# Patient Record
Sex: Female | Born: 1960 | Race: White | Hispanic: No | Smoking: Current every day smoker
Health system: Southern US, Community
[De-identification: ages and names within clinical notes are randomized; demographics above are authoritative.]

## PROBLEM LIST (undated history)

## (undated) DIAGNOSIS — Z720 Tobacco use: Secondary | ICD-10-CM

## (undated) DIAGNOSIS — M1990 Unspecified osteoarthritis, unspecified site: Secondary | ICD-10-CM

## (undated) DIAGNOSIS — Z5189 Encounter for other specified aftercare: Secondary | ICD-10-CM

## (undated) DIAGNOSIS — E785 Hyperlipidemia, unspecified: Secondary | ICD-10-CM

## (undated) DIAGNOSIS — I739 Peripheral vascular disease, unspecified: Secondary | ICD-10-CM

## (undated) DIAGNOSIS — G4730 Sleep apnea, unspecified: Secondary | ICD-10-CM

## (undated) DIAGNOSIS — E039 Hypothyroidism, unspecified: Secondary | ICD-10-CM

## (undated) DIAGNOSIS — K219 Gastro-esophageal reflux disease without esophagitis: Secondary | ICD-10-CM

## (undated) DIAGNOSIS — I10 Essential (primary) hypertension: Secondary | ICD-10-CM

## (undated) DIAGNOSIS — I2510 Atherosclerotic heart disease of native coronary artery without angina pectoris: Secondary | ICD-10-CM

## (undated) DIAGNOSIS — I509 Heart failure, unspecified: Secondary | ICD-10-CM

## (undated) DIAGNOSIS — D649 Anemia, unspecified: Secondary | ICD-10-CM

## (undated) HISTORY — DX: Gastro-esophageal reflux disease without esophagitis: K21.9

## (undated) HISTORY — DX: Sleep apnea, unspecified: G47.30

## (undated) HISTORY — DX: Heart failure, unspecified: I50.9

## (undated) HISTORY — DX: Atherosclerotic heart disease of native coronary artery without angina pectoris: I25.10

## (undated) HISTORY — DX: Tobacco use: Z72.0

## (undated) HISTORY — PX: COLONOSCOPY: SHX174

## (undated) HISTORY — DX: Encounter for other specified aftercare: Z51.89

## (undated) HISTORY — DX: Peripheral vascular disease, unspecified: I73.9

## (undated) HISTORY — DX: Essential (primary) hypertension: I10

## (undated) HISTORY — DX: Hyperlipidemia, unspecified: E78.5

## (undated) HISTORY — DX: Anemia, unspecified: D64.9

## (undated) HISTORY — DX: Hypothyroidism, unspecified: E03.9

## (undated) HISTORY — DX: Unspecified osteoarthritis, unspecified site: M19.90

---

## 2001-08-27 ENCOUNTER — Emergency Department (HOSPITAL_COMMUNITY): Admission: EM | Admit: 2001-08-27 | Discharge: 2001-08-27 | Payer: Self-pay | Admitting: Emergency Medicine

## 2006-08-23 ENCOUNTER — Emergency Department (HOSPITAL_COMMUNITY): Admission: EM | Admit: 2006-08-23 | Discharge: 2006-08-23 | Payer: Self-pay | Admitting: Emergency Medicine

## 2006-08-24 ENCOUNTER — Inpatient Hospital Stay (HOSPITAL_COMMUNITY): Admission: AD | Admit: 2006-08-24 | Discharge: 2006-08-29 | Payer: Self-pay | Admitting: *Deleted

## 2006-08-24 ENCOUNTER — Emergency Department (HOSPITAL_COMMUNITY): Admission: EM | Admit: 2006-08-24 | Discharge: 2006-08-25 | Payer: Self-pay | Admitting: Emergency Medicine

## 2006-08-24 ENCOUNTER — Ambulatory Visit: Payer: Self-pay | Admitting: *Deleted

## 2006-09-05 ENCOUNTER — Ambulatory Visit (HOSPITAL_COMMUNITY): Admission: RE | Admit: 2006-09-05 | Discharge: 2006-09-05 | Payer: Self-pay | Admitting: Family Medicine

## 2006-09-09 ENCOUNTER — Emergency Department (HOSPITAL_COMMUNITY): Admission: EM | Admit: 2006-09-09 | Discharge: 2006-09-09 | Payer: Self-pay | Admitting: Emergency Medicine

## 2006-09-13 ENCOUNTER — Ambulatory Visit (HOSPITAL_COMMUNITY): Payer: Self-pay | Admitting: Psychology

## 2006-09-26 ENCOUNTER — Ambulatory Visit (HOSPITAL_COMMUNITY): Payer: Self-pay | Admitting: Psychology

## 2006-10-07 ENCOUNTER — Ambulatory Visit (HOSPITAL_COMMUNITY): Admission: RE | Admit: 2006-10-07 | Discharge: 2006-10-07 | Payer: Self-pay | Admitting: Orthopaedic Surgery

## 2007-08-30 DIAGNOSIS — Z5189 Encounter for other specified aftercare: Secondary | ICD-10-CM

## 2007-08-30 DIAGNOSIS — IMO0001 Reserved for inherently not codable concepts without codable children: Secondary | ICD-10-CM

## 2007-08-30 HISTORY — DX: Encounter for other specified aftercare: Z51.89

## 2007-08-30 HISTORY — DX: Reserved for inherently not codable concepts without codable children: IMO0001

## 2007-09-11 ENCOUNTER — Ambulatory Visit: Payer: Self-pay | Admitting: *Deleted

## 2007-09-12 ENCOUNTER — Encounter: Payer: Self-pay | Admitting: Cardiology

## 2007-09-12 ENCOUNTER — Ambulatory Visit: Payer: Self-pay | Admitting: Pulmonary Disease

## 2007-09-12 ENCOUNTER — Inpatient Hospital Stay (HOSPITAL_COMMUNITY): Admission: EM | Admit: 2007-09-12 | Discharge: 2007-09-25 | Payer: Self-pay | Admitting: Cardiovascular Disease

## 2007-09-12 ENCOUNTER — Ambulatory Visit: Payer: Self-pay | Admitting: Cardiology

## 2007-09-19 ENCOUNTER — Encounter: Payer: Self-pay | Admitting: Cardiology

## 2007-09-21 ENCOUNTER — Encounter: Payer: Self-pay | Admitting: Cardiology

## 2007-09-22 ENCOUNTER — Encounter: Payer: Self-pay | Admitting: Cardiology

## 2007-09-22 ENCOUNTER — Ambulatory Visit: Payer: Self-pay | Admitting: Vascular Surgery

## 2007-09-25 ENCOUNTER — Encounter: Payer: Self-pay | Admitting: Cardiovascular Disease

## 2007-10-04 ENCOUNTER — Ambulatory Visit: Payer: Self-pay | Admitting: Cardiovascular Disease

## 2008-06-26 ENCOUNTER — Ambulatory Visit: Payer: Self-pay | Admitting: Cardiology

## 2008-06-27 ENCOUNTER — Encounter: Payer: Self-pay | Admitting: Cardiology

## 2008-06-27 ENCOUNTER — Ambulatory Visit: Payer: Self-pay | Admitting: Cardiology

## 2008-06-27 ENCOUNTER — Ambulatory Visit (HOSPITAL_COMMUNITY): Admission: RE | Admit: 2008-06-27 | Discharge: 2008-06-27 | Payer: Self-pay | Admitting: Cardiology

## 2008-09-19 ENCOUNTER — Ambulatory Visit: Payer: Self-pay | Admitting: Cardiology

## 2008-10-04 ENCOUNTER — Encounter (INDEPENDENT_AMBULATORY_CARE_PROVIDER_SITE_OTHER): Payer: Self-pay | Admitting: *Deleted

## 2008-10-04 LAB — CONVERTED CEMR LAB
BUN: 20 mg/dL
CO2: 24 meq/L
Creatinine, Ser: 0.86 mg/dL
Glucose, Bld: 79 mg/dL
Potassium: 3.7 meq/L

## 2008-10-08 ENCOUNTER — Encounter: Payer: Self-pay | Admitting: Cardiology

## 2008-10-11 ENCOUNTER — Encounter (INDEPENDENT_AMBULATORY_CARE_PROVIDER_SITE_OTHER): Payer: Self-pay | Admitting: *Deleted

## 2008-10-18 ENCOUNTER — Encounter (INDEPENDENT_AMBULATORY_CARE_PROVIDER_SITE_OTHER): Payer: Self-pay | Admitting: *Deleted

## 2009-01-09 ENCOUNTER — Encounter (INDEPENDENT_AMBULATORY_CARE_PROVIDER_SITE_OTHER): Payer: Self-pay | Admitting: *Deleted

## 2009-01-09 LAB — CONVERTED CEMR LAB
ALT: 73 units/L
AST: 45 units/L
Albumin: 4.3 g/dL
Alkaline Phosphatase: 59 units/L
CO2: 21 meq/L
Cholesterol: 192 mg/dL
Glucose, Bld: 106 mg/dL
HDL: 65 mg/dL
Sodium: 137 meq/L
Total Protein: 7.7 g/dL

## 2009-04-10 IMAGING — CR DG CHEST 1V PORT
1 series · 1 of 1 positions shown · non-contrast
Comparison: Chest radiograph 09/13/2007 at [DATE]

CLINICAL DATA: PICC line placement, pulmonary edema.

PORTABLE CHEST - 1 VIEW

[view not recorded]
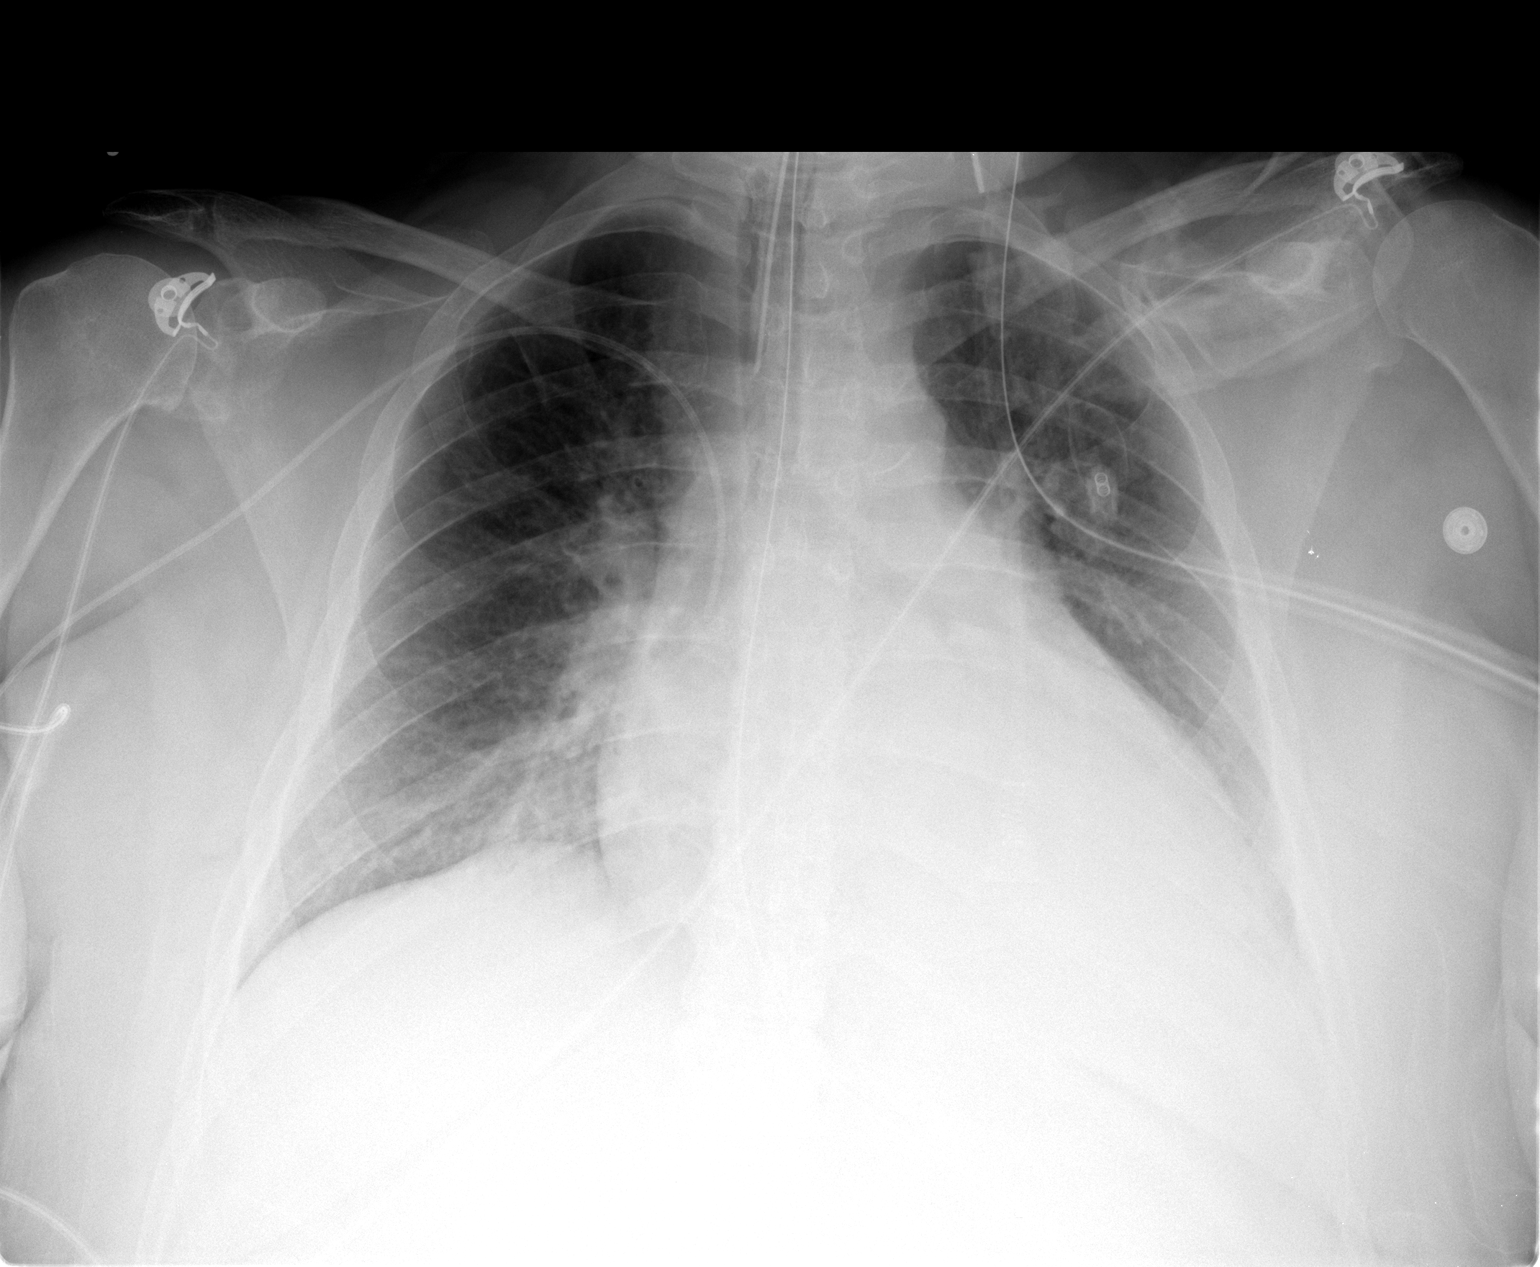

[1 of 1 positions shown; findings below may reference images not displayed]

FINDINGS: Interval placement of right PICC line with tip in the
distal SVC.  Endotracheal tube and NG tube are unchanged.  Stable
enlarged cardiac silhouette.  There is left retrocardiac opacity
representing atelectasis and/or effusion.  Improved central
pulmonary edema.
IMPRESSION: 1..  Interval placement of right PICC line with tip in the distal
SVC.
2..  Improved pulmonary edema.

## 2009-04-10 IMAGING — CR DG CHEST 1V PORT
1 series · 1 of 1 positions shown · non-contrast
Comparison: Portable chest 09/12/2007.

CLINICAL DATA: Acute pulmonary edema.

PORTABLE CHEST - 1 VIEW

[AP]
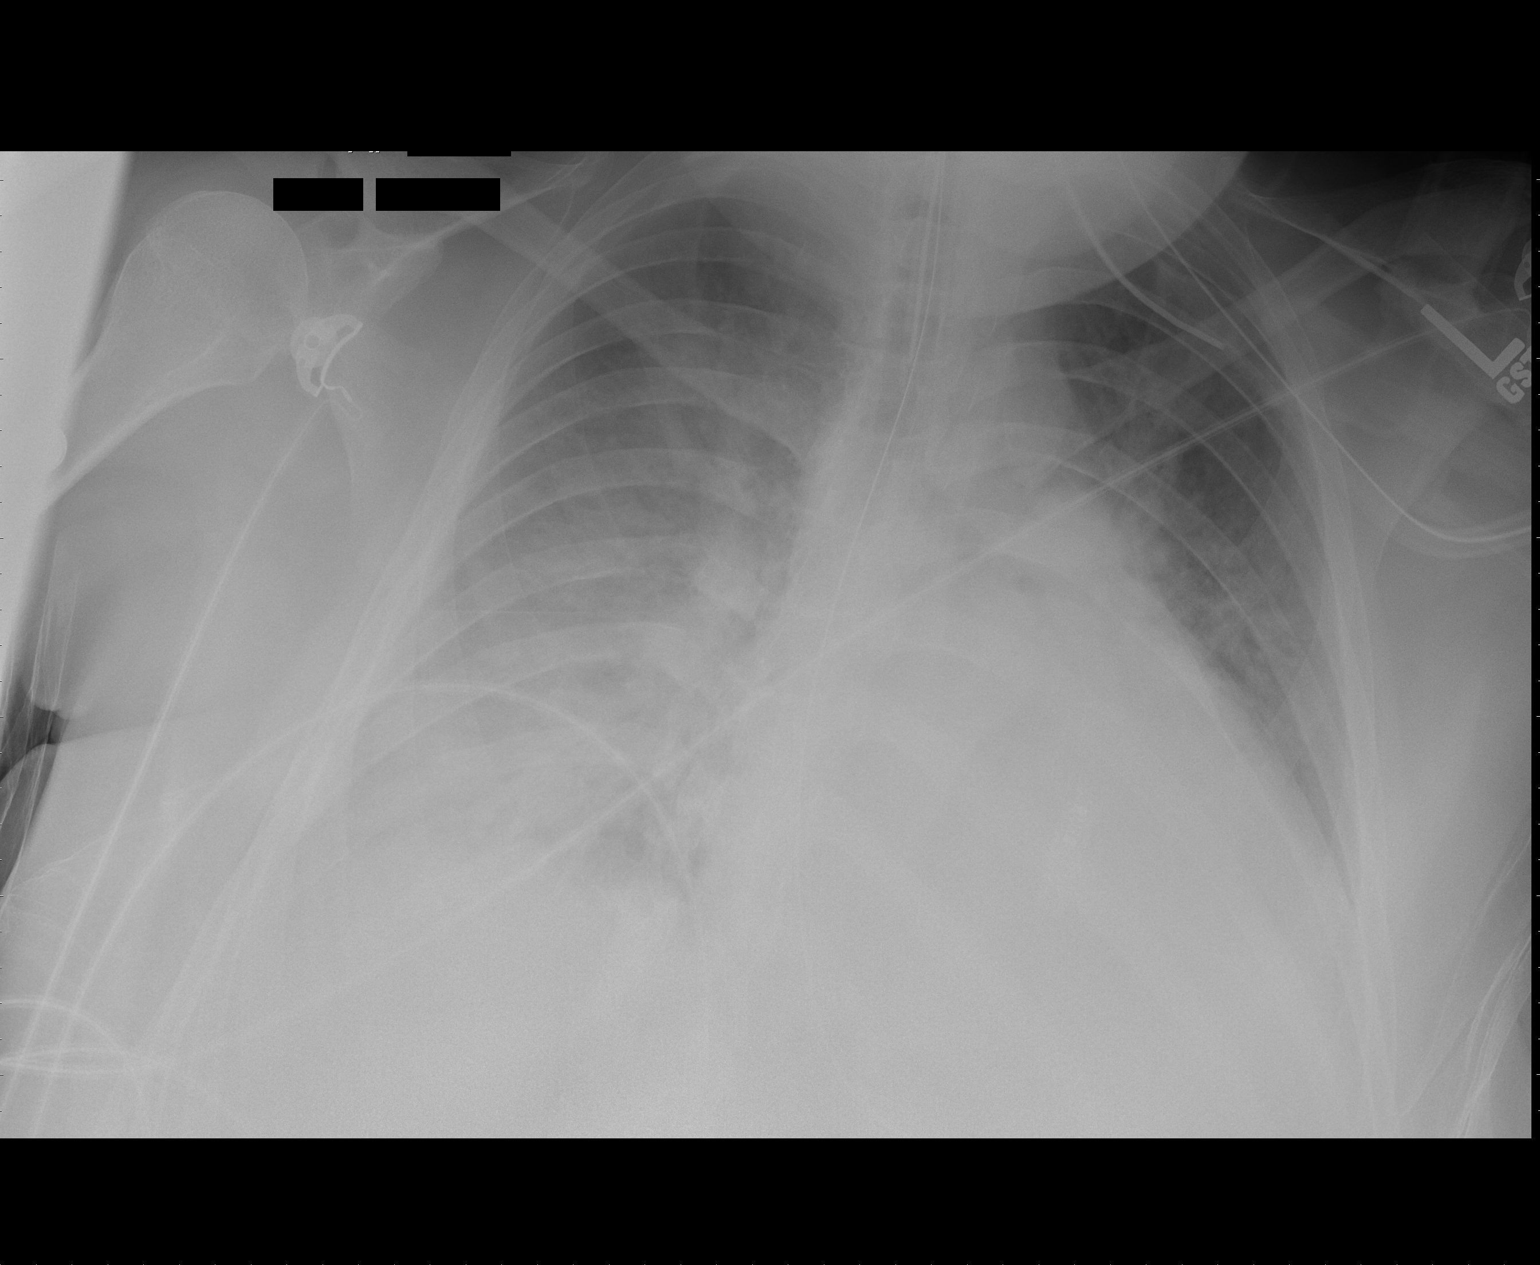

[1 of 1 positions shown; findings below may reference images not displayed]

FINDINGS: Support apparatus is unchanged.  There is been interval
increase in bilateral pleural effusions and airspace disease.  Left
basilar atelectasis again noted.
IMPRESSION: Increasing edema, effusions and atelectasis.

## 2009-04-13 IMAGING — CR DG CHEST 1V PORT
1 series · 1 of 1 positions shown · non-contrast
Comparison: 09/14/2007

CLINICAL DATA: Pulmonary edema

PORTABLE CHEST - 1 VIEW

[AP]
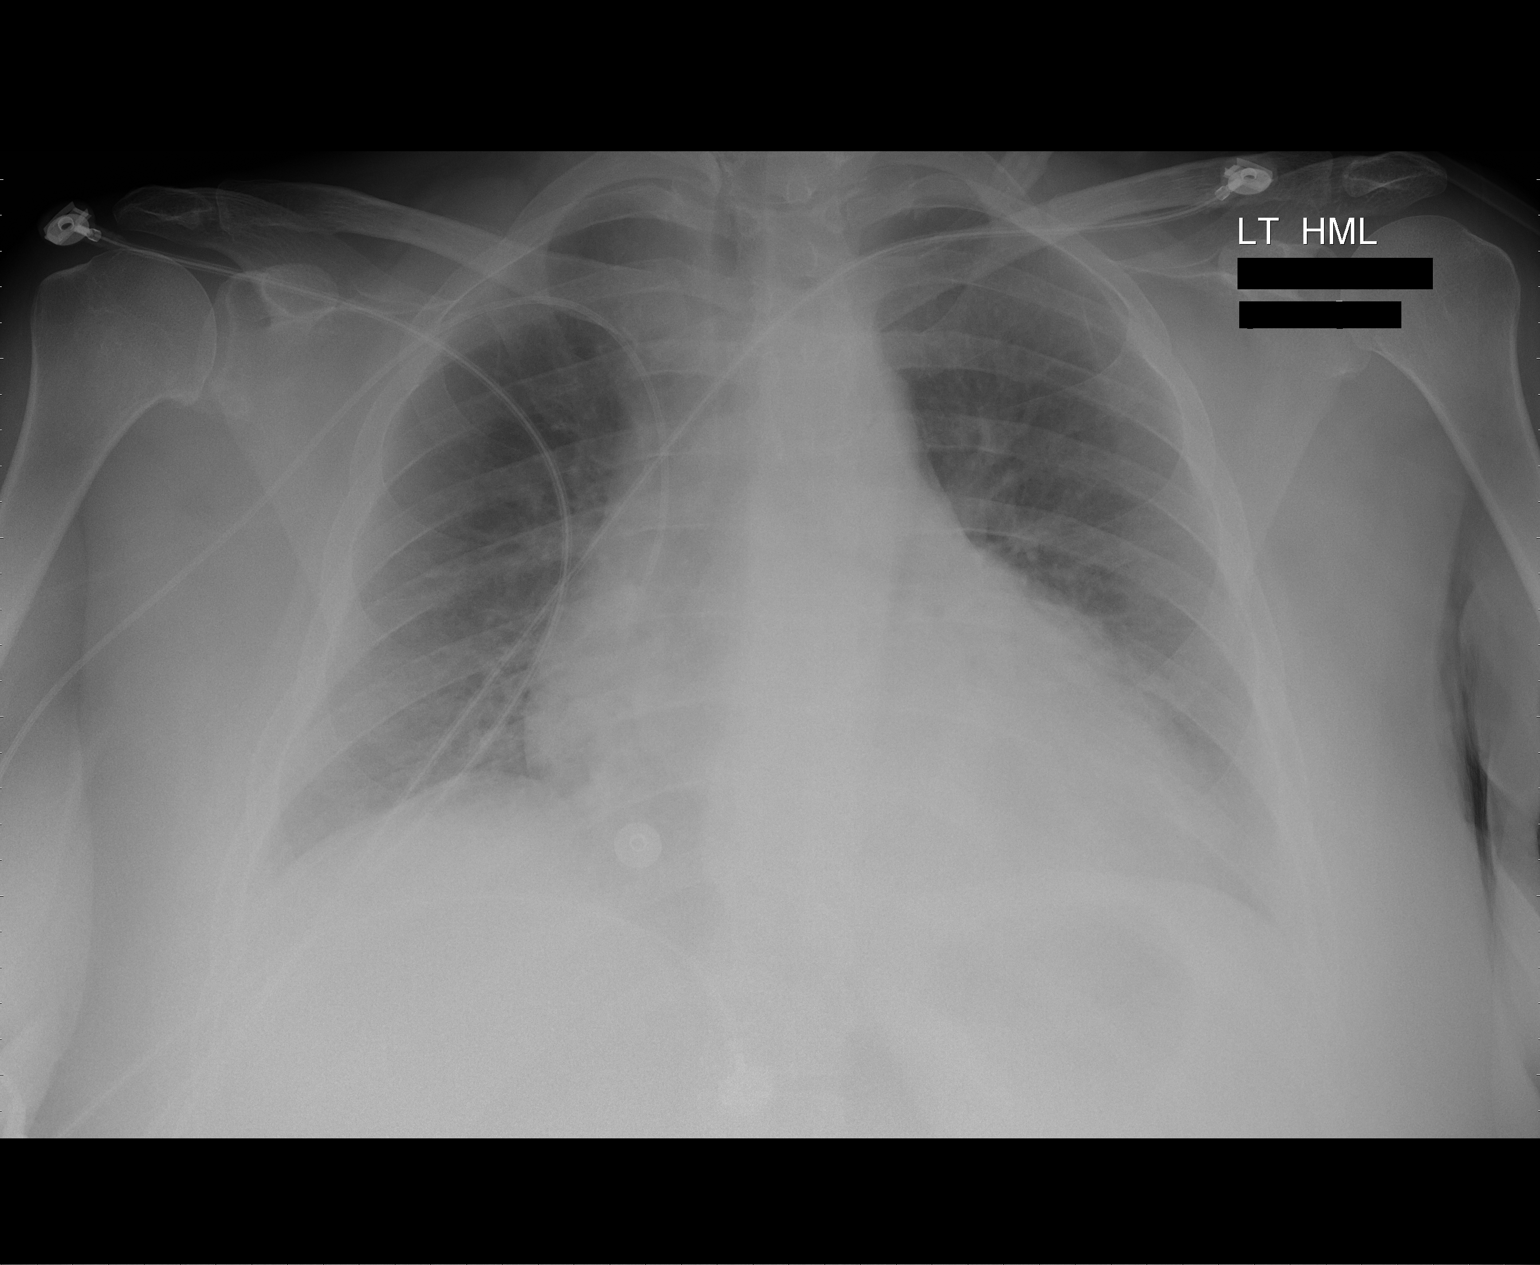

[1 of 1 positions shown; findings below may reference images not displayed]

FINDINGS: PICC catheter tip remains at the SVC right atrial
junction.  There is a fall extubation and removal of NG tube.
Cardiac enlargement redemonstrated.  Congestion without frank
edema.  Interval improved aeration
IMPRESSION: The patient is follow the extubation.  Improved aeration with
decreased atelectasis.  Cardiac enlargement is unchanged

## 2009-04-18 IMAGING — CR DG CHEST 2V
2 series · 2 of 2 positions shown · non-contrast
Comparison: 09/19/2007

CLINICAL DATA: Short of breath cough

CHEST - 2 VIEW

[w chest pa]
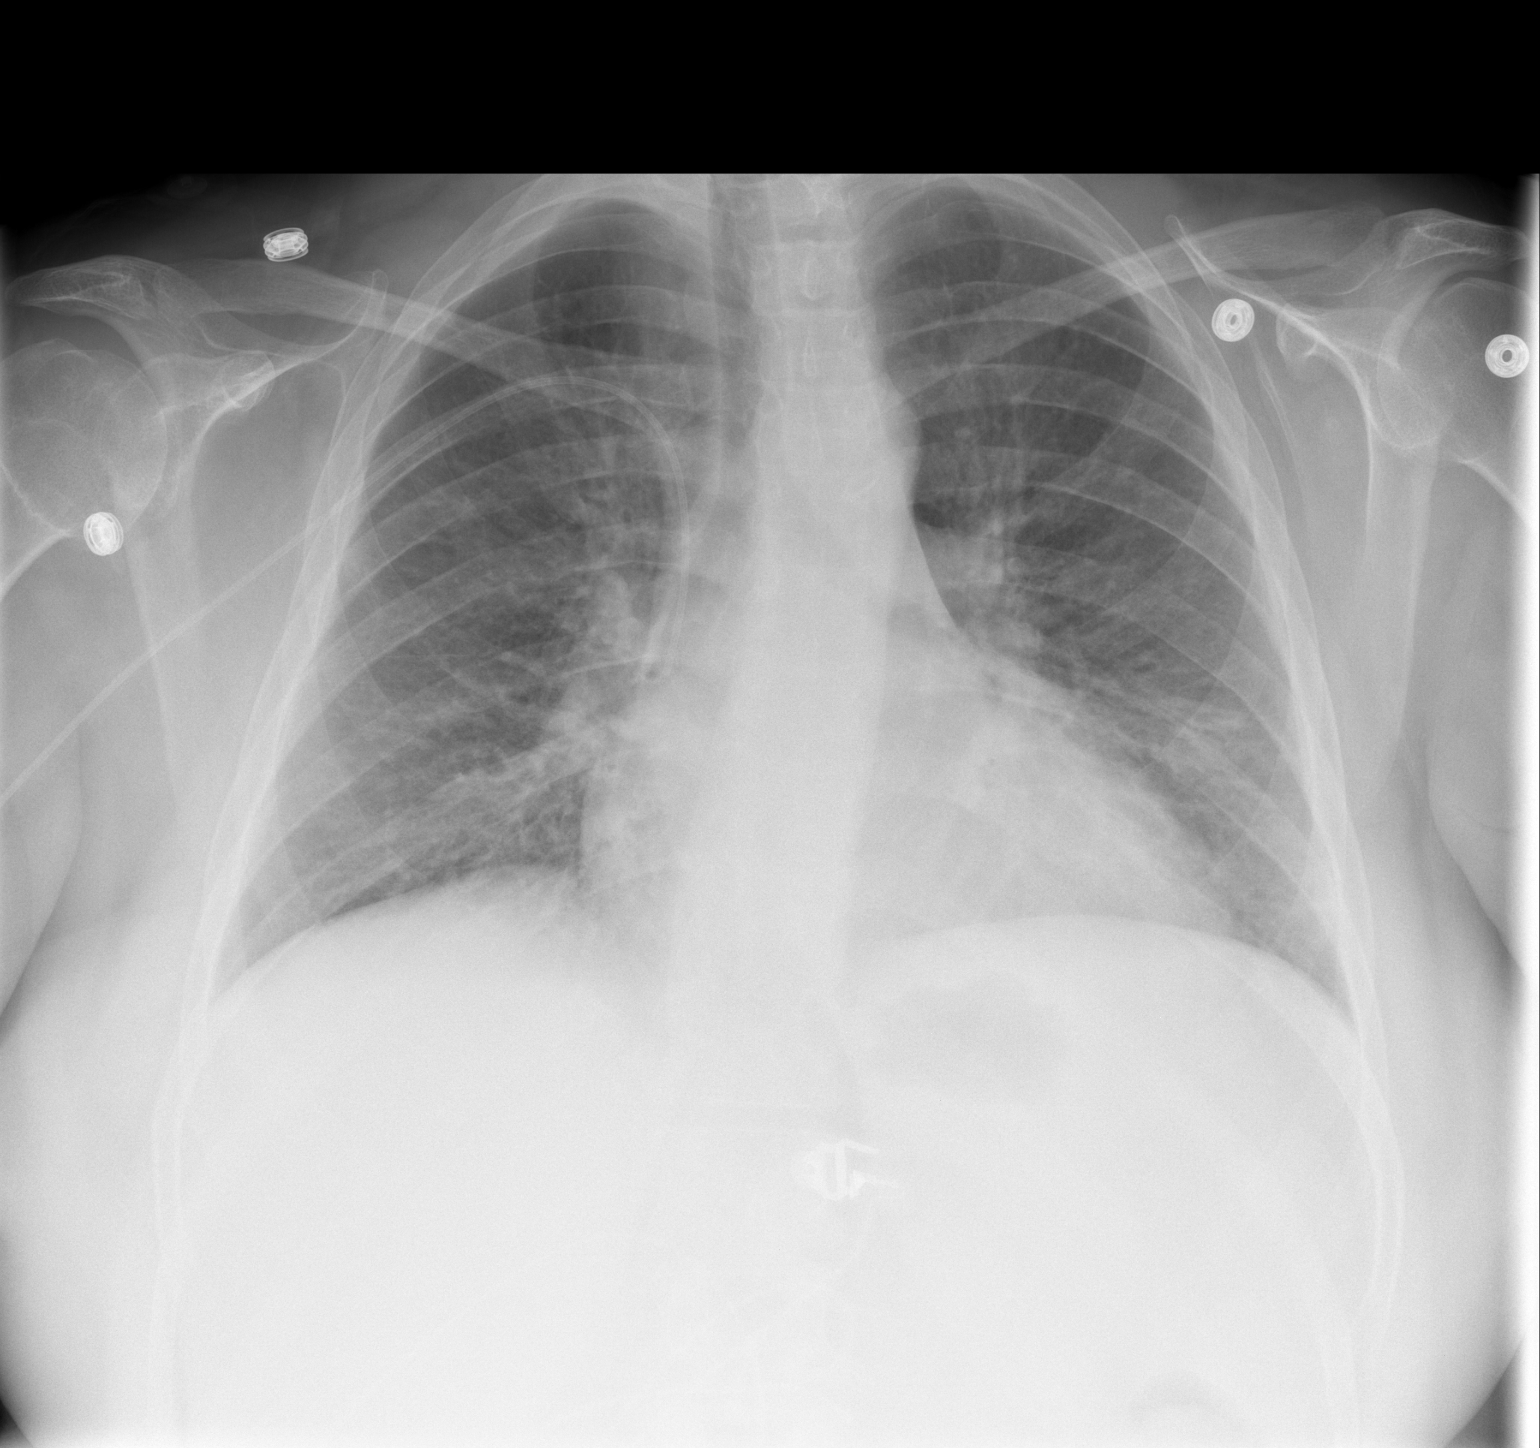

[w chest lat]
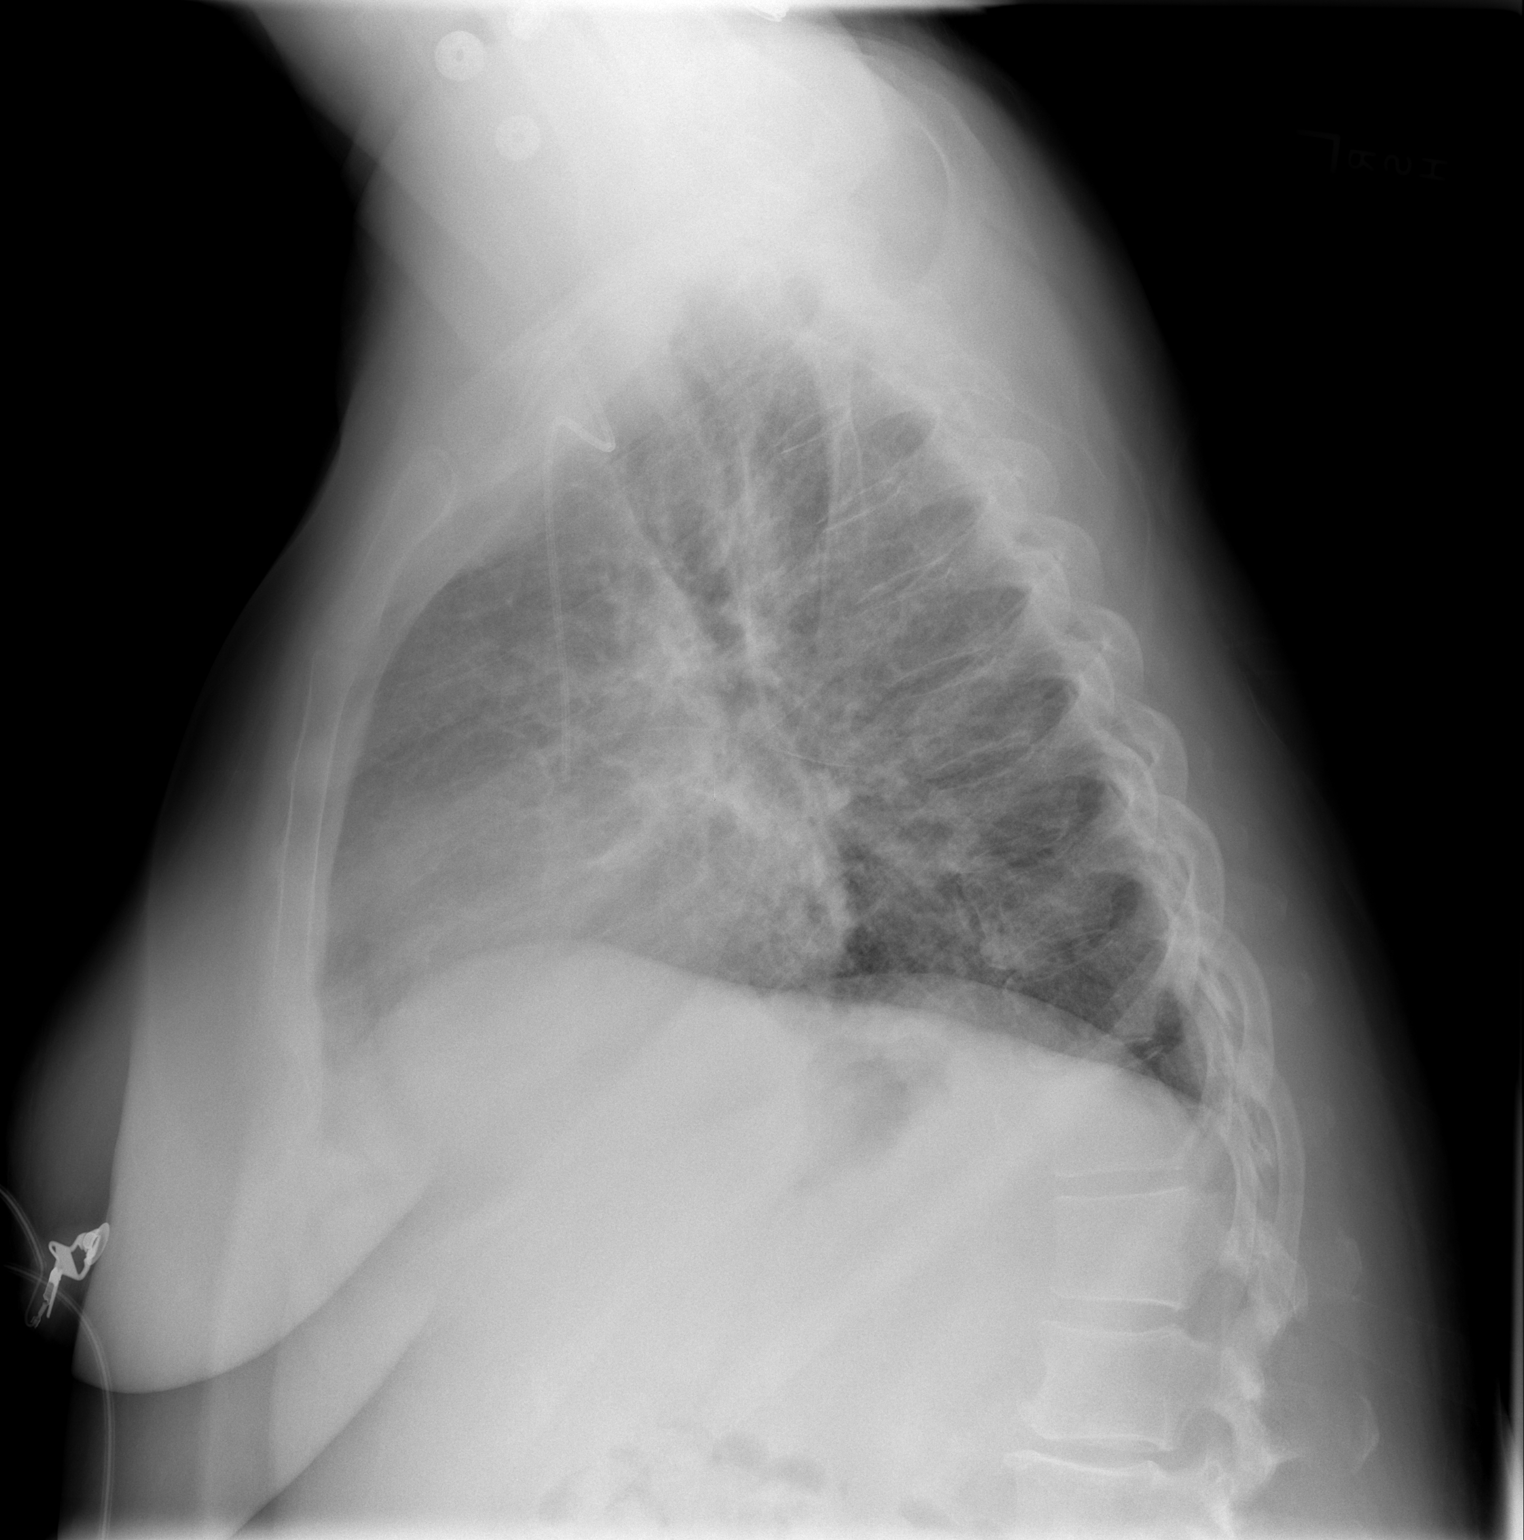

[2 of 2 positions shown; findings below may reference images not displayed]

FINDINGS: Right arm PICC line tip is in the SVC, unchanged.
Prominent interstitial markings are unchanged and are likely due to
some residual interstitial edema.  No baseline studies available to
determine if some this may be due to chronic lung disease.  There
is no effusion.  Mild increase in bibasilar atelectasis since
yesterday.
IMPRESSION: Prominent interstitial markings are again noted and are likely due
to interstitial edema.  There is mild increase in bibasilar
atelectasis since yesterday.

## 2009-05-19 ENCOUNTER — Ambulatory Visit (HOSPITAL_COMMUNITY): Admission: RE | Admit: 2009-05-19 | Discharge: 2009-05-19 | Payer: Self-pay | Admitting: Family Medicine

## 2009-06-03 DIAGNOSIS — E039 Hypothyroidism, unspecified: Secondary | ICD-10-CM | POA: Insufficient documentation

## 2009-06-03 DIAGNOSIS — F29 Unspecified psychosis not due to a substance or known physiological condition: Secondary | ICD-10-CM | POA: Insufficient documentation

## 2009-06-03 DIAGNOSIS — F39 Unspecified mood [affective] disorder: Secondary | ICD-10-CM | POA: Insufficient documentation

## 2009-06-03 DIAGNOSIS — I10 Essential (primary) hypertension: Secondary | ICD-10-CM | POA: Insufficient documentation

## 2009-06-03 DIAGNOSIS — G43009 Migraine without aura, not intractable, without status migrainosus: Secondary | ICD-10-CM | POA: Insufficient documentation

## 2009-06-04 ENCOUNTER — Encounter (INDEPENDENT_AMBULATORY_CARE_PROVIDER_SITE_OTHER): Payer: Self-pay | Admitting: *Deleted

## 2009-06-04 ENCOUNTER — Ambulatory Visit: Payer: Self-pay | Admitting: Cardiology

## 2009-06-04 DIAGNOSIS — I2510 Atherosclerotic heart disease of native coronary artery without angina pectoris: Secondary | ICD-10-CM

## 2009-06-04 DIAGNOSIS — E785 Hyperlipidemia, unspecified: Secondary | ICD-10-CM | POA: Insufficient documentation

## 2009-06-04 DIAGNOSIS — I252 Old myocardial infarction: Secondary | ICD-10-CM | POA: Insufficient documentation

## 2009-12-17 ENCOUNTER — Ambulatory Visit: Payer: Self-pay | Admitting: Cardiology

## 2009-12-17 DIAGNOSIS — F17200 Nicotine dependence, unspecified, uncomplicated: Secondary | ICD-10-CM | POA: Insufficient documentation

## 2009-12-17 DIAGNOSIS — E669 Obesity, unspecified: Secondary | ICD-10-CM

## 2009-12-17 DIAGNOSIS — Z6835 Body mass index (BMI) 35.0-35.9, adult: Secondary | ICD-10-CM

## 2009-12-29 ENCOUNTER — Telehealth (INDEPENDENT_AMBULATORY_CARE_PROVIDER_SITE_OTHER): Payer: Self-pay

## 2010-01-01 ENCOUNTER — Encounter (INDEPENDENT_AMBULATORY_CARE_PROVIDER_SITE_OTHER): Payer: Self-pay | Admitting: *Deleted

## 2010-01-01 ENCOUNTER — Encounter: Payer: Self-pay | Admitting: Cardiology

## 2010-01-01 ENCOUNTER — Ambulatory Visit: Payer: Self-pay | Admitting: Cardiology

## 2010-06-08 ENCOUNTER — Ambulatory Visit
Admission: RE | Admit: 2010-06-08 | Discharge: 2010-06-08 | Payer: Self-pay | Source: Home / Self Care | Attending: Cardiology | Admitting: Cardiology

## 2010-06-21 ENCOUNTER — Encounter: Payer: Self-pay | Admitting: Emergency Medicine

## 2010-06-30 NOTE — Assessment & Plan Note (Signed)
Summary: nurse visit for BP check per Lynn/tg  Nurse Visit   Vital Signs:  Patient profile:   50 year old female Height:      66 inches Weight:      249 pounds O2 Sat:      96 % on Room air Temp:     97.4 degrees F oral Pulse rate:   55 / minute Pulse rhythm:   sinus brady BP sitting:   129 / 74  (left arm)  Vitals Entered By: Teressa Lower RN (January 01, 2010 9:17 AM)  O2 Flow:  Room air  Visit Type:  nurse visit per phone note Primary Provider:  [facility]   History of Present Illness: ov on 12/17/09 increase carvedilol to 25mg  two times a day  and increase lisinopril to 40mg  daily pt was unable to get out of bed, dizzy and "drunk"  she stopped carvedilol 25mg  two times a day on 12/26/09, restarted 12.5mg  two times a day on 12/31/09 she stopped lisinopril 40mg  daily 12/23/09 nurse visit today pt stated she was feeling better, but still a little dizzy and lightheaded ekg shows sinus brady   Preventive Screening-Counseling & Management  Alcohol-Tobacco     Smoking Status: current     Packs/Day: 0.75  Current Medications (verified): 1)  Klor-Con 10 10 Meq Cr-Tabs (Potassium Chloride) .... Take 2 Tablets Daily 2)  Glipizide 5 Mg Tabs (Glipizide) .... Take 1 Tablet Bid 3)  Carvedilol 25 Mg Tabs (Carvedilol) .... Stopped 12/26/09    (Bid) 4)  Furosemide 20 Mg Tabs (Furosemide) .... Take 1 Tab Daily 5)  Levothroid 75 Mcg Tabs (Levothyroxine Sodium) .... Take 1&1/2 Tablet By Mouth Once A Day 6)  Ranitidine Hcl 300 Mg Caps (Ranitidine Hcl) .... Take 1 Tab Daily 7)  Pravachol 40 Mg Tabs (Pravastatin Sodium) .... Take 1 Tab Daily 8)  Lisinopril 40 Mg Tabs (Lisinopril) .... Stopped Taking 12/23/09 ( Once Daily) 9)  Nitrostat 0.4 Mg Subl (Nitroglycerin) .... Use As Directed For Chest Pain Up To 3 Doses Every 74minutes,if No Relief Proceed To Ed 10)  Carvedilol 12.5 Mg Tabs (Carvedilol) .... Restarted 12/31/09 Take One Tablet By Mouth Twice A Day  Allergies  (verified): No Known Drug Allergies

## 2010-06-30 NOTE — Assessment & Plan Note (Signed)
Summary: ROV   Visit Type:  Follow-up Primary Provider:  Timmie Foerster   History of Present Illness: [name] comes in today for further evaluation and management of her coronary artery disease, history of LV systolic dysfunction that is now improved to an ejection fraction of 55% by echocardiogram in February of this year, hypertension, mixed hyperlipidemia, obesity, and tobacco use.  We are treating her coronary disease medically.  Recent blood work shows that she is improved her compliance with her medications. Specifically, her lipids were at goal except her HDL and triglycerides. She is taking Pravachol.  She has some dyspneaexertion and her legs give out. She denies any claudication.  Current Medications (verified): 1)  Klor-Con 10 10 Meq Cr-Tabs (Potassium Chloride) .... Take 2 Tablets Daily 2)  Glipizide 5 Mg Tabs (Glipizide) .... Take 1 Tablet Bid 3)  Carvedilol 12.5 Mg Tabs (Carvedilol) .... Take One Tablet By Mouth Twice A Day 4)  Furosemide 20 Mg Tabs (Furosemide) .... Take 1 Tab Daily 5)  Levothroid 75 Mcg Tabs (Levothyroxine Sodium) .... Take 1 Tab Daily 6)  Ranitidine Hcl 300 Mg Caps (Ranitidine Hcl) .... Take 1 Tab Daily 7)  Pravachol 40 Mg Tabs (Pravastatin Sodium) .... Take 1 Tab Daily 8)  Lisinopril 20 Mg Tabs (Lisinopril) .... 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  obese.   Head:  normocephalic and atraumatic Eyes:  PERRLA/EOM intact; conjunctiva and lids normal. Lungs:  Clear bilaterally to auscultation and percussion. Heart:  p appreciated PMI, normal S1-S2, no gallop Msk:  Back normal, normal gait. Muscle strength and tone normal. Pulses:  pulses normal in all 4 extremities Extremities:  trace left pedal edema and trace right pedal edema.  trace left pedal edema and trace right pedal edema.   Neurologic:  Alert and oriented x 3. Skin:  Intact without lesions or rashes. Psych:  Normal affect.   Problems:  Medical Problems Added: 1)  Dx of Hyperlipidemia-mixed  (ICD-272.4) 2)  Dx of Old Myocardial Infarction  (ICD-412) 3)  Dx of Cad, Native Vessel  (ICD-414.01)  Impression & Recommendations:  Problem # 1:  CAD, NATIVE VESSEL (ICD-414.01) Assessment Unchanged  Her updated medication list for this problem includes:    Carvedilol 12.5 Mg Tabs (Carvedilol) .Marland Kitchen... Take one tablet by mouth twice a day    Lisinopril 20 Mg Tabs (Lisinopril) .Marland Kitchen... Take 1 tab daily  Problem # 2:  OLD MYOCARDIAL INFARCTION (ICD-412)  Her updated medication list for this problem includes:    Carvedilol 12.5 Mg Tabs (Carvedilol) .Marland Kitchen... Take one tablet by mouth twice a day    Lisinopril 20 Mg Tabs (Lisinopril) .Marland Kitchen... Take 1 tab daily  Problem # 3:  HYPERTENSION (ICD-401.9) Assessment: Deteriorated her blood pressure is too high as  is her heart rate. I will increase her carvedilol 12/2 mg twice a day. She's been encouraged to lose weight and to stop smoking Her updated medication list for this problem includes:    Carvedilol 12.5 Mg Tabs (Carvedilol) .Marland Kitchen... Take one tablet by mouth twice a day    Furosemide 20 Mg Tabs (Furosemide) .Marland Kitchen... Take 1 tab daily    Lisinopril 20 Mg Tabs (Lisinopril) .Marland Kitchen... Take 1 tab daily  Problem # 4:  HYPERLIPIDEMIA-MIXED (ICD-272.4) Assessment:  Improved  Her updated medication list for this problem includes:    Pravachol 40 Mg Tabs (Pravastatin sodium) .Marland Kitchen... Take 1 tab daily  Patient Instructions: 1)  Your physician recommends that you schedule a follow-up appointment in: 6 months 2)  Your physician has recommended you make the following change in your medication:  increase carvedilol to 12.5mg  two times a day Prescriptions: CARVEDILOL 12.5 MG TABS (CARVEDILOL) Take one tablet by mouth twice a day  #60 x 6   Entered by:   Teressa Lower RN   Authorized by:   Gaylord Shih, MD, [facility]   Signed by:   Teressa Lower RN on 06/04/2009   Method used:   Print then Give to Patient   RxID:   [id number]

## 2010-06-30 NOTE — Miscellaneous (Signed)
Summary: medication reconciliation  Clinical Lists Changes  Medications: Removed medication of CARVEDILOL 25 MG TABS (CARVEDILOL) stopped 12/26/09    (bid) Removed medication of LISINOPRIL 40 MG TABS (LISINOPRIL) stopped taking 12/23/09 ( once daily)

## 2010-06-30 NOTE — Progress Notes (Signed)
**Note De-Identified [name] Obfuscation** Summary: REACTION TO MEDS  Phone Note Call from Patient Call back at Home Phone [phone]   Caller: PT Reason for Call: Talk to Nurse Summary of Call: S: PT IS NOW ON CARVEDILOL 20MG , LISINOPRIL 40MG  AS OF 12/17/09 AND SHE CANNOT TAKE THEM THEY ARE MAKING HER DRUNK AND GIVING HER DIARREAH. Initial call taken by: Faythe Ghee,  December 29, 2009 9:53 AM  Follow-up for Phone Call        B: On last OV of 12-17-09, Dr. Daleen Squibb increased Carvedilol to 25mg  two times a day from 12.5  two times a day and Lisinopril to 40mg  once daily from 20mg  once daily.  A: Pt. states she has had diarrhea and been very dizzy and feeling drunk since the increase in meds. R: Pt. advised to be careful while mobile and we will call her with Dr. Vern Claude recommendations.  Follow-up by: Larita Fife Darrell Hauk LPN,  December 29, 2009 11:23 AM  Additional Follow-up for Phone Call Additional follow up Details #1::        Have come in for nurse visit for bp check. If still high increase Lisinopril to 40 and leave Carvedilol at 12.5 two times a day. Additional Follow-up by: Gaylord Shih, MD, [facility],  December 30, 2009 9:26 AM     Appended Document: REACTION TO MEDS    Phone Note Outgoing Call   Call placed by: Larita Fife Emery Binz LPN,  December 30, 2009 9:53 AM Request: Send information Summary of Call: [facility] Needs nurse visit/BP check per Dr. Daleen Squibb  Follow-up for Phone Call        BP check/nurse visit scheduled for 01-01-10, pt. asked to check BP in mornings and in evenings and to bring readings to BP check. Pt. wants to know if meds should be decreased due to her dizziness. She states she is afraid to drive or even leave home. Please advise. Follow-up by: Larita Fife Sharmon Cheramie LPN,  December 30, 2009 10:48 AM      Appended Document: REACTION TO MEDS see my response this am.

## 2010-06-30 NOTE — Miscellaneous (Signed)
Summary: rx refill  Clinical Lists Changes  Medications: Added new medication of KLOR-CON 10 10 MEQ CR-TABS (POTASSIUM CHLORIDE) take 2 tablets daily - Signed Rx of KLOR-CON 10 10 MEQ CR-TABS (POTASSIUM CHLORIDE) take 2 tablets daily;  #60 x 6;  Signed;  Entered by: Hulan Fess, RN;  Authorized by: Kathlen Brunswick, MD, [facility];  Method used: Electronically to [facility] [address] [place], [place], [place]  [address], Ph: [phone], Fax: [phone]    Prescriptions: KLOR-CON 10 10 MEQ CR-TABS (POTASSIUM CHLORIDE) take 2 tablets daily  #60 x 6   Entered by:   Hulan Fess, RN   Authorized by:   Kathlen Brunswick, MD, [facility]   Signed by:   Hulan Fess, RN on 10/08/2008   Method used:   Electronically to        [facility]  [address] (retail)       [address]       [place], [place]  [address]       Ph: [phone]       Fax: [phone]   RxID:   [id number]

## 2010-06-30 NOTE — Assessment & Plan Note (Signed)
Summary: Y7W   Visit Type:  Follow-up Primary Provider:  [facility] Department  CC:  6 month f/u.  History of Present Illness: Ms Cline returns today for evaluation and management of her coronary artery disease, and normal left ventricular systolic function, hypertension, mixed hyperlipidemia, obesity, and tobacco use.  Unfortunately, she continues to smoke about three quarters of a pack of cigarettes a day. Her weight has not dropped. She does not check her blood sugar on a regular basis. All these once again had been reinforced.  She safely taking her medications.  Other than dyspnea on exertion which is baseline, but no new complaints. She specifically denies any angina or chest pain.    Preventive Screening-Counseling & Management  Alcohol-Tobacco     Smoking Status: current     Smoking Cessation Counseling: yes     Packs/Day: 3/4 PPD  Current Medications (verified): 1)  Klor-Con 10 10 Meq Cr-Tabs (Potassium Chloride) .... Take 2 Tablets Daily 2)  Glipizide 5 Mg Tabs (Glipizide) .... Take 1 Tablet Bid 3)  Carvedilol 25 Mg Tabs (Carvedilol) .... Take 1 Tablet By Mouth Two Times A Day 4)  Furosemide 20 Mg Tabs (Furosemide) .... Take 1 Tab Daily 5)  Levothroid 75 Mcg Tabs (Levothyroxine Sodium) .... Take 1&1/2 Tablet By Mouth Once A Day 6)  Ranitidine Hcl 300 Mg Caps (Ranitidine Hcl) .... Take 1 Tab Daily 7)  Pravachol 40 Mg Tabs (Pravastatin Sodium) .... Take 1 Tab Daily 8)  Lisinopril 40 Mg Tabs (Lisinopril) .... Take 1 Tablet By Mouth Once Daily 9)  Nitrostat 0.4 Mg Subl (Nitroglycerin) .... Use As Directed For Chest Pain Up To 3 Doses Every 37minutes,if No Relief Proceed To Ed  Allergies (verified): No Known Drug Allergies  Comments:  Nurse/Medical Assistant: The patient's medication bottles and allergies were reviewed with the patient and were updated in the Medication and Allergy Lists.  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) HYPERTENSION (ICD-401.9) HYPOTHYROIDISM (ICD-244.9) COMMON MIGRAINE (ICD-346.10)  Past Surgical History: Last updated: 06/03/2009 no surgical history noted  Risk Factors: Smoking Status: current (12/17/2009) Packs/Day: 3/4 PPD (12/17/2009)  Social History: Smoking Status:  current Packs/Day:  3/4 PPD  Review of Systems       negative other than history of present illness  Vital Signs:  Patient profile:   50 year old female Height:      66 inches Weight:      258 pounds Pulse rate:   70 / minute BP sitting:   145 / 90  (left arm) Cuff size:   large  Vitals Entered By: Carlye Grippe (December 17, 2009 8:42 AM) CC: 6 month f/u   Physical Exam  General:  no acute distress, morbidly obese Head:  normocephalic and atraumatic Eyes:  PERRLA/EOM intact; conjunctiva and lids normal. Mouth:  poor dentition.  poor dentition.   Neck:  Neck supple, no JVD. No masses, thyromegaly or abnormal cervical nodes. Chest Emelin Dascenzo:  no deformities or breast masses noted Lungs:  Clear bilaterally to auscultation and percussion. Heart:  Non-displaced PMI, chest non-tender; regular rate and rhythm, S1, S2 without murmurs, rubs or gallops. Carotid upstroke normal, no bruit. Normal abdominal aortic size, no bruits. Femorals normal pulses, no bruits. Pedals normal pulses. No edema, no varicosities. Msk:  Back normal, normal gait. Muscle strength and tone normal. Pulses:  pulses normal in all 4 extremities Extremities:  No clubbing or cyanosis. Neurologic:  Alert and oriented x  3. Skin:  Intact without lesions or rashes. Psych:  Normal affect.   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 Take 1 tablet by mouth two times a day    Lisinopril 40 Mg Tabs (Lisinopril) .Marland Kitchen... Take 1 tablet by mouth once daily    Nitrostat 0.4 Mg Subl (Nitroglycerin) ..... Use as directed for chest pain up to 3 doses every 82minutes,if no relief proceed to ed  Orders: EKG w/ Interpretation (93000)  Her updated medication list for this problem includes:    Carvedilol 25 Mg Tabs (Carvedilol) .Marland Kitchen... Take 1 tablet by mouth two times a day    Lisinopril 40 Mg Tabs (Lisinopril) .Marland Kitchen... Take 1 tablet by mouth once daily    Nitrostat 0.4 Mg Subl (Nitroglycerin) ..... Use as directed for chest pain up to 3 doses every 21minutes,if no relief proceed to ed  Problem # 2:  OLD MYOCARDIAL INFARCTION (ICD-412) Assessment: Unchanged  Her updated medication list for this problem includes:    Carvedilol 25 Mg Tabs (Carvedilol) .Marland Kitchen... Take 1 tablet by mouth two times a day    Lisinopril 40 Mg Tabs (Lisinopril) .Marland Kitchen... Take 1 tablet by mouth once daily    Nitrostat 0.4 Mg Subl (Nitroglycerin) ..... Use as directed for chest pain up to 3 doses every 52minutes,if no relief proceed to ed  Her updated medication list for this problem includes:    Carvedilol 25 Mg Tabs (Carvedilol) .Marland Kitchen... Take 1 tablet by mouth two times a day    Lisinopril 40 Mg Tabs (Lisinopril) .Marland Kitchen... Take 1 tablet by mouth once daily    Nitrostat 0.4 Mg Subl (Nitroglycerin) ..... Use as directed for chest pain up to 3 doses every 1minutes,if no relief proceed to ed  Problem # 3:  HYPERTENSION (ICD-401.9) Assessment: Deteriorated  Her updated medication list for this problem includes:    Carvedilol 25 Mg Tabs (Carvedilol) .Marland Kitchen... Take 1 tablet by mouth two times a day    Furosemide 20 Mg Tabs (Furosemide) .Marland Kitchen... Take 1 tab daily    Lisinopril 40 Mg Tabs  (Lisinopril) .Marland Kitchen... Take 1 tablet by mouth once daily  Her updated medication list for this problem includes:    Carvedilol 25 Mg Tabs (Carvedilol) .Marland Kitchen... Take 1 tablet by mouth two times a day    Furosemide 20 Mg Tabs (Furosemide) .Marland Kitchen... Take 1 tab daily    Lisinopril 40 Mg Tabs (Lisinopril) .Marland Kitchen... Take 1 tablet by mouth once daily  Problem # 4:  HYPERLIPIDEMIA-MIXED (ICD-272.4)  Her updated medication list for this problem includes:    Pravachol 40 Mg Tabs (Pravastatin sodium) .Marland Kitchen... Take 1 tab daily  Future Orders: T-Comprehensive Metabolic Panel ([id number]) ... 12/29/2009 T-Hepatic Function [id number]) ... 12/29/2009  Her updated medication list for this problem includes:    Pravachol 40 Mg Tabs (Pravastatin sodium) .Marland Kitchen... Take 1 tab daily  Problem # 5:  OBESITY-MORBID (>100') (ICD-278.01) Assessment: Unchanged I have given a long lecture today on losing weight or at least not gaining more weight. Her vascular check her blood sugar on regular basis. We'll check  a TSH when she returns as well. Staying active is also emphasized.  Problem # 6:  TOBACCO ABUSE (ICD-305.1) counseled once again to quit  Other Orders: Future Orders: T-TSH ([id number]) ... 12/29/2009  Patient Instructions: 1)  Your physician recommends that you schedule a follow-up appointment in: 6 months 2)  Your physician recommends that you return for lab work in: 10 DAYS 3)  Your physician has recommended you make the following change in your medication: Increase Carvedilol to 25mg  by mouth two times a day and Lisinopril to 40mg  by mouth once daily  4)  Your physician discussed the hazards of tobacco use.  Tobacco use cessation is recommended and techniques and options to help you quit were discussed. Prescriptions: CARVEDILOL 25 MG TABS (CARVEDILOL) take 1 tablet by mouth two times a day  #60 x 5   Entered by:   Larita Fife Via LPN   Authorized by:   Gaylord Shih, MD, [facility]   Signed by:   Larita Fife Via LPN on 30/86/5784    Method used:   Electronically to        [facility]  [address] (retail)       [address]       [place], [place]  [address]       Ph: [phone]       Fax: [phone]   RxID:   [id number] LISINOPRIL 40 MG TABS (LISINOPRIL) take 1 tablet by mouth once daily  #30 x 5   Entered by:   Larita Fife Via LPN   Authorized by:   Gaylord Shih, MD, [facility]   Signed by:   Larita Fife Via LPN on 43/32/9518   Method used:   Electronically to        [facility]  [address] (retail)       [address]       [place], [place]  [address]       Ph: [phone]       Fax: [phone]   RxID:   [id number]

## 2010-06-30 NOTE — Miscellaneous (Signed)
Summary: labs cmp,lipids,tsh,01/09/2009  Clinical Lists Changes  Observations: Added new observation of CALCIUM: 9.3 mg/dL (57/32/2025 42:70) Added new observation of ALBUMIN: 4.3 g/dL (62/37/6283 15:17) Added new observation of PROTEIN, TOT: 7.7 g/dL (61/60/7371 06:26) Added new observation of SGPT (ALT): 73 units/L (01/09/2009 11:52) Added new observation of SGOT (AST): 45 units/L (01/09/2009 11:52) Added new observation of ALK PHOS: 59 units/L (01/09/2009 11:52) Added new observation of BUN: 10 mg/dL (94/85/4627 03:50) Added new observation of BG RANDOM: 106 mg/dL (09/38/1829 93:71) Added new observation of CO2 PLSM/SER: 21 meq/L (01/09/2009 11:52) Added new observation of CL SERUM: 104 meq/L (01/09/2009 11:52) Added new observation of K SERUM: 4.4 meq/L (01/09/2009 11:52) Added new observation of NA: 137 meq/L (01/09/2009 11:52) Added new observation of LDL: 74 mg/dL (69/67/8938 10:17) Added new observation of HDL: 65 mg/dL (51/07/5850 77:82) Added new observation of TRIGLYC TOT: 265 mg/dL (42/35/3614 43:15) Added new observation of CHOLESTEROL: 192 mg/dL (40/12/6759 95:09) Added new observation of TSH: 1.797 microintl units/mL (01/09/2009 11:52) Added new observation of CALCIUM: 9.4 mg/dL (32/67/1245 80:99) Added new observation of CREATININE: 0.86 mg/dL (83/38/2505 39:76) Added new observation of BUN: 20 mg/dL (73/41/9379 02:40) Added new observation of BG RANDOM: 79 mg/dL (97/35/3299 24:26) Added new observation of CO2 PLSM/SER: 24 meq/L (10/04/2008 11:52) Added new observation of CL SERUM: 103 meq/L (10/04/2008 11:52) Added new observation of K SERUM: 3.7 meq/L (10/04/2008 11:52) Added new observation of NA: 140 meq/L (10/04/2008 11:52)

## 2010-07-02 NOTE — Assessment & Plan Note (Signed)
Summary: Z6X   Visit Type:  Follow-up Primary Provider:  Dr.Tom Pickard [facility]   History of Present Illness: [name] is a 50 y/o CF who we are following for continueed assessment and treatment of CAD, mixed hyperlipidemia, and hypertension.  She also has a history of tobacco abuse, obesity and diabetes.  Most recent cardiac cath was completed in 2009: Three vessel CAD with totally occluded RCA.  She is being treated medically as the cathing cardiologist did not feel PCI would be appropriate at that time.  She is without complaint today.  She unfortunatley continues to smoke.  She is followed by Dr. Tanya Nones for blood work to include cholesterol status.  Current Medications (verified): 1)  Klor-Con 10 10 Meq Cr-Tabs (Potassium Chloride) .... Take 2 Tablets Daily 2)  Glipizide 10 Mg Tabs (Glipizide) .... Take 1 Tab Two Times A Day 3)  Furosemide 20 Mg Tabs (Furosemide) .... Take 1 Tab Daily 4)  Levothyroxine Sodium 150 Mcg Tabs (Levothyroxine Sodium) .... Take 1 Tab Daily 5)  Ranitidine Hcl 300 Mg Caps (Ranitidine Hcl) .... Take 1 Tab Daily 6)  Pravachol 40 Mg Tabs (Pravastatin Sodium) .... Take 1 Tab Daily 7)  Nitrostat 0.4 Mg Subl (Nitroglycerin) .... Use As Directed For Chest Pain Up To 3 Doses Every 79minutes,if No Relief Proceed To Ed 8)  Carvedilol 25 Mg Tabs (Carvedilol) .... Take 1 Tab Two Times A Day 9)  Aspirin 325 Mg Tabs (Aspirin) .... Take 1 Tab Daily  Allergies (verified): No Known Drug Allergies  Comments:  Nurse/Medical Assistant: patient brought meds she uses [facility] in [place]  Past History:  Past medical, surgical, family and social histories (including risk factors) reviewed, and no changes noted (except as noted below).  Past Medical History: Reviewed history from 06/03/2009 and no changes required. Current Problems:  PSYCHOSIS (ICD-298.9) MOOD DISORDER (ICD-296.90) HYPERTENSION (ICD-401.9) HYPOTHYROIDISM (ICD-244.9) COMMON  MIGRAINE (ICD-346.10)  Past Surgical History: Reviewed history from 06/03/2009 and no changes required. no surgical history noted  Family History: Reviewed history and no changes required.  Social History: Reviewed history and no changes required.  Review of Systems       All other systems have been reviewed and are negative unless stated above.   Vital Signs:  Patient profile:   50 year old female Weight:      253 pounds BMI:     40.98 O2 Sat:      95 % on Room air Pulse rate:   66 / minute BP sitting:   149 / 81  (left arm)  Vitals Entered By: Dreama Saa, CNA (June 08, 2010 11:24 AM)  O2 Flow:  Room air  Physical Exam  General:  Well developed, well nourished, in no acute distress. Head:  normocephalic and atraumatic Eyes:  PERRLA/EOM intact; conjunctiva and lids normal. Lungs:  Clear bilaterally to auscultation and percussion. Heart:  Non-displaced PMI, chest non-tender; regular rate and rhythm, S1, S2 without murmurs, rubs or gallops. Carotid upstroke normal, no bruit. Normal abdominal aortic size, no bruits. Femorals normal pulses, no bruits. Pedals normal pulses. No edema, no varicosities. Abdomen:  Bowel sounds positive; abdomen soft and non-tender without masses, organomegaly, or hernias noted. No hepatosplenomegaly. Msk:  joint tenderness right knee. Pulses:  pulses normal in all 4 extremities Extremities:  No clubbing or cyanosis. Neurologic:  Alert and oriented x 3. Psych:  Normal affect.   Impression & Recommendations:  Problem # 1:  CAD, NATIVE VESSEL (ICD-414.01) She appears stable at  this time. No symptoms to suggest progression of CAD.  She will have follow-up stress test vs stress echo in one year unless she becomes symptomatic.  She is to continue current medications as prescribed. We have provided refills of her cardiac medications. Her updated medication list for this problem includes:    Nitrostat 0.4 Mg Subl (Nitroglycerin) ..... Use as  directed for chest pain up to 3 doses every 24minutes,if no relief proceed to ed    Carvedilol 25 Mg Tabs (Carvedilol) .Marland Kitchen... Take 1 tab two times a day    Aspirin 325 Mg Tabs (Aspirin) .Marland Kitchen... Take 1 tab daily  Problem # 2:  TOBACCO ABUSE (ICD-305.1) Counseling completed. She is "cutting down" to 5 cigarttes a day.  Problem # 3:  HYPERTENSION (ICD-401.9) Moderately controlled in patient with DM and CAD. Should be lower. Will continue to adjust medications should she remain elevated. Her updated medication list for this problem includes:    Furosemide 20 Mg Tabs (Furosemide) .Marland Kitchen... Take 1 tab daily    Carvedilol 25 Mg Tabs (Carvedilol) .Marland Kitchen... Take 1 tab two times a day    Aspirin 325 Mg Tabs (Aspirin) .Marland Kitchen... Take 1 tab daily  Patient Instructions: 1)  Your physician recommends that you schedule a follow-up appointment in: 1 year 2)  Your physician recommends that you continue on your current medications as directed. Please refer to the Current Medication list given to you today. Prescriptions: CARVEDILOL 25 MG TABS (CARVEDILOL) take 1 tab two times a day  #60 x 11   Entered by:   Larita Fife Via LPN   Authorized by:   Joni Reining, NP   Signed by:   Larita Fife Via LPN on 16/03/9603   Method used:   Electronically to        [facility]  [address] (retail)       [address]       [place], [place]  [address]       Ph: [phone]       Fax: [phone]   RxID:   [id number] NITROSTAT 0.4 MG SUBL (NITROGLYCERIN) use as directed for chest pain up to 3 doses every 57minutes,if no relief proceed to ED  #25 x 3   Entered by:   Larita Fife Via LPN   Authorized by:   Joni Reining, NP   Signed by:   Larita Fife Via LPN on 03/27/2535   Method used:   Electronically to        [facility]  [address] (retail)       [address]       [place], [place]  [address]       Ph: [phone]       Fax: [phone]   RxID:   [id number] RANITIDINE HCL 300 MG CAPS (RANITIDINE HCL) take  1 tab daily  #30 x 11   Entered by:   Larita Fife Via LPN   Authorized by:   Joni Reining, NP   Signed by:   Larita Fife Via LPN on 32/35/5732   Method used:   Electronically to        [facility]  [address] (retail)       [address]       [place], [place]  [address]       Ph: [phone]       Fax: [phone]  RxID:   [id number] FUROSEMIDE 20 MG TABS (FUROSEMIDE) take 1 tab daily  #30 x 11   Entered by:   Larita Fife Via LPN   Authorized by:   Joni Reining, NP   Signed by:   Larita Fife Via LPN on 44/31/5400   Method used:   Electronically to        [facility]  [address] (retail)       [address]       [place], [place]  [address]       Ph: [phone]       Fax: [phone]   RxID:   [id number]

## 2010-08-15 ENCOUNTER — Encounter: Payer: Self-pay | Admitting: Family Medicine

## 2010-08-15 DIAGNOSIS — E1149 Type 2 diabetes mellitus with other diabetic neurological complication: Secondary | ICD-10-CM | POA: Insufficient documentation

## 2010-09-01 ENCOUNTER — Encounter: Payer: Self-pay | Admitting: Family Medicine

## 2010-10-13 NOTE — Discharge Summary (Signed)
[name], STANISLAWSKI               ACCOUNT NO.:  [id number]   MEDICAL RECORD NO.:  [id number]          PATIENT TYPE:  INP   LOCATION:  2021                         FACILITY:  [facility]   PHYSICIAN:  Jesse Sans. Wall, MD, FACCDATE OF BIRTH:  Jun 21, 1960   DATE OF ADMISSION:  09/12/2007  DATE OF DISCHARGE:  09/25/2007                               DISCHARGE SUMMARY   ADDENDUM  Because of her left ventricular dysfunction, [name] was started on a  beta-blocker.  Initially, the plan was to start her also on an ACE  inhibitor or an ARB.  However, because of the need for a diuretic and  the beta-blocker, her blood pressure was felt too low to add an ACE  inhibitor, at this time.  She is to be followed closely in our office  and an ACE inhibitor or an ARB can be added as an outpatient.      Theodore Demark, PA-C      Jesse Sans. Daleen Squibb, MD, [facility]  Electronically Signed    RB/MEDQ  D:  09/25/2007  T:  09/25/2007  Job:  [id number]

## 2010-10-13 NOTE — Assessment & Plan Note (Signed)
[facility]                       [facility] OFFICE NOTE   NAME:[name], [name]                      MRN:          [id number]  DATE:06/26/2008                            DOB:          [birth year]    [name] comes in today for followup.  She last saw Dr. Eden Emms on Oct 04, 2007.   PROBLEM LIST:  1. Coronary artery disease.  She had a nonstemming in April 2009.      Cardiac cath at that time showed nonobstructive disease in LAD,      circumflex, and a totally occluded right coronary artery with      collaterals.  She had EF of 20-25%.  2. Tobacco use.  Unfortunately, she continues to smoke about a pack a      day.  3. Obesity.  4. Hyperlipidemia.  Her cholesterol in October 2009, showed a total of      243, triglycerides of 319.  She says that she is taking the      medication on a regular basis.  5. Chronic systolic congestive heart failure.  6. Hypertension.  7. Non-insulin-dependent diabetes.   She denies orthopnea, PND, or peripheral edema.  Her shortness of breath  is stable with exertion.  She has had no angina.   MEDICATIONS:  1. Carvedilol 6.25 b.i.d.  2. Glipizide 5 mg p.o. b.i.d  3. Potassium 10 mEq a daily.  4. Levothyroxine 75 mcg a day.  5. Ranitidine 300 mg a day.  6. Furosemide 20 mg per day.  7. Pravastatin 40 mg a day.  8. She carries sublingual nitroglycerin.  9. She has stopped taking her aspirin because she is taking ibuprofen      p.r.n.   PHYSICAL EXAMINATION:  VITAL SIGNS:  Her blood pressure is 120/80, pulse  66 and regular.  Weight is 243.  Respirations 18.  GENERAL:  She is in no acute distress.  SKIN:  Warm and dry.  HEENT:  Unremarkable.  NECK:  Supple.  No obvious JVD.  Carotid upstrokes were equal bilateral  without bruits.  No thyromegaly.  LUNGS:  Clear to auscultation and percussion.  HEART:  Poorly appreciated PMI.  Normal S1 and S2.  No gallop.  ABDOMEN:  Obese with good bowel sounds.   Organomegaly cannot be  assessed.  EXTREMITIES:  No edema.  Pulses were present.  NEUROLOGIC:  Intact.  SKIN:  Unremarkable.   ASSESSMENT AND PLAN:  I had a long talk with [name] today.  I have  strongly recommended that she stop smoking.  I have asked her to also  make sure she is taking her medications and checking her blood sugar on  a regular basis.  I am not sure she is taking her statin based on her  lipids, so I have no baseline lipids off of statin.   We will obtain a 2-D echocardiogram to assess left ventricular function.  If ejection fraction is greater than or equal to 35%, we will not  consider her for defibrillator.  I will plan on seeing her back in 6  months.  Thomas C. Daleen Squibb, MD, [facility]  Electronically Signed    TCW/MedQ  DD: 06/26/2008  DT: 06/27/2008  Job #: [id number]

## 2010-10-13 NOTE — Assessment & Plan Note (Signed)
[facility]                       [facility] OFFICE NOTE   NAME:[name] [name]                      MRN:          [id number]  DATE:09/19/2008                            DOB:          12-24-1960    [name] [name] comes in today for followup.   Her EF is now 55% up from 20%, 25%, and 35% in the past.  This was based  on echocardiogram obtained after her last visit in January.   She is having no angina or ischemic symptoms.  She is taking all  medications, though she cannot afford a lot of things.   Her meds are unchanged since her last visit.  Please refer to the  maintenance medications.  She is carrying sublingual nitroglycerin.   She continues to smoke about a pack a day.   PHYSICAL EXAMINATION:  VITAL SIGNS:  Her blood pressure today is 160/90,  her pulse is 66 and regular, her weight is 250 up 7.  HEENT:  Unchanged.  NECK:  Supple.  Carotid upstrokes are equal bilaterally without bruits.  No JVD.  Thyroid is not enlarged.  Trachea is midline.  LUNGS:  Clear to auscultation and percussion.  HEART:  A poorly appreciated PMI.  Normal S1 and S2.  No murmur, rub, or  gallop.  ABDOMEN:  Soft.  Good bowel sounds.  No midline bruit.  No hepatomegaly.  EXTREMITIES:  No cyanosis, clubbing, or edema.  Pulses were brisk.  NEURO:  Intact.  SKIN:  Unremarkable.   ASSESSMENT AND PLAN:  [name] [name] is stable from my standpoint.  I have  counseled her again about not smoking.  In addition, her blood pressure  is a little bit worrisome with her history of LV dysfunction, though was  improved on last echo, I have added lisinopril 20 mg per day.  She will  return in 2 weeks for a BMET.   Otherwise, I will see her back in 6 months.     Thomas C. Daleen Squibb, MD, [facility]  Electronically Signed    TCW/MedQ  DD: 09/19/2008  DT: 09/20/2008  Job #: [id number]

## 2010-10-13 NOTE — Consult Note (Signed)
[name], FREDRICKSEN               ACCOUNT NO.:  [id number]   MEDICAL RECORD NO.:  [id number]           PATIENT TYPE:   LOCATION:                                 FACILITY:   PHYSICIAN:  Veverly Fells. Excell Seltzer, MD  DATE OF BIRTH:  10/26/1960   DATE OF CONSULTATION:  DATE OF DISCHARGE:                                 CONSULTATION   CHIEF COMPLAINT:  Pseudoaneurysm right femoral artery, post cardiac  cath.   HISTORY OF PRESENT ILLNESS:  This 50 year old female patient with  history of hypertension, hypothyroidism, and migraine headaches was  admitted for dyspnea and underwent cardiac catheterization on September 20, 2007.  She was found to have significant coronary artery disease with a  decreased ejection fraction.  No interventions were performed.  She  developed a tender mass in the right inguinal region over the next 24  hours and ultrasounds on the September 21, 2007 and September 22, 2007 both  revealed a small pseudoaneurysm, which was partially thrombosed.  It was  felt that injecting thrombin into this would not be appropriate because  of the width of the neck (0.6 cm) when evaluated by Dr. Tonny Bollman.  The patient complains of some mild discomfort in this area particularly  with palpation.  No other symptoms.   PAST MEDICAL HISTORY:  1. Migraine headaches.  2. Hypothyroidism.  3. Hypertension.  4. Coronary artery disease.  5. Recently diagnosed non-insulin-dependent diabetes mellitus.   ALLERGIES:  None known.   MEDICATIONS:  1. Catapres-TTS-3.  2. Risperdal.  3. Question of hydrochlorothiazide.   SOCIAL HISTORY:  The patient has smoked a pack of cigarettes per day for  30 plus years.  She does not use alcohol.   FAMILY HISTORY:  Strongly positive for coronary artery disease in her  mother, father, and brother.  Also a stroke in a family member noted.   PHYSICAL EXAMINATION:  VITAL SIGNS:  Blood pressure is 114/66, heart  rate 72, and respirations 18.  GENERAL:  She is an  obese Caucasian female in no apparent distress.  Alert and oriented x3.  NECK:  Supple, 3+ carotid pulses palpable.  No bruits are audible.  No  palpable adenopathy in the neck.  NEUROLOGIC:  Normal.  EXTREMITIES:  Upper extremity pulses are 3+ bilaterally.  She has 3+  femoral, popliteal, and dorsalis pedis pulses bilaterally.  SKIN:  No skin rashes are noted.  CHEST:  Clear to auscultation.  CARDIOVASCULAR:  Regular rhythm with no murmurs.  ABDOMEN:  Obese.  No palpable masses.  Right inguinal area has a tender  small pulsatile mass measuring approximately 2 x 2 cm at the inguinal  crease with some mild ecchymosis.  This is mildly tender to palpation  and mildly pulsatile.   HOSPITAL TESTS:  Ultrasound study done in the vascular lab on the September 21, 2007 and September 22, 2007, both of which revealed a partially  thrombosed pseudoaneurysm, right common femoral artery.   IMPRESSION:  Partially thrombosed pseudoaneurysm, right common femoral  artery.   PLAN:  I agree with your plan to follow this and  hopefully we will  thrombose without requiring surgical intervention.  I would recommend  repeating ultrasound study on Monday the September 25, 2007 and follow up  with you.      Quita Skye Hart [place], M.D.  Electronically Signed      Veverly Fells. Excell Seltzer, MD  Electronically Signed    JDL/MEDQ  D:  09/22/2007  T:  09/23/2007  Job:  [id number]

## 2010-10-13 NOTE — H&P (Signed)
[name], [name]               ACCOUNT NO.:  [id number]   MEDICAL RECORD NO.:  [id number]          PATIENT TYPE:  INP   LOCATION:  2905                         FACILITY:  [facility]   PHYSICIAN:  Unice Cobble, MD     DATE OF BIRTH:  09/03/1960   DATE OF ADMISSION:  09/12/2007  DATE OF DISCHARGE:                              HISTORY & PHYSICAL   CHIEF COMPLAINT:  Dyspnea.   HISTORY OF PRESENT ILLNESS:  This is a 50 year old white female with a  history of hypertension, hypothyroidism and migraines and one episode of  psychosis in the past who presented with dyspnea.  According to the  family, the patient has been having shortness of breath for  approximately 2 weeks and had some sort of viral illness 2 weeks prior  to that.  Her dyspnea has gotten to the point that on exertion of coming  up the driveway, she is completely wiped out.  She arrived in the [facility] emergency department short of breath with an O2 sat of 80% on 100%  FIO2.  She was placed on BiPAP and was unable tolerate this.  Her O2  sats and ABG were sent from a pH of 7.35-7.14 with an increase of CO2  from 41-75.  Her O2 sat remained in the high 50s throughout this time.  She was intubated semi-urgently and transferred upon the ED physician's  request.   PAST MEDICAL HISTORY:  1. Migraines.  2. Hypothyroidism.  3. Hypertension.  4. History of psychosis associated with hypothyroidism.  5. History of mood disorder.   ALLERGIES:  NO KNOWN DRUG ALLERGIES.   MEDICATIONS:  1. Catapres TTS 3  2. Risperdal.  3. Question hydrochlorothiazide.   SOCIAL HISTORY:  Unable to be obtained secondary to intubation and  sedation.  However, in the record, it states that she is a heavy smoker.   FAMILY HISTORY:  Unable to obtain secondary to sedation.   REVIEW OF SYSTEMS:  Unable to obtain secondary to sedation.   PHYSICAL EXAMINATION:  VITAL SIGNS:  Temperature is 98.5 with a pulse of  63, respiratory rate is 24, blood  pressure is 101/46, O2 sats are 91% on  PRBC with a PEEP of 4, tidal volume is 650 with a rate of 12 and a peak  of 41.  GENERAL:  She is sedated and intubated.  This is a morbidly obese  female.  HEENT:  Shows PERRLA, MMM.  NECK:  Supple without lymphadenopathy, thyromegaly or bruits.  She does  have jugular venous distention.  HEART:  Regular rate and rhythm with a normal S1-S2.  Murmurs, gallops  and rubs are absent.  LUNGS:  Showed decreased breath sounds throughout with crackles  independently.  No rashes or lesions.  ABDOMEN:  Soft with normal bowel sounds.  No rebound or guarding.  Negative splenomegaly.  EXTREMITIES:  Show no cyanosis, clubbing or edema.  NEUROLOGICAL:  She is alert and oriented times zero.   RADIOLOGY:  A transcript of the read of the outside hospital chest x-  rays says acute pulmonary edema with marked cardiomegaly.  ECG:  ECG shows a rate of 63 in normal sinus rhythm.  She has a QRS of  154 with a left bundle branch block pattern.  She also has left  ventricular hypertrophy with repolarization abnormality.   Labs:  She has an elevated white count of 22.2 with a hemoglobin of  12.5.  Creatinine is 0.9.  Her blood glucose is 241.   ASSESSMENT/PLAN:  This is a 49 year old white female with acute  pulmonary edema, cardiomegaly, hypertensive urgency and an abnormal ECG.  With her enlarged heart and acute course, this could be consistent with  fulminant myocarditis causing CHF and resultant pulmonary edema.  Her  increased white blood cell count may be due to this or possibly  concurrent pneumonia.  I cannot rule out ischemic disease at this time  or simple rebound hypertension after stopping Catapres on an already  poor heart.  For now, I will stabilize her with a nitroglycerin drip and  diuresis.  When she is HCG negative, a low-dose ACE inhibitor and beta  blocker will be started.  She will need repeat chest x-ray.  D-dimer and  echocardiogram will need  to be checked.  TSH will need to be checked.  I  will also rule out toxic abnormalities with a UDS, Tylenol, and aspirin  level based on her history of psychosis.  Pulmonary consult will need to  be obtained for ventilator management, and this has been done.  In  general, this is a very sick woman, and I have explained to the family  the nature of her illness and the possible poor prognosis.      Unice Cobble, MD  Electronically Signed     ACJ/MEDQ  D:  09/12/2007  T:  09/12/2007  Job:  [id number]

## 2010-10-13 NOTE — Cardiovascular Report (Signed)
[name], [name]               ACCOUNT NO.:  [id number]   MEDICAL RECORD NO.:  [id number]          PATIENT TYPE:  INP   LOCATION:  2021                         FACILITY:  [facility]   PHYSICIAN:  Arturo Morton. Riley Kill, MD, FACCDATE OF BIRTH:  [birth year]   DATE OF PROCEDURE:  09/20/2007  DATE OF DISCHARGE:                            CARDIAC CATHETERIZATION   INDICATIONS:  [name] is a 50 year old who presented with acute  pulmonary edema, intubated and not doing well.  She has stopped all of  her medications.  She was hypothyroid, she was intubated.  She gradually  improved.  Echocardiogram suggest significant left ventricular  dysfunction.  The current study was done to assess coronary anatomy and  right left heart function.   PROCEDURE:  1. Right and left heart catheterization.  2. Selective coronary arteriography.  3. Selective left ventriculography.   DESCRIPTION OF PROCEDURE:  The patient was brought to the cath lab and  prepped and draped in the usual fashion.  Through an anterior puncture,  the femoral vein was entered.  A 7-French sheath was placed.  Saturations were obtained from the superior vena cava and the pulmonary  artery with sequential pressures measured at the same time.  Thermodilution cardiac outputs were performed.  Following this, the  right femoral artery was easily entered.  Using an anterior puncture,  the 5-French sheath was placed.  Central aortic and left ventricular  pressures were measured with pigtail.  Simultaneous wedge LV were  obtained.  The right heart catheter was then pulled back in the right  ventricle and right atrium to exclude equilibration.  The Swan-Ganz  catheter was then removed.  Ventriculography was then done in the RAO  projection.  Following this, coronary arteriography was performed.  We  used a 35 Judkins left to access the left coronary artery and a Noto to  access the right coronary artery.  She tolerated the procedure without  complication and was taken to the holding area in satisfactory clinical  condition.  At the patient's request, I then spoke with the patient's  half-sister.   HEMODYNAMIC DATA:  1. Right atrial pressure 11.  2. RV 37/13.  3. Pulmonary artery 36/20, mean 28.  4. Pulmonary capillary wedge 19.  5. LV 112/80.  6. Aortic 102/62, mean 78.  7. Superior vena cava saturation 61%.  8. Pulmonary artery saturation 64%.  9. Aortic saturation 96%.  10.Fick cardiac output 5.8 L/min.  11.Fick cardiac index 2.7 L/min/m2.  12.Thermodilution cardiac output through 7.3 L/min.  13.Thermodilution cardiac index 3.45 L/min/m2.   ANGIOGRAPHIC DATA:  1. Ventriculography in the RAO projection reveals a dilated left      ventricle which was poorly contractile.  Ejection fraction to be      estimated at 25%-30%.  In particular, the inferior wall appears to      be akinetic while everything else was hypokinetic.  2. The left main is free of critical disease.  3. The LAD courses to the apex.  There is diffuse calcified disease in      the proximal midportion of the LAD with about  50%-60% narrowing.      There is a segmental smooth plaque in the mid distal LAD of 50%-60%      as well.  There is diffuse luminal irregularity throughout the      diagonal and distal LAD system, but not critical stenosis.  4. The circumflex is a very large vessel.  There are 2 tiny marginal      branches that are bit bend.  In the bend, there is 50%-60%      narrowing.  This was followed by a branch and another branch.  A      sub-branch has 50% narrowing and then a distal sub-branch has about      90% narrowing very peripherally.  5. The right coronary artery is subtotally occluded proximally and      then subtotally occluded again.  There is competitive filling of      the mid vessel.  There is evidence of collaterals from left to      right.   CONCLUSION:  1. Moderately severe to severe reduction in overall left ventricular       function, probably due to discontinuation of antihypertensive      medications and coronary artery disease.  2. Moderately severe scattered disease of the LAD and circumflex that      does not appear to be critical.  3. Collateralized right coronary artery that supplies what appears to      be probably nearly akinetic segment in the inferior wall.   DISPOSITION:  The patient's compliance has been poor.  She has stopped  medications on multiple occasions.  She is currently hypothyroid, and as  a result we have restarted her Synthroid.  Optimally, we will get her  back to euthyroid status at the present time, get her back on her  medications, and eventually follow her up.  I think she can be treated  medically for now.  At the present time, I am not sure that much would  be gained by percutaneous intervention of the right coronary artery but  I will review this with my colleagues.  Most importantly, she will need  to discontinue smoking and take better care of herself, and then this  may be a significant challenge.      Arturo Morton. Riley Kill, MD, [facility]  Electronically Signed     TDS/MEDQ  D:  09/20/2007  T:  09/21/2007  Job:  [id number]   cc:   Patrica Duel, M.D.  Gerrit Friends. Dietrich Pates, MD, [facility]

## 2010-10-13 NOTE — Assessment & Plan Note (Signed)
[facility]                       [place] [facility] OFFICE NOTE   NAME:[name], EXIE CHRISMER                      MRN:          [id number]  DATE:10/04/2007                            DOB:          Dec 28, 1960    HISTORY OF PRESENT ILLNESS:  Ms. Kniskern is a new patient to me.  She was  hospitalized at [facility] from April 14-April 27 and taken care of by  Dr. Riley Kill.   Unfortunately, I did not participate in her care.  She is certainly  complicated.  She was noncompliant with her medications for about a  month prior to being seen at the [facility] ER for hypertensive crisis  and pulmonary edema.  She was intubated.  She subsequently had an echo  which suggested decreased LV function after she was put on medication  for her blood pressure and heart failure.  She subsequently had a heart  cath 8 days later by Dr. Riley Kill.  At that time, she had a totally  occluded right coronary artery with collaterals and only moderate  disease in the LAD and circ.  EF was estimated to be 25-30% with an  inferior wall motion abnormality.  Right heart catheterization showed a  mean right atrial pressure of 11.  PA pressure 36/20 mean wedge pressure  was 19.   It took me quite awhile to review all of the patient's hospital records  including her angiogram.   These were reviewed on [employer].   The patient subsequently had her medications titrated before hospital  discharge.  There was a question of starting her on an ACE inhibitor,  ARB, but her blood pressure was somewhat low, and she was thought to  need diuretics and a beta-blocker is more important   The patient also had a significant pseudoaneurysm after her heart cath.  She had three ultrasounds, and on the last ultrasound prior to  discharge, the neck of the pseudoaneurysm appeared to be thrombosed.  She was not thought to be a candidate for thrombin injection and Dr.  Hart Rochester was consulted and did not want  to operate.   PAST MEDICAL HISTORY:  1. History of migraines.  2. Hypothyroidism.  3. Hypertension.  4. Non-insulin-dependent diabetes.   ALLERGIES:  She has no known allergies.   CURRENT MEDICATIONS:  1. Carvedilol 6.25 mg a day.  2. Glipizide 5 b.i.d.  3. Klor-Con 10 a day.  4. Thyroxine 75 mcg a day.  5. Ranitidine 300 a day.  6 . Aspirin a day.  1. Lasix 20 a day.  2. Pravastatin 40 a day.   SOCIAL HISTORY:  She had previously been working at [employer].   She does have nitroglycerin.  She brought the [facility] [facility]  Department of Social Service form with her today.  This also took time  to fill out   REVIEW OF SYSTEMS:  In talking to the patient, she is not having chest  pain.  She is sedentary.  She does not seem very motivated.  She says  she has been compliant with her medications.  Her blood pressure seems  to been under better control.  There is no PND, orthopnea.  No lower  extremity edema.   Her leg also seems to have healed up well with minimal pain.   PHYSICAL EXAMINATION:  VITAL SIGNS:  Current exam is remarkable for  weight of 226, blood pressure 110/80, pulse 60 and regular, afebrile.  HEENT:  Unremarkable.  Carotids normal without bruit.  No  lymphadenopathy, thyromegaly, JVP elevation.  LUNGS:  Clear with good diaphragmatic motion.  No wheezing.  HEART:  S1-S2.  Normal heart sounds.  PMI normal.  ABDOMEN:  Protuberant.  Bowel sounds positive.  No AAA.  No tenderness.  No hepatosplenomegaly.  No hepatojugular reflux .  EXTREMITIES:  Right femoral artery site now seems well healed.  There is  no ecchymosis.  No residual bruit.  Distal pulses are intact.  No edema.  NEUROLOGICAL:  Nonfocal.  SKIN:  Warm and dry.  No muscle weakness.   IMPRESSION:  1. Coronary artery disease, total right which is collateralized.  No      critical left-sided disease, probably follow-up Myoview in a year.      Continue aspirin and beta-blocker.  2.  Decreased LV function in the setting of remodeling and probable      ischemic cardiomyopathy.  Probable follow-up echo or MRI in 6      months.  The patient may need further risk stratification for an      AICD.  For the time being, would continue aspirin and beta-blocker.      Will hold off an addition of ACE inhibitor until we see where her      blood pressure lies.  When we see her in 6 months, she will have a      B-met and BNP.  3. Diabetes.  Follow-up with Health Department.  Continue glipizide,      hemoglobin A1c quarterly.  4. History of hypothyroidism.  TSH apparently was elevated in the      hospital.  Current dose of Synthroid 75 mcg, TSH and T4 in 3      months.  5. History of reflux.  Continue ranitidine.  6. Hypercholesteremia in the setting coronary disease.  Continue      pravastatin 40 a day, lipid and liver profile in 3 months.   Despite the patient being hospitalized, I think the main issue was her  noncompliance with medications.  She clearly has some health problems.  However, coronary disease would appear to be stable.  She is not  actively in heart failure, and I think the patient can go back to work.   I will see her back in about 6 months, at which point we will reassess  her LV function and cardiac MRI.  Assessing for viability and scar  tissue may be in order.  She may be a good candidate for the determine  trial.     Theron Arista C. Eden Emms, MD, [facility]  Electronically Signed    PCN/MedQ  DD: 10/04/2007  DT: 10/04/2007  Job #: [id number]

## 2010-10-13 NOTE — Discharge Summary (Signed)
NAMEDEMETRIA, IWAI               ACCOUNT NO.:  [id number]   MEDICAL RECORD NO.:  [id number]          PATIENT TYPE:  INP   LOCATION:  2021                         FACILITY:  [facility]   PHYSICIAN:  Jesse Sans. Wall, MD, FACCDATE OF BIRTH:  Jul 19, 1960   DATE OF ADMISSION:  09/12/2007  DATE OF DISCHARGE:  09/25/2007                               DISCHARGE SUMMARY   PROCEDURES:  1. Prolonged mechanical ventilation.  2. Cardiac catheterization.  3. Coronary arteriogram.  4. Left ventriculogram.  5. 2-D echocardiogram.  6. Right groin ultrasound.   PRIMARY FINAL DISCHARGE DIAGNOSIS:  Acute systolic congestive heart  failure.   SECONDARY DIAGNOSES:  1. Non-ST-segment elevation myocardial infarction, medical therapy      recommended.  2. Ventilator-dependent respiratory failure secondary to adult      respiratory distress syndrome and on catheterization, felt      secondary to viral illness.  3. Anemia.  4. Diabetes with a hemoglobin A1c of 6.4.  5. Hypothyroidism with a thyroid-stimulating hormone of 50.385 on      admission, Synthroid restarted.  6. Tobacco use.  7. Hypertension.  8. Hyponatremia.  9. Hypokalemia.  10.Right femoral pseudoaneurysm.   TIME OF DISCHARGE:  54 minutes.   HOSPITAL COURSE:  Ms. Huebert is a 50 year old female who presented with  dyspnea.  She had had a viral illness.  She came to the [facility]  Emergency Room with an O2 saturation of 80% on a 100% nonrebreather.  She was intubated in the emergency room and transferred to [facility].   Her chest x-ray showed pulmonary edema and congestive heart failure.  Her cardiac enzymes were elevated indicating a non-ST- segment elevation  MI.  Her echocardiogram showed left ventricular dysfunction with an EF  of 35%.  She was diuresed, and as her medical condition improved,  scheduled for cardiac catheterization.  She was extubated on September 14, 2007.   On September 20, 2007, she was stable for cardiac  catheterization.  The  cardiac catheterization showed inferior hypokinesis and akinesis with an  EF of 25-30%.  RCA had a 95% stenosis proximally and then was totaled.  She had a 50% stenosis in the LAD, circumflex, and OM.  There was a  distal vessel that was small with 90% stenosis, but Dr. Riley Kill felt  that should be managed medically.   She had a history of hypothyroidism, but that she had not been on  medication for a month because she could not afford it.  Her TSH was  significantly elevated.  Her prior levothyroxine dose had been 125 mcg  daily, but she had not been on this progression; however, the last time  she had this filled was in July 2008.  She was started initially on  levothyroxine 50 mcg daily, and this was increased to 75 mcg daily prior  to discharge.  She can have a followup TSH in 4-6 weeks with further  dosage adjustments at that time.   Ms. Loomer was anemic with the hemoglobin generally between 9 and 10.  Her MCV was stable and this was felt generally secondary  to blood draws  and procedures.  She is to follow up as an outpatient.  Also, she had  hypokalemia and hyponatremia secondary to diuresis, but this resolved.  A smoking cessation consult was called, and she was encouraged to quit.  She was seen by case management and encouraged to follow up with the  [facility] for outpatient medical care.  She will also be  followed by  [facility] in [place].   Ms. Scheiderer had tenderness and a hematoma at her cath site.  She had an  ultrasound, which showed a pseudoaneurysm.  A recheck the next day  showed the pseudoaneurysm is still present, but it was partially  thrombosed.  Dr. Excell Seltzer evaluated Ms. Binz and felt that the neck was  0.6-0.7 cm and with too large prothrombin.  The pseudoaneurysm appeared  to be less than 2.5 cm.  A [facility] consult was called.  Dr. Hart Rochester saw Ms.  Vanoverbeke and felt that surgical intervention was not required.  He  felt  that she could be followed and rechecked on Monday with reconsideration  of surgical intervention if it did not close spontaneously.   On September 25, 2007, Ms. Coburn was ambulating without chest pain or  shortness of breath.  The pseudoaneurysm appeared thrombosed.   Ms. Briski was seen by care management.  She had significant issues  affording her medications.  At discharge, everything possible was  changed to [employer] four dollar medication, which will make it easier  for her to get them.  She had been started on Lantus while in the  hospital, but this was changed to glipizide, which is four dollars at  [employer], to facilitate her taking this medication.  Dr. Daleen Squibb evaluated  Ms. Fredric Mare on September 25, 2007, and considered her stable for discharge  with close outpatient followup.   DISCHARGE INSTRUCTIONS:  Her activity level is to be increased  gradually.  She is not to do any lifting for 2 weeks.  She is to stick  to low-fat diabetic diet.  She is to call our office for problems with  the cath site.  She is to follow up with Dr. Eden Emms at the [place]  office on Oct 04, 2007 at 1:30, and she is to follow up with the  [facility] as well.   DISCHARGE MEDICATIONS:  1. Glipizide 5 mg b.i.d.  2. Pravachol 40 mg a day.  3. Ranitidine 300 mg daily.  4. Coreg 6.25 mg b.i.d.  5. Lasix 20 mg a day.  6. K-Dur 10 mEq a day.  7. Aspirin 325 mg daily.  8. Levothyroxine 75 mcg daily  9. Nitroglycerin sublingual p.r.n.      Theodore Demark, PA-C      Jesse Sans. Daleen Squibb, MD, [facility]  Electronically Signed    RB/MEDQ  D:  09/25/2007  T:  09/26/2007  Job:  [id number]   cc:   Patsy Baltimore

## 2010-10-16 NOTE — Discharge Summary (Signed)
[name], [name]               ACCOUNT NO.:  [id number]   MEDICAL RECORD NO.:  [id number]          PATIENT TYPE:  IPS   LOCATION:  0400                          FACILITY:  [facility]   PHYSICIAN:  Jasmine Pang, M.D. DATE OF BIRTH:  [birth year]   DATE OF ADMISSION:  08/24/2006  DATE OF DISCHARGE:  08/29/2006                               DISCHARGE SUMMARY   IDENTIFICATION:  A 50 year old single white female who was admitted on  an involuntary basis on August 24, 2006.   HISTORY OF PRESENT ILLNESS:  The patient presented to the ED after EMS  was called to the post office.  The patient apparently was standing  there at the post office counter for greater than 4 hours unwilling to  move.  Her family was called and arrived, but were unable to move her  also.  The EMS brought her to the ED.  The patient was nonverbal and  required a urinary cath for specimen.  There is no known psychiatric  history.  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  Diagnostic tests in  the ED were unremarkable.  As indicated above, there was no known past  psychiatric history.  No known drug or alcohol abuse.  She has a history  of migraine headaches and hypothyroidism.  She has no known drug  allergies.   PHYSICAL FINDINGS:  Complete physical exam was done in the ED.  There  were no acute medical abnormalities and the patient was in no acute  physical distress.   ADMISSION LABORATORY:  CT scan was within normal limits.  INR was 1.  CBC was within normal limits.  Urinalysis within normal limits.  Alcohol  level less than 5.  Urine drug screen negative.  Urine pregnancy test  was negative.  TSH was 53.1 (0.35-5.5.).   HOSPITAL COURSE:  Upon admission the patient was started on Risperdal M  Tab 1 mg p.o. now, Risperdal M Tab 1 mg p.o. q.h.s. and also Risperdal M  Tab 1 mg p.o. b.i.d. p.r.n. agitation or psychosis.  There was an order  to  force fluids to 800 cc per shift and record p.o. intake.  The patient  was initially placed on one-to-one observation since she was severely  thought disordered and unable to appreciate boundaries or express needs.  She was placed on a clonidine patch 0.2 mg p.o. q. week.  The patient  was also placed on Ativan 2 mg p.o. q.8h. p.r.n. anxiety/agitation and a  nicotine patch 14 mg for smoking cessation.  Ensure p.o. q.i.d. until  taking full meals.  On August 24, 2006 the patient was transferred to the  emergency room for hypertensive crisis.  She was stabilized there and  transferred back.  On August 25, 2006 she was given Ativan 1 mg p.o. now  for agitation.  On August 25, 2006 the patient was started on  hydrochlorothiazide daily, first dose today and Synthroid 50 mcg daily  for her elevated TSH.  On August 26, 2006 Risperdal tabs were increased  to 2 mg p.o. q.h.s.  Her one-to-one precautions were discontinued due to  the patient being steadier and better oriented and more aware of  boundaries.  On August 27, 2006 Celexa 20 mg p.o. daily was added.  On  August 28, 2006 Risperdal was decreased to 1.5 mg M Tabs p.o. q.h.s.  The  patient tolerated her medications well except for some sedation,  otherwise there were no side effects.   Upon first meeting the patient, she was catatonic and very psychotic.  She was trying to get out of the unit door.  She did well when Risperdal  M Tabs 1 mg was started.  She had been given the clonidine patch for the  elevated blood pressure.  She was also given Ativan 2 mg upon admission  for her agitation and inability to respond to boundaries.  On August 26, 2006 the patient had improved markedly.  She discussed her catatonia  prior to admission.  There was no previous psychiatric history.  There  was a strong family history of depression.  She discussed stressors,  numerous losses (deaths of family members.)  She works as a Lawyer in [employer].  Her sleep was poor upon  admission.  Appetite fair until her migraine  before admission.  She was somewhat hyperverbal with mildly  circumstantial thinking.  She lives by herself in [place].  Risperdal  M Tab was increased from 1 mg p.o. q.h.s. to 2 mg p.o. q.h.s.  One-to-  one precautions were discontinued at this time due to the patient's  stability.  On August 27, 2006 affect was blunted with psychomotor  slowing.  Speech minimal, but normal pace and tone.  Mood was somewhat  apathetic.  She denied any auditory or visual hallucinations.  On August 28, 2006 the patient continued to improve.  She endorsed a lot of stress  with some depressed mood related to the death of her grandmother.  Not  only was she caring for her grandmother and close to her, she was also  receiving money for caring for the grandmother.  She was able to  verbalize well and appeared much improved.  She was no longer catatonic.   On August 29, 2006 mental status had improved markedly from admission.  The patient was friendly, conversant, cooperative.  She had good eye  contact.  Speech was normal rate and flow.  Her psychomotor activity was  within normal limits.  There was no catatonia.  The mood was euthymic.  Affect wide range.  There was no suicidal or homicidal ideation.  No  thoughts of self injurious behavior.  No paranoia or delusions.  No  auditory or visual hallucinations.  Thoughts were logical and goal-  directed.  Thought content no predominant theme.  Cognitive was grossly  back to baseline.  It was felt the patient was safe to go home with her  family today.  Family had been visiting and agreed that she was ready  for discharge.   DISCHARGE DIAGNOSES:  AXIS I:  1. Psychotic disorder not otherwise specified.  2. Mood disorder not otherwise specified.  AXIS II: None.  AXIS III: Hypothyroidism, hypertension.  AXIS IV: Severe (problems with primary support group, problems related to social environment, economic problem,  burden of psychiatric illness,  medical problems.   POST HOSPITAL CARE PLANS:  The patient had no specific activity level or  dietary restrictions.   DISCHARGE MEDICATIONS:  The patient was discharged from [facility] 2  patch every  7 days - last dose 08/24/2006, levothyroxine 50 mcg daily,  hydrochlorothiazide 25 mg daily, Celexa 20 mg daily, Risperdal 1.5 mg  p.o. q.h.s.   POST HOSPITAL CARE PLANS:  The patient will be seen at the [facility].  This appointment is being arranged by our  case manager.      Jasmine Pang, M.D.  Electronically Signed     BHS/MEDQ  D:  08/29/2006  T:  08/29/2006  Job:  [id number]

## 2010-12-08 ENCOUNTER — Encounter: Payer: Self-pay | Admitting: Family Medicine

## 2011-02-23 LAB — CBC
HCT: 36.1
HCT: 26 — ABNORMAL LOW
HCT: 31.9 — ABNORMAL LOW
HCT: 32 — ABNORMAL LOW
HCT: 32.2 — ABNORMAL LOW
HCT: 33.4 — ABNORMAL LOW
Hemoglobin: 12.5
Hemoglobin: 10.7 — ABNORMAL LOW
Hemoglobin: 10.6 — ABNORMAL LOW
Hemoglobin: 11.2 — ABNORMAL LOW
Hemoglobin: 11.5 — ABNORMAL LOW
MCHC: 34.3
MCHC: 33.6
MCHC: 34
MCHC: 34.1
MCHC: 34.7
MCHC: 34.8
MCHC: 35.1
MCV: 94.8
MCV: 94.9
MCV: 95.2
MCV: 95.6
MCV: 95.6
MCV: 95.7
MCV: 95.7
MCV: 95.7
MCV: 95.7
Platelets: 355
Platelets: 246
Platelets: 251
Platelets: 262
Platelets: 263
RBC: 3.28 — ABNORMAL LOW
RBC: 2.95 — ABNORMAL LOW
RBC: 3.06 — ABNORMAL LOW
RBC: 3.27 — ABNORMAL LOW
RBC: 3.34 — ABNORMAL LOW
RBC: 3.36 — ABNORMAL LOW
RBC: 3.42 — ABNORMAL LOW
RBC: 3.49 — ABNORMAL LOW
RDW: 14.3
RDW: 14.4
RDW: 14.9
RDW: 14.9
WBC: 10.4
WBC: 12.6 — ABNORMAL HIGH
WBC: 14.2 — ABNORMAL HIGH
WBC: 16.8 — ABNORMAL HIGH
WBC: 18 — ABNORMAL HIGH
WBC: 9.8

## 2011-02-23 LAB — RAPID URINE DRUG SCREEN, HOSP PERFORMED
Benzodiazepines: POSITIVE — AB
Cocaine: NOT DETECTED
Tetrahydrocannabinol: NOT DETECTED

## 2011-02-23 LAB — BLOOD GAS, ARTERIAL
Acid-Base Excess: 2.9 — ABNORMAL HIGH
Acid-Base Excess: 3.4 — ABNORMAL HIGH
Acid-base deficit: 2.7 — ABNORMAL HIGH
Acid-base deficit: 0.7
Bicarbonate: 22.2
Bicarbonate: 22.4
Bicarbonate: 23.6
Bicarbonate: 24.7 — ABNORMAL HIGH
Bicarbonate: 26.8 — ABNORMAL HIGH
FIO2: 100
FIO2: 100
FIO2: 1
FIO2: 0.5
FIO2: 0.5
MECHVT: 500
MECHVT: 0.36
MECHVT: 360
O2 Content: 15
O2 Saturation: 83.8
O2 Saturation: 93.4
O2 Saturation: 93.6
O2 Saturation: 94.7
O2 Saturation: 93.9
O2 Saturation: 94.3
PEEP: 5
PEEP: 10
PEEP: 10
PEEP: 12
PEEP: 10
Patient temperature: 37
Patient temperature: 98.6
Patient temperature: 98.6
Patient temperature: 98.6
Patient temperature: 98.4
Patient temperature: 98.6
Pressure support: 18
RATE: 12
RATE: 20
RATE: 22
RATE: 20
TCO2: 20.4
TCO2: 20.7
TCO2: 26.1
TCO2: 26.5
TCO2: 28
pCO2 arterial: 44.6
pCO2 arterial: 75.4
pCO2 arterial: 37.2
pH, Arterial: 7.144 — CL
pH, Arterial: 7.351
pH, Arterial: 7.326 — ABNORMAL LOW
pH, Arterial: 7.416 — ABNORMAL HIGH
pH, Arterial: 7.429 — ABNORMAL HIGH
pO2, Arterial: 54.6 — ABNORMAL LOW
pO2, Arterial: 56 — ABNORMAL LOW
pO2, Arterial: 58.4 — ABNORMAL LOW
pO2, Arterial: 65.5 — ABNORMAL LOW
pO2, Arterial: 73.6 — ABNORMAL LOW
pO2, Arterial: 75.1 — ABNORMAL LOW

## 2011-02-23 LAB — CARDIAC PANEL(CRET KIN+CKTOT+MB+TROPI)
CK, MB: 10.4 — ABNORMAL HIGH
Relative Index: 1.6
Relative Index: 2.3
Relative Index: 2.6 — ABNORMAL HIGH
Total CK: 396 — ABNORMAL HIGH
Troponin I: 0.1 — ABNORMAL HIGH

## 2011-02-23 LAB — EPSTEIN-BARR VIRUS VCA ANTIBODY PANEL
EBV EA IgG: 0.3
EBV VCA IgG: 2.39 — ABNORMAL HIGH
EBV VCA IgM: 0.22

## 2011-02-23 LAB — BASIC METABOLIC PANEL
BUN: 17
BUN: 16
BUN: 17
BUN: 28 — ABNORMAL HIGH
BUN: 30 — ABNORMAL HIGH
CO2: 27
CO2: 28
CO2: 29
CO2: 30
Calcium: 8.2 — ABNORMAL LOW
Calcium: 8.6
Calcium: 9.3
Chloride: 102
Chloride: 105
Chloride: 101
Chloride: 102
Chloride: 104
Chloride: 105
Chloride: 96
Creatinine, Ser: 0.89
Creatinine, Ser: 0.97
GFR calc Af Amer: >60
GFR calc Af Amer: >60
GFR calc Af Amer: >60
GFR calc Af Amer: >60
GFR calc Af Amer: >60
GFR calc Af Amer: >60
GFR calc non Af Amer: >60
GFR calc non Af Amer: 58 — ABNORMAL LOW
GFR calc non Af Amer: >60
GFR calc non Af Amer: >60
Glucose, Bld: 241 — ABNORMAL HIGH
Glucose, Bld: 166 — ABNORMAL HIGH
Glucose, Bld: 167 — ABNORMAL HIGH
Glucose, Bld: 98
Potassium: 3.5
Potassium: 3.6
Potassium: 3.7
Potassium: 3.7
Potassium: 3.8
Potassium: 3.9
Potassium: 3.9
Potassium: 4
Sodium: 134 — ABNORMAL LOW
Sodium: 139
Sodium: 134 — ABNORMAL LOW
Sodium: 137
Sodium: 139
Sodium: 141

## 2011-02-23 LAB — CULTURE, BLOOD (ROUTINE X 2): Culture: NO GROWTH

## 2011-02-23 LAB — BASIC METABOLIC PANEL WITH GFR
BUN: 14
BUN: 18
CO2: 26
CO2: 27
Calcium: 8.8
Calcium: 9
Chloride: 104
Chloride: 99
Creatinine, Ser: 0.95
Creatinine, Ser: 0.97
GFR calc non Af Amer: >60
GFR calc non Af Amer: >60
Glucose, Bld: 144 — ABNORMAL HIGH
Glucose, Bld: 184 — ABNORMAL HIGH
Potassium: 3.8
Potassium: 3.8
Sodium: 134 — ABNORMAL LOW
Sodium: 138

## 2011-02-23 LAB — POCT I-STAT 3, VENOUS BLOOD GAS (G3P V)
Acid-base deficit: 2
Bicarbonate: 23.9
O2 Saturation: 61
Operator id: 236231
TCO2: 25
TCO2: 28
pCO2, Ven: 41.4 — ABNORMAL LOW
pCO2, Ven: 42.3 — ABNORMAL LOW
pH, Ven: 7.36 — ABNORMAL HIGH
pH, Ven: 7.414 — ABNORMAL HIGH
pO2, Ven: 33

## 2011-02-23 LAB — URINE DRUGS OF ABUSE SCREEN W ALC, ROUTINE (REF LAB)
Barbiturate Quant, Ur: NEGATIVE
Cocaine Metabolites: NEGATIVE
Marijuana Metabolite: NEGATIVE
Methadone: NEGATIVE
Phencyclidine (PCP): NEGATIVE
Propoxyphene: NEGATIVE

## 2011-02-23 LAB — POCT I-STAT 3, ART BLOOD GAS (G3+)
Acid-Base Excess: 3 — ABNORMAL HIGH
Bicarbonate: 26.1 — ABNORMAL HIGH
Operator id: 236231
TCO2: 27
pH, Arterial: 7.486 — ABNORMAL HIGH

## 2011-02-23 LAB — DIFFERENTIAL
Basophils Absolute: 0.1
Eosinophils Absolute: 0.2
Eosinophils Relative: 1
Lymphs Abs: 5.9 — ABNORMAL HIGH
Monocytes Absolute: 1

## 2011-02-23 LAB — MAGNESIUM
Magnesium: 1.9
Magnesium: 2.1

## 2011-02-23 LAB — LIPID PANEL
Cholesterol: 215 — ABNORMAL HIGH
HDL: 24 — ABNORMAL LOW
Total CHOL/HDL Ratio: 9
VLDL: 46 — ABNORMAL HIGH

## 2011-02-23 LAB — APTT
aPTT: 28
aPTT: 32
aPTT: 35
aPTT: 35
aPTT: 40 — ABNORMAL HIGH
aPTT: 96 — ABNORMAL HIGH

## 2011-02-23 LAB — PROTIME-INR
INR: 1
INR: 1
INR: 1.1
INR: 1.1
INR: 1.1
INR: 1.1
INR: 1.1
Prothrombin Time: 13.4
Prothrombin Time: 13.8
Prothrombin Time: 13.8
Prothrombin Time: 14.2
Prothrombin Time: 14.5
Prothrombin Time: 14.6
Prothrombin Time: 14.7
Prothrombin Time: 14.8

## 2011-02-23 LAB — HEPARIN LEVEL (UNFRACTIONATED)
Heparin Unfractionated: <0.1 — ABNORMAL LOW
Heparin Unfractionated: <0.1 — ABNORMAL LOW
Heparin Unfractionated: 0.22 — ABNORMAL LOW
Heparin Unfractionated: 0.62

## 2011-02-23 LAB — URINALYSIS, ROUTINE W REFLEX MICROSCOPIC
Bilirubin Urine: NEGATIVE
Glucose, UA: NEGATIVE
Ketones, ur: NEGATIVE
Leukocytes, UA: NEGATIVE
pH: 5.5

## 2011-02-23 LAB — COMPREHENSIVE METABOLIC PANEL
AST: 16
Alkaline Phosphatase: 68
BUN: 20
CO2: 27
Chloride: 102
Creatinine, Ser: 0.93
GFR calc non Af Amer: >60
Potassium: 3.1 — ABNORMAL LOW
Total Bilirubin: 0.8

## 2011-02-23 LAB — URINALYSIS, DIPSTICK ONLY
Bilirubin Urine: NEGATIVE
Nitrite: NEGATIVE
Specific Gravity, Urine: 1.023
pH: 5.5

## 2011-02-23 LAB — POCT CARDIAC MARKERS
CKMB, poc: 4.8
Myoglobin, poc: 66.7
Troponin i, poc: <0.05

## 2011-02-23 LAB — CROSSMATCH

## 2011-02-23 LAB — ETHANOL: Alcohol, Ethyl (B): <5

## 2011-02-23 LAB — CULTURE, BAL-QUANTITATIVE W GRAM STAIN: Colony Count: 80000

## 2011-02-23 LAB — HEMOGLOBIN A1C: Hgb A1c MFr Bld: 6.4 — ABNORMAL HIGH

## 2011-02-23 LAB — URINE MICROSCOPIC-ADD ON

## 2011-02-23 LAB — LEGIONELLA ANTIGEN, URINE: Legionella Antigen, Urine: NEGATIVE

## 2011-02-23 LAB — B-NATRIURETIC PEPTIDE (CONVERTED LAB): Pro B Natriuretic peptide (BNP): 599 — ABNORMAL HIGH

## 2011-02-23 LAB — ABO/RH: ABO/RH(D): O NEG

## 2011-02-23 LAB — URINE CULTURE

## 2011-02-23 LAB — COXSACKIE B VIRUS ANTIBODIES
Coxsackie B4 Ab: <1:8 {titer}
Coxsackie B6 Ab: <1:8 {titer}

## 2011-02-23 LAB — TSH: TSH: 45.557 — ABNORMAL HIGH

## 2011-02-23 LAB — BENZODIAZEPINE, QUANTITATIVE, URINE
Alprazolam (GC/LC/MS), ur confirm: NEGATIVE
Flurazepam GC/MS Conf: NEGATIVE

## 2011-02-23 LAB — D-DIMER, QUANTITATIVE: D-Dimer, Quant: 2.18 — ABNORMAL HIGH

## 2011-02-23 LAB — HCG, SERUM, QUALITATIVE: Preg, Serum: NEGATIVE

## 2011-02-23 LAB — SEDIMENTATION RATE: Sed Rate: 27 — ABNORMAL HIGH

## 2011-03-16 ENCOUNTER — Other Ambulatory Visit: Payer: Self-pay | Admitting: *Deleted

## 2011-03-16 MED ORDER — RANITIDINE HCL 300 MG PO TABS: 300.0000 mg | ORAL_TABLET | Freq: Every day | ORAL | Status: DC

## 2011-03-16 MED ORDER — CARVEDILOL 25 MG PO TABS: 25.0000 mg | ORAL_TABLET | Freq: Two times a day (BID) | ORAL | Status: DC

## 2011-03-16 MED ORDER — FUROSEMIDE 20 MG PO TABS: 20.0000 mg | ORAL_TABLET | Freq: Every day | ORAL | Status: DC

## 2011-05-13 ENCOUNTER — Other Ambulatory Visit: Payer: Self-pay | Admitting: Family Medicine

## 2011-05-13 DIAGNOSIS — Z139 Encounter for screening, unspecified: Secondary | ICD-10-CM

## 2011-05-20 ENCOUNTER — Ambulatory Visit (HOSPITAL_COMMUNITY)
Admission: RE | Admit: 2011-05-20 | Discharge: 2011-05-20 | Disposition: A | Discharge status: Home / Self Care | Payer: Medicare HMO | Source: Ambulatory Visit | Attending: Family Medicine | Admitting: Family Medicine

## 2011-05-20 DIAGNOSIS — Z139 Encounter for screening, unspecified: Secondary | ICD-10-CM

## 2011-05-20 DIAGNOSIS — Z1231 Encounter for screening mammogram for malignant neoplasm of breast: Secondary | ICD-10-CM | POA: Insufficient documentation

## 2011-05-21 ENCOUNTER — Other Ambulatory Visit: Payer: Self-pay

## 2011-05-21 ENCOUNTER — Telehealth: Payer: Self-pay | Admitting: Cardiology

## 2011-05-21 MED ORDER — CARVEDILOL 25 MG PO TABS: 25.0000 mg | ORAL_TABLET | Freq: Two times a day (BID) | ORAL | Status: DC

## 2011-05-21 MED ORDER — FUROSEMIDE 20 MG PO TABS: 20.0000 mg | ORAL_TABLET | Freq: Every day | ORAL | Status: DC

## 2011-05-21 NOTE — Telephone Encounter (Signed)
Needs refills on Furosemide and Carvedilol sent to [facility]. / tg

## 2011-06-30 ENCOUNTER — Ambulatory Visit (INDEPENDENT_AMBULATORY_CARE_PROVIDER_SITE_OTHER): Payer: Medicare Other | Admitting: Cardiology

## 2011-06-30 ENCOUNTER — Encounter: Payer: Self-pay | Admitting: Cardiology

## 2011-06-30 ENCOUNTER — Encounter: Payer: Self-pay | Admitting: *Deleted

## 2011-06-30 DIAGNOSIS — E785 Hyperlipidemia, unspecified: Secondary | ICD-10-CM

## 2011-06-30 DIAGNOSIS — I6529 Occlusion and stenosis of unspecified carotid artery: Secondary | ICD-10-CM

## 2011-06-30 DIAGNOSIS — I252 Old myocardial infarction: Secondary | ICD-10-CM

## 2011-06-30 DIAGNOSIS — R0989 Other specified symptoms and signs involving the circulatory and respiratory systems: Secondary | ICD-10-CM | POA: Insufficient documentation

## 2011-06-30 DIAGNOSIS — I10 Essential (primary) hypertension: Secondary | ICD-10-CM

## 2011-06-30 DIAGNOSIS — F17200 Nicotine dependence, unspecified, uncomplicated: Secondary | ICD-10-CM

## 2011-06-30 DIAGNOSIS — I2510 Atherosclerotic heart disease of native coronary artery without angina pectoris: Secondary | ICD-10-CM

## 2011-06-30 MED ORDER — NITROGLYCERIN 0.4 MG SL SUBL: 0.4000 mg | SUBLINGUAL_TABLET | SUBLINGUAL | Status: DC | PRN

## 2011-06-30 NOTE — Assessment & Plan Note (Signed)
Stable. No change in medications. Nitroglycerin renewed.

## 2011-06-30 NOTE — Assessment & Plan Note (Signed)
Encouraged to quit yet again

## 2011-06-30 NOTE — Progress Notes (Signed)
HPI 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   She denies any angina or chest discomfort. She does have some dyspnea on exertion but continues to smoke about half a pack of cigarettes a day.  Her weight is down and she says that her hemoglobin A1c is 6.5%. Her last LDL she remembers being around 54. This is followed by Dr. Tanya [name], her primary care physician.  She denies any orthopnea, PND, or edema.  Past Medical History  Diagnosis Date  . Hypertension   . Diabetes mellitus   . Hypothyroidism     Current Outpatient Prescriptions  Medication Sig Dispense Refill  . aspirin 325 MG tablet Take 325 mg by mouth daily.        Marland Kitchen atorvastatin (LIPITOR) 40 MG tablet Take 40 mg by mouth daily.        . carvedilol (COREG) 25 MG tablet Take 1 tablet (25 mg total) by mouth 2 (two) times daily with a meal.  180 tablet  0  . furosemide (LASIX) 20 MG tablet Take 1 tablet (20 mg total) by mouth daily.  90 tablet  0  . glipiZIDE (GLUCOTROL) 10 MG tablet Take 10 mg by mouth 2 (two) times daily before a meal.        . levothyroxine (SYNTHROID, LEVOTHROID) 175 MCG tablet Take 175 mcg by mouth daily.        Marland Kitchen losartan (COZAAR) 25 MG tablet Take 50 mg by mouth daily.       . metFORMIN (GLUCOPHAGE) 500 MG tablet Take 500 mg by mouth daily with breakfast.      . niacin (NIASPAN) 1000 MG CR tablet Take 1,000 mg by mouth 2 (two) times daily.      . nitroGLYCERIN (NITROSTAT) 0.4 MG SL tablet Place 0.4 mg under the tongue every 5 (five) minutes as needed.        . potassium chloride (KLOR-CON) 10 MEQ CR tablet Take 10 mEq by mouth daily.        . ranitidine (ZANTAC) 300 MG tablet Take 1 tablet (300 mg total) by mouth at  bedtime.  90 tablet  1    No Known Allergies  No family history on file.  History   Social History  . Marital Status: Legally Separated    Spouse Name: N/A    Number of Children: N/A  . Years of Education: N/A   Occupational History  . Not on file.   Social History Main Topics  . Smoking status: Current Everyday Smoker  . Smokeless tobacco: Not on file  . Alcohol Use: Not on file  . Drug Use: Not on file  . Sexually Active: Not on file   Other Topics Concern  . Not on file   Social History Narrative  . No narrative on file    ROS ALL NEGATIVE EXCEPT THOSE NOTED IN HPI  PE  General Appearance: well developed, well nourished in no acute distress, obese but has lost weight HEENT: symmetrical face, PERRLA, good dentition  Neck: no JVD, thyromegaly, or adenopathy, trachea midline Chest: symmetric without deformity Cardiac: PMI non-displaced, RRR, normal S1, S2, no gallop or murmur Lung: clear to ausculation and percussion Vascular: all pulses full, left carotid bruit Abdominal: nondistended, nontender, good bowel  sounds, no HSM, no bruits Extremities: no cyanosis, clubbing or edema, no sign of DVT, no varicosities  Skin: normal color, no rashes Neuro: alert and oriented x 3, non-focal Pysch: normal affect  EKG Normal sinus rhythm small Q waves inferiorly with no acute changes. BMET    Component Value Date/Time   NA 137 01/09/2009   K 4.4 01/09/2009   CL 104 01/09/2009   CO2 21 01/09/2009   GLUCOSE 106 01/09/2009   BUN 10 01/09/2009   CREATININE 0.86 10/04/2008   CALCIUM 9.3 01/09/2009   GFRNONAA >60 09/22/2007 0345   GFRAA  Value: >60        The eGFR has been calculated using the MDRD equation. This calculation has not been validated in all clinical 09/22/2007 0345    Lipid Panel     Component Value Date/Time   CHOL 192 01/09/2009   TRIG 265 01/09/2009   HDL 65 01/09/2009   CHOLHDL 9.0 09/12/2007 0330   VLDL 46* 09/12/2007 0330   LDLCALC 74 01/09/2009    CBC      Component Value Date/Time   WBC 11.3* 09/23/2007 0330   RBC 3.06* 09/23/2007 0330   HGB 10.1* 09/23/2007 0330   HCT 28.9* 09/23/2007 0330   PLT 272 09/23/2007 0330   MCV 94.3 09/23/2007 0330   MCHC 35.1 09/23/2007 0330   RDW 15.0 09/23/2007 0330   LYMPHSABS 5.9* 09/11/2007 2340   MONOABS 1.0 09/11/2007 2340   EOSABS 0.2 09/11/2007 2340   BASOSABS 0.1 09/11/2007 2340

## 2011-06-30 NOTE — Assessment & Plan Note (Signed)
LDL of 60 her primary care.

## 2011-06-30 NOTE — Patient Instructions (Addendum)
Your physician recommends that you schedule a follow-up appointment in: 12 months  Your physician has requested that you have a carotid duplex. This test is an ultrasound of the carotid arteries in your neck. It looks at blood flow through these arteries that supply the brain with blood. Allow one hour for this exam. There are no restrictions or special instructions.  Attempt to stop smoking

## 2011-06-30 NOTE — Assessment & Plan Note (Signed)
Improved. Encourage continued weight loss. Blood sugars under good control.

## 2011-06-30 NOTE — Assessment & Plan Note (Signed)
Controlled.  

## 2011-06-30 NOTE — Assessment & Plan Note (Signed)
Obtain carotid Dopplers. Continue secondary preventative care

## 2011-07-13 ENCOUNTER — Ambulatory Visit (HOSPITAL_COMMUNITY)
Admission: RE | Admit: 2011-07-13 | Discharge: 2011-07-13 | Disposition: A | Discharge status: Home / Self Care | Payer: Medicare Other | Source: Ambulatory Visit | Attending: Cardiology | Admitting: Cardiology

## 2011-07-13 DIAGNOSIS — I6529 Occlusion and stenosis of unspecified carotid artery: Secondary | ICD-10-CM | POA: Insufficient documentation

## 2011-07-13 DIAGNOSIS — E119 Type 2 diabetes mellitus without complications: Secondary | ICD-10-CM | POA: Insufficient documentation

## 2011-07-13 DIAGNOSIS — I10 Essential (primary) hypertension: Secondary | ICD-10-CM | POA: Insufficient documentation

## 2011-07-13 DIAGNOSIS — R0989 Other specified symptoms and signs involving the circulatory and respiratory systems: Secondary | ICD-10-CM | POA: Insufficient documentation

## 2011-07-16 ENCOUNTER — Telehealth: Payer: Self-pay

## 2011-07-16 ENCOUNTER — Other Ambulatory Visit: Payer: Self-pay

## 2011-07-20 ENCOUNTER — Other Ambulatory Visit: Payer: Self-pay | Admitting: *Deleted

## 2011-07-20 MED ORDER — FUROSEMIDE 20 MG PO TABS: 20.0000 mg | ORAL_TABLET | Freq: Every day | ORAL | Status: DC

## 2011-07-20 MED ORDER — CARVEDILOL 25 MG PO TABS: 25.0000 mg | ORAL_TABLET | Freq: Two times a day (BID) | ORAL | Status: DC

## 2011-07-20 MED ORDER — RANITIDINE HCL 300 MG PO TABS: 300.0000 mg | ORAL_TABLET | Freq: Every day | ORAL | Status: DC

## 2011-07-22 ENCOUNTER — Encounter: Payer: Self-pay | Admitting: Gastroenterology

## 2011-08-16 ENCOUNTER — Encounter: Payer: Medicare Other | Admitting: Gastroenterology

## 2011-08-19 ENCOUNTER — Other Ambulatory Visit: Payer: Self-pay | Admitting: Family Medicine

## 2011-08-19 DIAGNOSIS — E049 Nontoxic goiter, unspecified: Secondary | ICD-10-CM

## 2011-08-26 ENCOUNTER — Ambulatory Visit
Admission: RE | Admit: 2011-08-26 | Discharge: 2011-08-26 | Disposition: A | Discharge status: Home / Self Care | Payer: Medicare Other | Source: Ambulatory Visit | Attending: Family Medicine | Admitting: Family Medicine

## 2011-08-26 DIAGNOSIS — E049 Nontoxic goiter, unspecified: Secondary | ICD-10-CM

## 2011-08-30 HISTORY — PX: COLONOSCOPY: SHX174

## 2011-09-09 ENCOUNTER — Ambulatory Visit (AMBULATORY_SURGERY_CENTER): Payer: Medicare Other | Admitting: *Deleted

## 2011-09-09 ENCOUNTER — Encounter: Payer: Self-pay | Admitting: Gastroenterology

## 2011-09-09 VITALS — Ht 66.0 in | Wt 230.0 lb

## 2011-09-09 DIAGNOSIS — Z1211 Encounter for screening for malignant neoplasm of colon: Secondary | ICD-10-CM

## 2011-09-09 MED ORDER — PEG-KCL-NACL-NASULF-NA ASC-C 100 G PO SOLR: ORAL | Status: DC

## 2011-09-23 ENCOUNTER — Encounter: Payer: Self-pay | Admitting: Gastroenterology

## 2011-09-23 ENCOUNTER — Ambulatory Visit (AMBULATORY_SURGERY_CENTER): Payer: Medicare Other | Admitting: Gastroenterology

## 2011-09-23 VITALS — BP 145/74 | HR 69 | Temp 98.7°F | Resp 19 | Ht 66.0 in | Wt 230.0 lb

## 2011-09-23 DIAGNOSIS — Z1211 Encounter for screening for malignant neoplasm of colon: Secondary | ICD-10-CM

## 2011-09-23 LAB — GLUCOSE, CAPILLARY
Glucose-Capillary: 123 mg/dL — ABNORMAL HIGH (ref 70–99)
Glucose-Capillary: 104 mg/dL — ABNORMAL HIGH (ref 70–99)

## 2011-09-23 MED ORDER — SODIUM CHLORIDE 0.9 % IV SOLN: 500.0000 mL | INTRAVENOUS | Status: DC

## 2011-09-23 NOTE — Patient Instructions (Signed)
YOU HAD AN ENDOSCOPIC PROCEDURE TODAY AT THE [facility]: Refer to the procedure report that was given to you for any specific questions about what was found during the examination.  If the procedure report does not answer your questions, please call your gastroenterologist to clarify.  If you requested that your care partner not be given the details of your procedure findings, then the procedure report has been included in a sealed envelope for you to review at your convenience later.  YOU SHOULD EXPECT: Some feelings of bloating in the abdomen. Passage of more gas than usual.  Walking can help get rid of the air that was put into your GI tract during the procedure and reduce the bloating. If you had a lower endoscopy (such as a colonoscopy or flexible sigmoidoscopy) you may notice spotting of blood in your stool or on the toilet paper. If you underwent a bowel prep for your procedure, then you may not have a normal bowel movement for a few days.  DIET: Your first meal following the procedure should be a light meal and then it is ok to progress to your normal diet.  A half-sandwich or bowl of soup is an example of a good first meal.  Heavy or fried foods are harder to digest and may make you feel nauseous or bloated.  Likewise meals heavy in dairy and vegetables can cause extra gas to form and this can also increase the bloating.  Drink plenty of fluids but you should avoid alcoholic beverages for 24 hours.  ACTIVITY: Your care partner should take you home directly after the procedure.  You should plan to take it easy, moving slowly for the rest of the day.  You can resume normal activity the day after the procedure however you should NOT DRIVE or use heavy machinery for 24 hours (because of the sedation medicines used during the test).    SYMPTOMS TO REPORT IMMEDIATELY: A gastroenterologist can be reached at any hour.  During normal business hours, 8:30 AM to 5:00 PM Monday through Friday,  call [phone].  After hours and on weekends, please call the GI answering service at [phone] who will take a message and have the physician on call contact you.   Following lower endoscopy (colonoscopy or flexible sigmoidoscopy):  Excessive amounts of blood in the stool  Significant tenderness or worsening of abdominal pains  Swelling of the abdomen that is new, acute  Fever of 100F or higher    FOLLOW UP: If any biopsies were taken you will be contacted by phone or by letter within the next 1-3 weeks.  Call your gastroenterologist if you have not heard about the biopsies in 3 weeks.  Our staff will call the home number listed on your records the next business day following your procedure to check on you and address any questions or concerns that you may have at that time regarding the information given to you following your procedure. This is a courtesy call and so if there is no answer at the home number and we have not heard from you through the emergency physician on call, we will assume that you have returned to your regular daily activities without incident.  SIGNATURES/CONFIDENTIALITY: You and/or your care partner have signed paperwork which will be entered into your electronic medical record.  These signatures attest to the fact that that the information above on your After Visit Summary has been reviewed and is understood.  Full responsibility of the confidentiality   of this discharge information lies with you and/or your care-partner.     

## 2011-09-23 NOTE — Op Note (Signed)
[facility] [name] [place], [place]  [address]  COLONOSCOPY PROCEDURE REPORT  PATIENT:  [name], [name]  MR#:  [id number] BIRTHDATE:  07-19-1960, 50 yrs. old  GENDER:  female ENDOSCOPIST:  Vania Rea. Jarold Motto, MD, [facility] REF. BY:  Lynnea Ferrier, M.D. PROCEDURE DATE:  09/23/2011 PROCEDURE:  Average-risk screening colonoscopy G0121 ASA CLASS:  Class III INDICATIONS:  colorectal cancer screening, average risk MEDICATIONS:   propofol (Diprivan) 200 mg IV  DESCRIPTION OF PROCEDURE:   After the risks and benefits and of the procedure were explained, informed consent was obtained. Digital rectal exam was performed and revealed no abnormalities. The LB [id number] endoscope was introduced through the anus and advanced to the cecum, which was identified by both the appendix and ileocecal valve.  The quality of the prep was adequate, using MoviPrep.  The instrument was then slowly withdrawn as the colon was fully examined. <<PROCEDUREIMAGES>>  FINDINGS:  No polyps or cancers were seen.  This was otherwise a normal examination of the colon.   Retroflexed views in the rectum revealed no abnormalities.    The scope was then withdrawn from the patient and the procedure completed.  COMPLICATIONS:  None ENDOSCOPIC IMPRESSION: 1) No polyps or cancers 2) Otherwise normal examination RECOMMENDATIONS: 1) Continue current colorectal screening recommendations for "routine risk" patients with a repeat colonoscopy in 10 years.  REPEAT EXAM:  No  ______________________________ Vania Rea. Jarold Motto, MD, [facility]  CC:  n. eSIGNED:   Vania Rea. Jamael Hoffmann at 09/23/2011 09:58 AM  [name], [phone]

## 2011-09-23 NOTE — Progress Notes (Signed)
Patient did not have preoperative order for IV antibiotic SSI prophylaxis. (G8918)  Patient did not experience any of the following events: a burn prior to discharge; a fall within the facility; wrong site/side/patient/procedure/implant event; or a hospital transfer or hospital admission upon discharge from the facility. (G8907)  

## 2011-09-24 ENCOUNTER — Telehealth: Payer: Self-pay | Admitting: *Deleted

## 2011-09-24 NOTE — Telephone Encounter (Signed)
No answer left message to call office if questions or concerns. 

## 2012-03-28 ENCOUNTER — Other Ambulatory Visit: Payer: Self-pay | Admitting: *Deleted

## 2012-03-28 MED ORDER — RANITIDINE HCL 300 MG PO TABS: 300.0000 mg | ORAL_TABLET | Freq: Every day | ORAL | Status: DC

## 2012-04-10 ENCOUNTER — Other Ambulatory Visit: Payer: Self-pay | Admitting: Family Medicine

## 2012-04-10 DIAGNOSIS — Z139 Encounter for screening, unspecified: Secondary | ICD-10-CM

## 2012-05-22 ENCOUNTER — Ambulatory Visit (HOSPITAL_COMMUNITY)
Admission: RE | Admit: 2012-05-22 | Discharge: 2012-05-22 | Disposition: A | Discharge status: Home / Self Care | Payer: Medicare Other | Source: Ambulatory Visit | Attending: Family Medicine | Admitting: Family Medicine

## 2012-05-22 DIAGNOSIS — Z139 Encounter for screening, unspecified: Secondary | ICD-10-CM

## 2012-05-22 DIAGNOSIS — Z1231 Encounter for screening mammogram for malignant neoplasm of breast: Secondary | ICD-10-CM | POA: Insufficient documentation

## 2012-06-14 ENCOUNTER — Ambulatory Visit (INDEPENDENT_AMBULATORY_CARE_PROVIDER_SITE_OTHER): Payer: Medicare Other | Admitting: Cardiology

## 2012-06-14 ENCOUNTER — Encounter: Payer: Self-pay | Admitting: Cardiology

## 2012-06-14 VITALS — BP 130/70 | HR 75 | Ht 68.0 in | Wt 251.1 lb

## 2012-06-14 DIAGNOSIS — R0989 Other specified symptoms and signs involving the circulatory and respiratory systems: Secondary | ICD-10-CM

## 2012-06-14 DIAGNOSIS — I10 Essential (primary) hypertension: Secondary | ICD-10-CM

## 2012-06-14 DIAGNOSIS — I2510 Atherosclerotic heart disease of native coronary artery without angina pectoris: Secondary | ICD-10-CM

## 2012-06-14 DIAGNOSIS — E785 Hyperlipidemia, unspecified: Secondary | ICD-10-CM

## 2012-06-14 DIAGNOSIS — F39 Unspecified mood [affective] disorder: Secondary | ICD-10-CM

## 2012-06-14 DIAGNOSIS — F17200 Nicotine dependence, unspecified, uncomplicated: Secondary | ICD-10-CM

## 2012-06-14 DIAGNOSIS — I252 Old myocardial infarction: Secondary | ICD-10-CM

## 2012-06-14 NOTE — Progress Notes (Signed)
HPI [name] returns today for evaluation and management of her history of coronary artery disease and multiple cardiac risk factors. She also has nonobstructive carotid disease.  She offers no complaints today. She specifically denies any chest discomfort or angina. She denies orthopnea, PND or change in her chronic edema.  Dr. Tanya Nones her primary care is monitoring her blood work. Her last hemoglobin A1c she says was 6.5%.  She continues to smoke about a half-pack cigarettes a day. She has not lost any weight. She seems to be compliant with her medications.  Past Medical History  Diagnosis Date  . Hypertension   . Diabetes mellitus   . Arthritis     knees  . Blood transfusion 08/2007  . CHF (congestive heart failure)   . GERD (gastroesophageal reflux disease)   . Hyperlipidemia   . Hypothyroidism     Current Outpatient Prescriptions  Medication Sig Dispense Refill  . aspirin 325 MG tablet Take 325 mg by mouth daily.        Marland Kitchen atorvastatin (LIPITOR) 40 MG tablet Take 40 mg by mouth daily.        . carvedilol (COREG) 25 MG tablet Take 1 tablet (25 mg total) by mouth 2 (two) times daily with a meal.  180 tablet  2  . cyclobenzaprine (FLEXERIL) 5 MG tablet Take 5 mg by mouth every 8 (eight) hours as needed.      . furosemide (LASIX) 20 MG tablet Take 1 tablet (20 mg total) by mouth daily.  90 tablet  2  . glipiZIDE (GLUCOTROL) 10 MG tablet Take 10 mg by mouth 2 (two) times daily before a meal.        . levothyroxine (SYNTHROID, LEVOTHROID) 175 MCG tablet Take 150 mcg by mouth daily.       Marland Kitchen losartan (COZAAR) 25 MG tablet Take 50 mg by mouth daily.       . metFORMIN (GLUCOPHAGE) 500 MG tablet Take 500 mg by mouth daily with breakfast.      . niacin (NIASPAN) 1000 MG CR tablet Take 2,000 mg by mouth at bedtime.       . nitroGLYCERIN (NITROSTAT) 0.4 MG SL tablet Place 1 tablet (0.4 mg total) under the tongue every 5 (five) minutes as needed.  100 tablet  3  . potassium chloride  (KLOR-CON) 10 MEQ CR tablet Take 10 mEq by mouth daily.        . ranitidine (ZANTAC) 300 MG tablet Take 1 tablet (300 mg total) by mouth at bedtime.  90 tablet  1    No Known Allergies  No family history on file.  History   Social History  . Marital Status: Legally Separated    Spouse Name: N/A    Number of Children: N/A  . Years of Education: N/A   Occupational History  . Not on file.   Social History Main Topics  . Smoking status: Current Every Day Smoker -- 0.8 packs/day  . Smokeless tobacco: Never Used  . Alcohol Use: No  . Drug Use: No  . Sexually Active: Not on file   Other Topics Concern  . Not on file   Social History Narrative  . No narrative on file    ROS ALL NEGATIVE EXCEPT THOSE NOTED IN HPI  PE  General Appearance: well developed, well nourished in no acute distress, obese HEENT: symmetrical face, PERRLA, good dentition  Neck: no JVD, thyromegaly, or adenopathy, trachea midline Chest: symmetric without deformity Cardiac: PMI poorly appreciated, RRR, normal  S1, S2, no gallop or murmur Lung: clear to ausculation and percussion Vascular: all pulses full with left carotid bruit  Abdominal: nondistended, nontender, good bowel sounds, no HSM, no bruits Extremities: no cyanosis, clubbing or edema, no sign of DVT, no varicosities  Skin: normal color, no rashes Neuro: alert and oriented x 3, non-focal Pysch: normal affect  EKG Normal sinus rhythm, Qs in 3 and aVF, no acute changes BMET    Component Value Date/Time   NA 137 01/09/2009   K 4.4 01/09/2009   CL 104 01/09/2009   CO2 21 01/09/2009   GLUCOSE 106 01/09/2009   BUN 10 01/09/2009   CREATININE 0.86 10/04/2008   CALCIUM 9.3 01/09/2009   GFRNONAA >60 09/22/2007 0345   GFRAA  Value: >60        The eGFR has been calculated using the MDRD equation. This calculation has not been validated in all clinical 09/22/2007 0345    Lipid Panel     Component Value Date/Time   CHOL 192 01/09/2009   TRIG 265  01/09/2009   HDL 65 01/09/2009   CHOLHDL 9.0 09/12/2007 0330   VLDL 46* 09/12/2007 0330   LDLCALC 74 01/09/2009    CBC    Component Value Date/Time   WBC 11.3* 09/23/2007 0330   RBC 3.06* 09/23/2007 0330   HGB 10.1* 09/23/2007 0330   HCT 28.9* 09/23/2007 0330   PLT 272 09/23/2007 0330   MCV 94.3 09/23/2007 0330   MCHC 35.1 09/23/2007 0330   RDW 15.0 09/23/2007 0330   LYMPHSABS 5.9* 09/11/2007 2340   MONOABS 1.0 09/11/2007 2340   EOSABS 0.2 09/11/2007 2340   BASOSABS 0.1 09/11/2007 2340

## 2012-06-14 NOTE — Patient Instructions (Addendum)
Your physician recommends that you schedule a follow-up appointment in: 1 year  

## 2012-06-14 NOTE — Assessment & Plan Note (Signed)
Encouraged her to quit 

## 2012-06-14 NOTE — Assessment & Plan Note (Signed)
Encouraged to stay active and lose weight. This has been very difficult for her.

## 2012-06-14 NOTE — Assessment & Plan Note (Signed)
Good control. Continue healthy diet and activity. Try not to gain anymore weight

## 2012-06-14 NOTE — Assessment & Plan Note (Signed)
No change. Asymptomatic. Continue secondary preventative therapy. Quit smoking.

## 2012-06-14 NOTE — Assessment & Plan Note (Signed)
Stable. Continue secondary preventative treatment. Encouraged patient to quit smoking. I'll see her back in the office in the year.

## 2012-06-21 ENCOUNTER — Other Ambulatory Visit: Payer: Self-pay | Admitting: *Deleted

## 2012-06-21 MED ORDER — CARVEDILOL 25 MG PO TABS: 25.0000 mg | ORAL_TABLET | Freq: Two times a day (BID) | ORAL | Status: DC

## 2012-06-22 ENCOUNTER — Other Ambulatory Visit: Payer: Self-pay | Admitting: *Deleted

## 2012-06-22 MED ORDER — CARVEDILOL 25 MG PO TABS: 25.0000 mg | ORAL_TABLET | Freq: Two times a day (BID) | ORAL | Status: DC

## 2012-08-30 ENCOUNTER — Telehealth: Payer: Self-pay | Admitting: Family Medicine

## 2012-08-30 MED ORDER — NITROGLYCERIN 0.4 MG SL SUBL: 0.4000 mg | SUBLINGUAL_TABLET | SUBLINGUAL | Status: DC | PRN

## 2012-08-30 NOTE — Telephone Encounter (Signed)
rx refilled.

## 2012-09-08 ENCOUNTER — Other Ambulatory Visit: Payer: Self-pay | Admitting: Family Medicine

## 2012-09-08 MED ORDER — LOSARTAN POTASSIUM 25 MG PO TABS: 50.0000 mg | ORAL_TABLET | Freq: Every day | ORAL | Status: DC

## 2012-09-08 MED ORDER — NIACIN ER (ANTIHYPERLIPIDEMIC) 1000 MG PO TBCR: 2000.0000 mg | EXTENDED_RELEASE_TABLET | Freq: Every day | ORAL | Status: DC

## 2012-09-08 MED ORDER — GLIPIZIDE 10 MG PO TABS: 10.0000 mg | ORAL_TABLET | Freq: Two times a day (BID) | ORAL | Status: DC

## 2012-09-08 MED ORDER — METFORMIN HCL 500 MG PO TABS: 500.0000 mg | ORAL_TABLET | Freq: Every day | ORAL | Status: DC

## 2012-09-08 MED ORDER — ROSUVASTATIN CALCIUM 20 MG PO TABS: 20.0000 mg | ORAL_TABLET | Freq: Every day | ORAL | Status: DC

## 2012-09-08 MED ORDER — POTASSIUM CHLORIDE CRYS ER 10 MEQ PO TBCR: 10.0000 meq | EXTENDED_RELEASE_TABLET | Freq: Every day | ORAL | Status: DC

## 2012-09-08 NOTE — Telephone Encounter (Signed)
Pt needs to be seen before further refills

## 2012-09-11 ENCOUNTER — Other Ambulatory Visit: Payer: Self-pay | Admitting: *Deleted

## 2012-09-11 MED ORDER — RANITIDINE HCL 300 MG PO TABS: 300.0000 mg | ORAL_TABLET | Freq: Every day | ORAL | Status: DC

## 2012-09-14 ENCOUNTER — Telehealth: Payer: Self-pay | Admitting: Family Medicine

## 2012-09-14 MED ORDER — LOSARTAN POTASSIUM 25 MG PO TABS: 50.0000 mg | ORAL_TABLET | Freq: Every day | ORAL | Status: DC

## 2012-09-14 MED ORDER — NIACIN ER (ANTIHYPERLIPIDEMIC) 1000 MG PO TBCR: 2000.0000 mg | EXTENDED_RELEASE_TABLET | Freq: Every day | ORAL | Status: DC

## 2012-09-14 MED ORDER — GLIPIZIDE 10 MG PO TABS: 10.0000 mg | ORAL_TABLET | Freq: Two times a day (BID) | ORAL | Status: DC

## 2012-09-14 MED ORDER — METFORMIN HCL 500 MG PO TABS: 500.0000 mg | ORAL_TABLET | Freq: Every day | ORAL | Status: DC

## 2012-09-14 MED ORDER — POTASSIUM CHLORIDE CRYS ER 10 MEQ PO TBCR: 10.0000 meq | EXTENDED_RELEASE_TABLET | Freq: Every day | ORAL | Status: DC

## 2012-09-14 MED ORDER — ROSUVASTATIN CALCIUM 20 MG PO TABS: 20.0000 mg | ORAL_TABLET | Freq: Every day | ORAL | Status: DC

## 2012-09-14 MED ORDER — RANITIDINE HCL 300 MG PO TABS: 300.0000 mg | ORAL_TABLET | Freq: Every day | ORAL | Status: DC

## 2012-09-14 NOTE — Telephone Encounter (Signed)
Medication refilled per protocol.Patient needs to be seen before any further refills 

## 2012-09-18 ENCOUNTER — Telehealth: Payer: Self-pay | Admitting: Family Medicine

## 2012-09-18 ENCOUNTER — Encounter: Payer: Self-pay | Admitting: Family Medicine

## 2012-09-18 MED ORDER — LOSARTAN POTASSIUM 25 MG PO TABS: ORAL_TABLET | ORAL | Status: DC

## 2012-09-18 NOTE — Telephone Encounter (Signed)
Losartan 25 mg take 2 tablets by mouth daily wants 90 day supply last rf 09/14/12

## 2012-09-18 NOTE — Telephone Encounter (Signed)
..  Rx Refilled Letter mailed to pt that ov is require for next refills

## 2012-10-10 ENCOUNTER — Ambulatory Visit (INDEPENDENT_AMBULATORY_CARE_PROVIDER_SITE_OTHER): Payer: Medicare Other | Admitting: Family Medicine

## 2012-10-10 ENCOUNTER — Encounter: Payer: Self-pay | Admitting: Family Medicine

## 2012-10-10 VITALS — BP 122/74 | HR 70 | Temp 97.9°F | Resp 18 | Wt 254.0 lb

## 2012-10-10 DIAGNOSIS — E785 Hyperlipidemia, unspecified: Secondary | ICD-10-CM

## 2012-10-10 DIAGNOSIS — E039 Hypothyroidism, unspecified: Secondary | ICD-10-CM

## 2012-10-10 DIAGNOSIS — I10 Essential (primary) hypertension: Secondary | ICD-10-CM

## 2012-10-10 DIAGNOSIS — E1149 Type 2 diabetes mellitus with other diabetic neurological complication: Secondary | ICD-10-CM

## 2012-10-10 LAB — BASIC METABOLIC PANEL
CO2: 26 mEq/L (ref 19–32)
Calcium: 10.1 mg/dL (ref 8.4–10.5)
Glucose, Bld: 136 mg/dL — ABNORMAL HIGH (ref 70–99)
Potassium: 5.1 mEq/L (ref 3.5–5.3)
Sodium: 141 mEq/L (ref 135–145)

## 2012-10-10 LAB — LIPID PANEL
LDL Cholesterol: 63 mg/dL (ref 0–99)
VLDL: 38 mg/dL (ref 0–40)

## 2012-10-10 LAB — TSH: TSH: 2.068 u[IU]/mL (ref 0.350–4.500)

## 2012-10-10 LAB — HEPATIC FUNCTION PANEL
ALT: 13 U/L (ref 0–35)
AST: 17 U/L (ref 0–37)
Bilirubin, Direct: 0.1 mg/dL (ref 0.0–0.3)
Indirect Bilirubin: 0.2 mg/dL (ref 0.0–0.9)

## 2012-10-10 NOTE — Progress Notes (Signed)
Subjective:    Patient ID: [name], female    DOB: 1960/08/31, 52 y.o.   MRN: [id number]  HPI  Patient is here for followup of her diabetes mellitus type 2. She's currently taking metformin 500 mg by mouth daily, and glipizide 10 mg by mouth twice a day. She is not checking her sugars regularly. Since her last office visit she has gained 14 pounds. She has not been exercising over the winter. She denies any blurred vision, polyuria, and polydipsia. She does report some burning in her feet. She has a history of hypertension. Her blood pressure is currently well controlled. She denies any chest pain, shortness of breath, or dyspnea on exertion. She also has a history of hyperlipidemia. She is taking Crestor 20 mg by mouth daily.  She denies myalgias or right upper quadrant pain. She also has hypothyroidism. She levothyroxine 150 mcg by mouth daily. She has noticed a weight gain as a discussed earlier. Otherwise she is asymptomatic. She sees optho. regularly every October. Past Medical History  Diagnosis Date  . Hypertension   . Diabetes mellitus   . Arthritis     knees  . Blood transfusion 08/2007  . CHF (congestive heart failure)   . GERD (gastroesophageal reflux disease)   . Hyperlipidemia   . Hypothyroidism    Current Outpatient Prescriptions on File Prior to Visit  Medication Sig Dispense Refill  . aspirin 325 MG tablet Take 325 mg by mouth daily.        . carvedilol (COREG) 25 MG tablet Take 1 tablet (25 mg total) by mouth 2 (two) times daily with a meal.  180 tablet  3  . cyclobenzaprine (FLEXERIL) 5 MG tablet Take 5 mg by mouth every 8 (eight) hours as needed.      . furosemide (LASIX) 20 MG tablet Take 1 tablet (20 mg total) by mouth daily.  90 tablet  2  . glipiZIDE (GLUCOTROL) 10 MG tablet Take 1 tablet (10 mg total) by mouth 2 (two) times daily before a meal.  180 tablet  0  . levothyroxine (SYNTHROID, LEVOTHROID) 175 MCG tablet Take 150 mcg by mouth daily.       Marland Kitchen  losartan (COZAAR) 25 MG tablet 2 tabs po qd  180 tablet  0  . metFORMIN (GLUCOPHAGE) 500 MG tablet Take 1 tablet (500 mg total) by mouth daily with breakfast.  90 tablet  0  . niacin (NIASPAN) 1000 MG CR tablet Take 2 tablets (2,000 mg total) by mouth at bedtime.  180 tablet  0  . nitroGLYCERIN (NITROSTAT) 0.4 MG SL tablet Place 1 tablet (0.4 mg total) under the tongue every 5 (five) minutes as needed.  100 tablet  3  . potassium chloride (K-DUR,KLOR-CON) 10 MEQ tablet Take 1 tablet (10 mEq total) by mouth daily.  90 tablet  0  . ranitidine (ZANTAC) 300 MG tablet Take 1 tablet (300 mg total) by mouth at bedtime.  90 tablet  0  . rosuvastatin (CRESTOR) 20 MG tablet Take 1 tablet (20 mg total) by mouth daily.  90 tablet  0   No current facility-administered medications on file prior to visit.   No Known Allergies History   Social History  . Marital Status: Legally Separated    Spouse Name: N/A    Number of Children: N/A  . Years of Education: N/A   Occupational History  . Not on file.   Social History Main Topics  . Smoking status: Current Every Day Smoker --  0.80 packs/day  . Smokeless tobacco: Never Used  . Alcohol Use: No  . Drug Use: No  . Sexually Active: Not on file   Other Topics Concern  . Not on file   Social History Narrative  . No narrative on file    Review of Systems  All other systems reviewed and are negative.       Objective:   Physical Exam  Neck: Neck supple. No JVD present. No thyromegaly present.  Cardiovascular: Normal rate, regular rhythm and normal heart sounds.   No murmur heard. Pulmonary/Chest: Effort normal and breath sounds normal. No respiratory distress. She has no wheezes. She has no rales. She exhibits no tenderness.  Abdominal: Soft. Bowel sounds are normal. She exhibits no distension. There is no tenderness. There is no rebound.  Lymphadenopathy:    She has no cervical adenopathy.      Diabetic foot exam was performed     Assessment & Plan:  1. HYPOTHYROIDISM - TSH  2. HYPERLIPIDEMIA-MIXED Goal LDL is less than 70 - Hepatic Function Panel - Lipid Panel  3. HYPERTENSION Blood pressure is currently at goal. Continue current medications. - Basic Metabolic Panel  4. Type II or unspecified type diabetes mellitus with neurological manifestations, not stated as uncontrolled(250.60) Advise low carbohydrate diet, increasing aerobic exercise to 30 minutes a day 5 days a week. Check hemoglobin A1c. Her goal A1c less than 6.5. Based on her abnormal foot exam and her neuropathy, I will consult podiatry for diabetic shoes. - Hemoglobin A1c - Ambulatory referral to Podiatry

## 2012-11-01 ENCOUNTER — Ambulatory Visit (INDEPENDENT_AMBULATORY_CARE_PROVIDER_SITE_OTHER): Payer: Medicare Other | Admitting: Podiatry

## 2012-11-01 ENCOUNTER — Encounter: Payer: Self-pay | Admitting: Podiatry

## 2012-11-01 VITALS — BP 119/64 | HR 67

## 2012-11-01 DIAGNOSIS — B351 Tinea unguium: Secondary | ICD-10-CM

## 2012-11-01 DIAGNOSIS — M21969 Unspecified acquired deformity of unspecified lower leg: Secondary | ICD-10-CM

## 2012-11-01 DIAGNOSIS — E1149 Type 2 diabetes mellitus with other diabetic neurological complication: Secondary | ICD-10-CM

## 2012-11-01 DIAGNOSIS — M25579 Pain in unspecified ankle and joints of unspecified foot: Secondary | ICD-10-CM

## 2012-11-01 NOTE — Progress Notes (Signed)
SUBJECTIVE: 52 y.o. year old female presents complaining of burning feet after been on for about 15 minutes. Also has painful calluses and toe nails.  Stated that her blood sugar was 127 this morning, and her HbA1c stays under 6.5, which was done last week. She as been diagnosed for diabetes last 5 years. Patient is disabled from DM, CHF, and Thyroid problems. Patient wanted to know how she could get a pair of diabetic shoes.  Patient is referred by Dr. Tanya Nones.   REVIEW OF SYSTEMS: Constitutional: positive for Occasional low grade fever and night sweats. Eyes: Have blurred vision depending on background colors. Ears, nose, mouth, throat, and face: Eears always run with itch and flakes. Respiratory: negative Cardiovascular: negative Gastrointestinal: negative Musculoskeletal:Aching joints in elbows. Neurological: negative Allergic/Immunologic: negative  OBJECTIVE: DERMATOLOGIC EXAMINATION: Nails: Hypertrophic and dystrophic x 10.  Presence of plantar callus under 2nd MPJ area bilateral. Multiple porokeratotic lesions on plantar mid foot area bilateral.   VASCULAR EXAMINATION OF LOWER LIMBS: Pedal pulses: Dorsalis Pedis artery: Not palpable on both feet. Posterior Tibial artery: Faintly palpable on both feet.  Capillary Filling times within 3 seconds in all digits.  No visible edema or erythema noted. No signs of ischemic changes on lower limbs.   NEUROLOGIC EXAMINATION OF THE LOWER LIMBS: Achilles DTR is present and within normal. Decreased response to Monofilament (Semmes-Weinstein 10-gm) sensory testing positive response 3 out of 6, bilateral. Vibratory sensations(128Hz  turning fork) intact at medial and lateral forefoot bilateral.  Sharp and Dull discriminatory sensations at the plantar ball of hallux is intact bilateral.   MUSCULOSKELETAL EXAMINATION: Positive for tight Achilles tendon bilateral. Positive of excess first Metatarsocuneiform joint motion bilateral.  Prominent  2nd MPJ at plantar aspect upon loading of forefoot.   ASSESSMENT: 1. Painful plantar calluses under 2nd Metatarsophalangeal joint area bilateral. 2. Onychomycosis x 10. 3. Lesser metatarsalgia secondary to lateral weight shifting, result of hypermobile first ray during weight bearing.  4. Type II NIDDM with Neurologic manifestation.  PLAN: Reviewed clinical findings and available treatment options, such as diabetic foot care q 3 months, stretch exercise, change in shoe gear.  Debrided plantar calluses bilateral. Debrided Nails x 10. Patient took a form to get approval from her PCP for DM shoes.

## 2012-11-15 ENCOUNTER — Ambulatory Visit (INDEPENDENT_AMBULATORY_CARE_PROVIDER_SITE_OTHER): Payer: Medicare Other | Admitting: Podiatry

## 2012-11-15 DIAGNOSIS — M25579 Pain in unspecified ankle and joints of unspecified foot: Secondary | ICD-10-CM

## 2012-11-15 DIAGNOSIS — M21969 Unspecified acquired deformity of unspecified lower leg: Secondary | ICD-10-CM

## 2012-11-15 DIAGNOSIS — E1149 Type 2 diabetes mellitus with other diabetic neurological complication: Secondary | ICD-10-CM

## 2012-11-15 NOTE — Progress Notes (Signed)
Subjective: Patient came in to have her feet fitted for diabetic shoes.  Established diagnosis include; 1. Painful plantar calluses under 2nd Metatarsophalangeal joint area bilateral.  2. Onychomycosis x 10.  3. Lesser metatarsalgia secondary to lateral weight shifting, result of hypermobile first ray during weight bearing.  4. Type II NIDDM with Neurologic manifestation.  Treatment: Both feet were measured and order placed. Trimmed a small corn on 4th digit, in between toes,  lateral aspect left foot.

## 2012-12-18 ENCOUNTER — Encounter: Payer: Self-pay | Admitting: Family Medicine

## 2012-12-18 ENCOUNTER — Telehealth: Payer: Self-pay | Admitting: Family Medicine

## 2012-12-18 MED ORDER — LOSARTAN POTASSIUM 50 MG PO TABS: 50.0000 mg | ORAL_TABLET | Freq: Every day | ORAL | Status: DC

## 2012-12-18 NOTE — Telephone Encounter (Signed)
.  Rx Refilled, pt needs ov and bw letter sent

## 2013-01-02 ENCOUNTER — Telehealth: Payer: Self-pay | Admitting: Family Medicine

## 2013-01-02 MED ORDER — METFORMIN HCL 500 MG PO TABS: 500.0000 mg | ORAL_TABLET | Freq: Every day | ORAL | Status: DC

## 2013-01-02 MED ORDER — POTASSIUM CHLORIDE CRYS ER 10 MEQ PO TBCR: 10.0000 meq | EXTENDED_RELEASE_TABLET | Freq: Every day | ORAL | Status: DC

## 2013-01-02 MED ORDER — NIACIN ER (ANTIHYPERLIPIDEMIC) 1000 MG PO TBCR: 2000.0000 mg | EXTENDED_RELEASE_TABLET | Freq: Every day | ORAL | Status: DC

## 2013-01-02 MED ORDER — GLIPIZIDE 10 MG PO TABS: 10.0000 mg | ORAL_TABLET | Freq: Two times a day (BID) | ORAL | Status: DC

## 2013-01-02 NOTE — Telephone Encounter (Signed)
Klor-Con 10 meq 1 QD #90 Metformin 500 mg 1 QD #90 Nispan ER 1000 mg 2 QHS #180 Glipizide 10 mg 1 BID #180

## 2013-01-02 NOTE — Telephone Encounter (Signed)
Rx's  Refilled  

## 2013-02-20 ENCOUNTER — Encounter: Payer: Self-pay | Admitting: Family Medicine

## 2013-02-20 ENCOUNTER — Ambulatory Visit (INDEPENDENT_AMBULATORY_CARE_PROVIDER_SITE_OTHER): Payer: Medicare Other | Admitting: Family Medicine

## 2013-02-20 VITALS — BP 136/82 | HR 74 | Temp 98.0°F | Resp 18 | Ht 66.0 in | Wt 236.0 lb

## 2013-02-20 DIAGNOSIS — Z23 Encounter for immunization: Secondary | ICD-10-CM

## 2013-02-20 DIAGNOSIS — E785 Hyperlipidemia, unspecified: Secondary | ICD-10-CM

## 2013-02-20 DIAGNOSIS — E039 Hypothyroidism, unspecified: Secondary | ICD-10-CM

## 2013-02-20 DIAGNOSIS — I70219 Atherosclerosis of native arteries of extremities with intermittent claudication, unspecified extremity: Secondary | ICD-10-CM

## 2013-02-20 DIAGNOSIS — E119 Type 2 diabetes mellitus without complications: Secondary | ICD-10-CM

## 2013-02-20 DIAGNOSIS — I10 Essential (primary) hypertension: Secondary | ICD-10-CM

## 2013-02-20 LAB — COMPLETE METABOLIC PANEL WITH GFR
AST: 14 U/L (ref 0–37)
BUN: 17 mg/dL (ref 6–23)
Calcium: 10.2 mg/dL (ref 8.4–10.5)
Chloride: 103 mEq/L (ref 96–112)
Creat: 0.82 mg/dL (ref 0.50–1.10)
GFR, Est Non African American: 83 mL/min
Glucose, Bld: 126 mg/dL — ABNORMAL HIGH (ref 70–99)

## 2013-02-20 LAB — TSH: TSH: 0.371 u[IU]/mL (ref 0.350–4.500)

## 2013-02-20 LAB — LIPID PANEL
Cholesterol: 118 mg/dL (ref 0–200)
Triglycerides: 171 mg/dL — ABNORMAL HIGH (ref <150)
VLDL: 34 mg/dL (ref 0–40)

## 2013-02-20 NOTE — Progress Notes (Signed)
Subjective:    Patient ID: [name], female    DOB: [birth year], 52 y.o.   MRN: [id number]  HPI Patient is here today for a followup of her multiple medical problems. She has hypothyroidism currently treated with levothyroxine 175 mcg by mouth daily. She denies depression or fatigue.  Chest has a history of hypertension. She is only taking Coreg 25 mg by mouth twice a day, Lasix 20 mg by mouth daily, Cozaar 50 mg by mouth daily.  She denies chest pain or shortness of breath or dyspnea on exertion. She continues to smoke half a pack a day. Credit, she is trying to exercise on a daily basis by riding a bicycle. Unfortunately she developed weakness in the legs and cramping pain in the legs with activity. She is taking aspirin 325 mg by mouth daily. She also has diabetes mellitus type 2 his she's currently on glipizide 10 mg by mouth twice a day and metformin 500 mg by mouth every morning. Her fasting blood sugars are less than 1:30 and her 2 postprandial sugars are less than 160 per her report. She also has hyperlipidemia she is currently taking Crestor 20 mg by mouth daily. She denies myalgia or right upper quadrant pain. Her past medical history is significant for a myocardial infarction. Past Medical History  Diagnosis Date  . Hypertension   . Diabetes mellitus   . Arthritis     knees  . Blood transfusion 08/2007  . CHF (congestive heart failure)   . GERD (gastroesophageal reflux disease)   . Hyperlipidemia   . Hypothyroidism    No past surgical history on file. Current Outpatient Prescriptions on File Prior to Visit  Medication Sig Dispense Refill  . aspirin 325 MG tablet Take 325 mg by mouth daily.        . carvedilol (COREG) 25 MG tablet Take 1 tablet (25 mg total) by mouth 2 (two) times daily with a meal.  180 tablet  3  . cyclobenzaprine (FLEXERIL) 5 MG tablet Take 5 mg by mouth every 8 (eight) hours as needed.      . furosemide (LASIX) 20 MG tablet Take 1 tablet (20 mg total) by  mouth daily.  90 tablet  2  . glipiZIDE (GLUCOTROL) 10 MG tablet Take 1 tablet (10 mg total) by mouth 2 (two) times daily before a meal.  180 tablet  1  . levothyroxine (SYNTHROID, LEVOTHROID) 175 MCG tablet Take 150 mcg by mouth daily.       Marland Kitchen losartan (COZAAR) 50 MG tablet Take 1 tablet (50 mg total) by mouth daily.  90 tablet  0  . metFORMIN (GLUCOPHAGE) 500 MG tablet Take 1 tablet (500 mg total) by mouth daily with breakfast.  90 tablet  1  . niacin (NIASPAN) 1000 MG CR tablet Take 2 tablets (2,000 mg total) by mouth at bedtime.  180 tablet  1  . nitroGLYCERIN (NITROSTAT) 0.4 MG SL tablet Place 1 tablet (0.4 mg total) under the tongue every 5 (five) minutes as needed.  100 tablet  3  . potassium chloride (K-DUR,KLOR-CON) 10 MEQ tablet Take 1 tablet (10 mEq total) by mouth daily.  90 tablet  1  . ranitidine (ZANTAC) 300 MG tablet Take 1 tablet (300 mg total) by mouth at bedtime.  90 tablet  0  . rosuvastatin (CRESTOR) 20 MG tablet Take 1 tablet (20 mg total) by mouth daily.  90 tablet  0   No current facility-administered medications on file prior to visit.  No Known Allergies History   Social History  . Marital Status: Legally Separated    Spouse Name: N/A    Number of Children: N/A  . Years of Education: N/A   Occupational History  . Not on file.   Social History Main Topics  . Smoking status: Current Every Day Smoker -- 0.80 packs/day  . Smokeless tobacco: Never Used  . Alcohol Use: No  . Drug Use: No  . Sexual Activity: Not on file   Other Topics Concern  . Not on file   Social History Narrative  . No narrative on file   \   Review of Systems  All other systems reviewed and are negative.       Objective:   Physical Exam  Vitals reviewed. Constitutional: She is oriented to person, place, and time. She appears well-developed and well-nourished. No distress.  Eyes: Conjunctivae are normal. No scleral icterus.  Neck: Neck supple. No JVD present. No thyromegaly  present.  Cardiovascular: Normal rate, regular rhythm and normal heart sounds.  Exam reveals no gallop and no friction rub.   No murmur heard. Pulmonary/Chest: Effort normal. No respiratory distress. She has wheezes. She has no rales. She exhibits no tenderness.  Abdominal: Soft. Bowel sounds are normal. She exhibits no distension and no mass. There is no tenderness. There is no rebound and no guarding.  Musculoskeletal: She exhibits no edema.  Lymphadenopathy:    She has no cervical adenopathy.  Neurological: She is alert and oriented to person, place, and time. She has normal reflexes. No cranial nerve deficit. Coordination normal.  Skin: Skin is warm. No rash noted. She is not diaphoretic. No erythema. No pallor.          Assessment & Plan:  1. Type II or unspecified type diabetes mellitus without mention of complication, not stated as uncontrolled Check hemoglobin A1c. The goal A1c is less than 6.5. I recommended smoking cessation. I recommended an annual eye exam. Diabetic foot exam was performed today was significant for absent peripheral pulses in the dorsalis pedis bilaterally. - COMPLETE METABOLIC PANEL WITH GFR - Lipid panel - Hemoglobin A1c  2. Unspecified hypothyroidism Check TSH. - TSH  3. Need for prophylactic vaccination and inoculation against influenza - Flu Vaccine QUAD 36+ mos IM  4. Atherosclerosis of native arteries of the extremities with intermittent claudication Given the abnormal foot exam, we'll schedule the patient for lower extremity arterial Dopplers.  I recommended she continue aspirin and also recommended smoking cessation - Lower Extremity Arterial Doppler Bilateral; Future  5. HTN (hypertension) Blood pressure is well controlled, continue present medications at the same dose  6. HLD (hyperlipidemia) Check fasting lipid panel. Goal LDL is less than 70.

## 2013-03-02 ENCOUNTER — Ambulatory Visit (HOSPITAL_COMMUNITY): Payer: Medicare Other | Attending: Family Medicine

## 2013-03-02 DIAGNOSIS — E785 Hyperlipidemia, unspecified: Secondary | ICD-10-CM | POA: Insufficient documentation

## 2013-03-02 DIAGNOSIS — I2510 Atherosclerotic heart disease of native coronary artery without angina pectoris: Secondary | ICD-10-CM | POA: Insufficient documentation

## 2013-03-02 DIAGNOSIS — I70219 Atherosclerosis of native arteries of extremities with intermittent claudication, unspecified extremity: Secondary | ICD-10-CM

## 2013-03-02 DIAGNOSIS — I739 Peripheral vascular disease, unspecified: Secondary | ICD-10-CM

## 2013-03-02 DIAGNOSIS — I700 Atherosclerosis of aorta: Secondary | ICD-10-CM

## 2013-03-02 DIAGNOSIS — E119 Type 2 diabetes mellitus without complications: Secondary | ICD-10-CM | POA: Insufficient documentation

## 2013-03-02 DIAGNOSIS — I10 Essential (primary) hypertension: Secondary | ICD-10-CM | POA: Insufficient documentation

## 2013-03-02 DIAGNOSIS — F17200 Nicotine dependence, unspecified, uncomplicated: Secondary | ICD-10-CM | POA: Insufficient documentation

## 2013-03-06 ENCOUNTER — Telehealth: Payer: Self-pay | Admitting: Family Medicine

## 2013-03-06 ENCOUNTER — Other Ambulatory Visit: Payer: Self-pay | Admitting: *Deleted

## 2013-03-06 MED ORDER — RANITIDINE HCL 300 MG PO TABS: 300.0000 mg | ORAL_TABLET | Freq: Every day | ORAL | Status: DC

## 2013-03-06 MED ORDER — LOSARTAN POTASSIUM 50 MG PO TABS: 50.0000 mg | ORAL_TABLET | Freq: Every day | ORAL | Status: DC

## 2013-03-06 NOTE — Telephone Encounter (Signed)
Losartan 50 mg #90 

## 2013-03-06 NOTE — Telephone Encounter (Signed)
Medication refilled per protocol. 

## 2013-03-09 ENCOUNTER — Other Ambulatory Visit: Payer: Self-pay | Admitting: Family Medicine

## 2013-03-09 DIAGNOSIS — I70219 Atherosclerosis of native arteries of extremities with intermittent claudication, unspecified extremity: Secondary | ICD-10-CM

## 2013-03-09 NOTE — Progress Notes (Signed)
.  Patient aware and will make referral

## 2013-03-26 ENCOUNTER — Ambulatory Visit (INDEPENDENT_AMBULATORY_CARE_PROVIDER_SITE_OTHER): Payer: Medicare Other | Admitting: Cardiovascular Disease

## 2013-03-26 ENCOUNTER — Encounter: Payer: Self-pay | Admitting: Cardiovascular Disease

## 2013-03-26 VITALS — BP 140/86 | HR 63 | Ht 66.0 in | Wt 240.2 lb

## 2013-03-26 DIAGNOSIS — I739 Peripheral vascular disease, unspecified: Secondary | ICD-10-CM

## 2013-03-26 DIAGNOSIS — I10 Essential (primary) hypertension: Secondary | ICD-10-CM | POA: Insufficient documentation

## 2013-03-26 DIAGNOSIS — E785 Hyperlipidemia, unspecified: Secondary | ICD-10-CM

## 2013-03-26 DIAGNOSIS — R0989 Other specified symptoms and signs involving the circulatory and respiratory systems: Secondary | ICD-10-CM

## 2013-03-26 DIAGNOSIS — I2510 Atherosclerotic heart disease of native coronary artery without angina pectoris: Secondary | ICD-10-CM

## 2013-03-26 NOTE — Assessment & Plan Note (Signed)
Apparently she had carotid Dopplers performed several years ago that showed mild atherosclerotic changes of the left internal carotid artery. She is neurologically asymptomatic. We will get carotid Dopplers on her in 6 months

## 2013-03-26 NOTE — Assessment & Plan Note (Signed)
Well-controlled on current medications 

## 2013-03-26 NOTE — Progress Notes (Signed)
03/26/2013 [name]   11-30-1960  [id number]  Primary Physician Leo Grosser, MD Primary Cardiologist: Runell Gess MD Roseanne Reno   HPI:  Ms. Redditt is a 52 year old severely overweight married Caucasian female with no children he was out on disability for multiple reasons. She was referred by Dr. Tanya Nones  for peripheral vasodilation because of lifestyle limiting claudication. Her cardiac risk factors include continued tobacco abuse one half pack per day she smoked more than that for 35 years. She has treated hypertension, hypokalemia and diabetes. Her father died of a massive myocardial infarction at age 61 and her mother died of sudden cardiac death. She has had a cardiac catheterization performed by Dr. Bonnee Quin 4/ 22/09 revealing diffuse CAD with an occluded RCA, a left-to-right collaterals and calcified diffusely diseased LAD and circumflex coronary arteries with an EF of 25-30%. She does complain of life the limiting claudication and recent Dopplers of her large cerebriform 03/02/13 revealing left greater than right SFA stenosis.   Current Outpatient Prescriptions  Medication Sig Dispense Refill  . aspirin 325 MG tablet Take 325 mg by mouth daily.        . carvedilol (COREG) 25 MG tablet Take 1 tablet (25 mg total) by mouth 2 (two) times daily with a meal.  180 tablet  3  . cyclobenzaprine (FLEXERIL) 5 MG tablet Take 5 mg by mouth every 8 (eight) hours as needed.      . furosemide (LASIX) 20 MG tablet Take 1 tablet (20 mg total) by mouth daily.  90 tablet  2  . glipiZIDE (GLUCOTROL) 10 MG tablet Take 1 tablet (10 mg total) by mouth 2 (two) times daily before a meal.  180 tablet  1  . levothyroxine (SYNTHROID, LEVOTHROID) 175 MCG tablet Take 150 mcg by mouth daily.       Marland Kitchen losartan (COZAAR) 50 MG tablet Take 1 tablet (50 mg total) by mouth daily.  90 tablet  1  . metFORMIN (GLUCOPHAGE) 500 MG tablet Take 1 tablet (500 mg total) by mouth daily with  breakfast.  90 tablet  1  . niacin (NIASPAN) 1000 MG CR tablet Take 2 tablets (2,000 mg total) by mouth at bedtime.  180 tablet  1  . nitroGLYCERIN (NITROSTAT) 0.4 MG SL tablet Place 1 tablet (0.4 mg total) under the tongue every 5 (five) minutes as needed.  100 tablet  3  . potassium chloride (K-DUR,KLOR-CON) 10 MEQ tablet Take 1 tablet (10 mEq total) by mouth daily.  90 tablet  1  . ranitidine (ZANTAC) 300 MG tablet Take 1 tablet (300 mg total) by mouth at bedtime.  90 tablet  0  . rosuvastatin (CRESTOR) 20 MG tablet Take 1 tablet (20 mg total) by mouth daily.  90 tablet  0   No current facility-administered medications for this visit.    No Known Allergies  History   Social History  . Marital Status: Legally Separated    Spouse Name: N/A    Number of Children: N/A  . Years of Education: N/A   Occupational History  . Not on file.   Social History Main Topics  . Smoking status: Current Every Day Smoker -- 0.80 packs/day  . Smokeless tobacco: Never Used  . Alcohol Use: No  . Drug Use: No  . Sexual Activity: Not on file   Other Topics Concern  . Not on file   Social History Narrative  . No narrative on file     Review of  Systems: General: negative for chills, fever, night sweats or weight changes.  Cardiovascular: negative for chest pain, dyspnea on exertion, edema, orthopnea, palpitations, paroxysmal nocturnal dyspnea or shortness of breath Dermatological: negative for rash Respiratory: negative for cough or wheezing Urologic: negative for hematuria Abdominal: negative for nausea, vomiting, diarrhea, bright red blood per rectum, melena, or hematemesis Neurologic: negative for visual changes, syncope, or dizziness All other systems reviewed and are otherwise negative except as noted above.    Blood pressure 140/86, pulse 63, height 5\' 6"  (1.676 m), weight 240 lb 3.2 oz (108.954 kg).  General appearance: alert and no distress Neck: no adenopathy, no JVD, supple,  symmetrical, trachea midline, thyroid not enlarged, symmetric, no tenderness/mass/nodules and soft left carotid bruit Lungs: clear to auscultation bilaterally Heart: regular rate and rhythm, S1, S2 normal, no murmur, click, rub or gallop Abdomen: soft, non-tender; bowel sounds normal; no masses,  no organomegaly and protuberant Extremities: extremities normal, atraumatic, no cyanosis or edema Pulses: 2+ and symmetric  EKG not performed today  ASSESSMENT AND PLAN:   Peripheral arterial disease Patient was referred for claudication. She had lowered from a Doppler studies performed by with our heart care 03/02/13 revealing a right ABI of 0.8 to with moderate SFA disease and a left ABI of 0.81 with high-grade distal left SFA disease. She has symmetric lifestyle limiting claudication. She will need angiography and potential intervention which is currently considering. I will get lower extremity arterial Dopplers on her semiannually.  CAD, NATIVE VESSEL Patient had cardiac catheterization performed at that time [facility] 4/22/9 revealing an occluded RCA with moderately severe and diffusely calcified LAD and circumflex disease with an EF of 25-30% probably related to hypertension and discontinuation of her medication/ poor medication  compliance. Patient denies chest pain but does get some dyspnea on exertion.  Left carotid bruit Apparently she had carotid Dopplers performed several years ago that showed mild atherosclerotic changes of the left internal carotid artery. She is neurologically asymptomatic. We will get carotid Dopplers on her in 6 months  HYPERLIPIDEMIA-MIXED On statin therapy with recent lipid profile performed 02/20/13 revealing a glucose of 118, LDL 51 HDL of 33  Essential hypertension Well-controlled on current medications      Runell Gess MD [facility], [facility] 03/26/2013 11:11 AM

## 2013-03-26 NOTE — Assessment & Plan Note (Signed)
On statin therapy with recent lipid profile performed 02/20/13 revealing a glucose of 118, LDL 51 HDL of 33

## 2013-03-26 NOTE — Patient Instructions (Signed)
  We will see you back in follow up in 6 months with Dr Allyson Sabal  Dr Allyson Sabal has ordered carotid dopplers and lower extremity arterial dopplers to be done in 6 months.

## 2013-03-26 NOTE — Assessment & Plan Note (Signed)
Patient had cardiac catheterization performed at that time [facility] 4/22/9 revealing an occluded RCA with moderately severe and diffusely calcified LAD and circumflex disease with an EF of 25-30% probably related to hypertension and discontinuation of her medication/ poor medication  compliance. Patient denies chest pain but does get some dyspnea on exertion.

## 2013-03-26 NOTE — Assessment & Plan Note (Signed)
Patient was referred for claudication. She had lowered from a Doppler studies performed by with our heart care 03/02/13 revealing a right ABI of 0.8 to with moderate SFA disease and a left ABI of 0.81 with high-grade distal left SFA disease. She has symmetric lifestyle limiting claudication. She will need angiography and potential intervention which is currently considering. I will get lower extremity arterial Dopplers on her semiannually.

## 2013-03-27 ENCOUNTER — Encounter: Payer: Self-pay | Admitting: Cardiovascular Disease

## 2013-04-22 ENCOUNTER — Other Ambulatory Visit: Payer: Self-pay | Admitting: Family Medicine

## 2013-04-22 MED ORDER — LEVOTHYROXINE SODIUM 150 MCG PO TABS: 150.0000 ug | ORAL_TABLET | Freq: Every day | ORAL | Status: DC

## 2013-04-22 MED ORDER — NIACIN ER (ANTIHYPERLIPIDEMIC) 1000 MG PO TBCR: 2000.0000 mg | EXTENDED_RELEASE_TABLET | Freq: Every day | ORAL | Status: DC

## 2013-04-22 MED ORDER — GLIPIZIDE 10 MG PO TABS: 10.0000 mg | ORAL_TABLET | Freq: Two times a day (BID) | ORAL | Status: DC

## 2013-04-22 NOTE — Telephone Encounter (Signed)
Rx Refilled  

## 2013-05-22 ENCOUNTER — Other Ambulatory Visit: Payer: Self-pay

## 2013-05-22 ENCOUNTER — Other Ambulatory Visit: Payer: Self-pay | Admitting: Family Medicine

## 2013-05-22 ENCOUNTER — Telehealth: Payer: Self-pay | Admitting: Cardiology

## 2013-05-22 MED ORDER — METFORMIN HCL 500 MG PO TABS: 500.0000 mg | ORAL_TABLET | Freq: Every day | ORAL | Status: DC

## 2013-05-22 MED ORDER — RANITIDINE HCL 300 MG PO TABS: 300.0000 mg | ORAL_TABLET | Freq: Every day | ORAL | Status: DC

## 2013-05-22 MED ORDER — POTASSIUM CHLORIDE CRYS ER 10 MEQ PO TBCR: 10.0000 meq | EXTENDED_RELEASE_TABLET | Freq: Every day | ORAL | Status: DC

## 2013-05-22 MED ORDER — FUROSEMIDE 20 MG PO TABS: 20.0000 mg | ORAL_TABLET | Freq: Every day | ORAL | Status: DC

## 2013-05-22 NOTE — Telephone Encounter (Signed)
Rx Refilled  

## 2013-05-22 NOTE — Telephone Encounter (Signed)
Received fax refill request  Rx #  Medication:  Ranitidine HCL 300 mg tablet Qty 90 Sig:  Take one by mouth at bedtime Physician:  Wyline Mood

## 2013-07-30 ENCOUNTER — Ambulatory Visit (INDEPENDENT_AMBULATORY_CARE_PROVIDER_SITE_OTHER): Payer: Commercial Managed Care - HMO | Admitting: Family Medicine

## 2013-07-30 ENCOUNTER — Encounter: Payer: Self-pay | Admitting: Family Medicine

## 2013-07-30 VITALS — BP 120/88 | HR 76 | Temp 97.1°F | Resp 18 | Ht 66.0 in | Wt 232.0 lb

## 2013-07-30 DIAGNOSIS — E785 Hyperlipidemia, unspecified: Secondary | ICD-10-CM

## 2013-07-30 DIAGNOSIS — E039 Hypothyroidism, unspecified: Secondary | ICD-10-CM

## 2013-07-30 DIAGNOSIS — I10 Essential (primary) hypertension: Secondary | ICD-10-CM

## 2013-07-30 DIAGNOSIS — E119 Type 2 diabetes mellitus without complications: Secondary | ICD-10-CM

## 2013-07-30 LAB — COMPLETE METABOLIC PANEL WITH GFR
ALBUMIN: 4.1 g/dL (ref 3.5–5.2)
ALK PHOS: 75 U/L (ref 39–117)
ALT: 13 U/L (ref 0–35)
AST: 17 U/L (ref 0–37)
BILIRUBIN TOTAL: 0.5 mg/dL (ref 0.2–1.2)
BUN: 15 mg/dL (ref 6–23)
CO2: 29 meq/L (ref 19–32)
Calcium: 9.6 mg/dL (ref 8.4–10.5)
Chloride: 103 mEq/L (ref 96–112)
Creat: 0.77 mg/dL (ref 0.50–1.10)
GFR, EST NON AFRICAN AMERICAN: 89 mL/min
GLUCOSE: 122 mg/dL — AB (ref 70–99)
POTASSIUM: 4.6 meq/L (ref 3.5–5.3)
SODIUM: 139 meq/L (ref 135–145)
Total Protein: 6.9 g/dL (ref 6.0–8.3)

## 2013-07-30 LAB — HEMOGLOBIN A1C
Hgb A1c MFr Bld: 5.9 % — ABNORMAL HIGH (ref <5.7)
Mean Plasma Glucose: 123 mg/dL — ABNORMAL HIGH (ref <117)

## 2013-07-30 LAB — LIPID PANEL
CHOLESTEROL: 118 mg/dL (ref 0–200)
HDL: 38 mg/dL — ABNORMAL LOW (ref >39)
LDL Cholesterol: 61 mg/dL (ref 0–99)
Total CHOL/HDL Ratio: 3.1 Ratio
Triglycerides: 95 mg/dL (ref <150)
VLDL: 19 mg/dL (ref 0–40)

## 2013-07-30 LAB — TSH: TSH: 0.921 u[IU]/mL (ref 0.350–4.500)

## 2013-07-30 MED ORDER — MOMETASONE FUROATE 0.1 % EX SOLN: Freq: Every day | CUTANEOUS | Status: DC

## 2013-07-30 NOTE — Progress Notes (Signed)
Subjective:    Patient ID: [name], female    DOB: [birth year], 53 y.o.   MRN: [id number]  HPI  Patient is a very sweet 53 year old white female who presents today for her chronic medical conditions. She is a history of coronary artery disease and peripheral artery disease. Unfortunately she continues to smoke one half pack per day to one pack per day. She is in the pre-contemplative phase. She's not ready to quit smoking yet. She also has a history of hypertension, hyperlipidemia, diabetes mellitus type 2, and hypothyroidism. She is overdue for lab work. I last checked her labs in September. At that time her hemoglobin A1c was 6.1 and a fasting lipid panel and TSH within normal limits. Today her blood pressure is excellent at 120/88. She states her fasting blood sugars are ranging 06/20/1928. She denies any polyuria polydipsia or blurred vision. Weight is down 4 pounds from her last office visit. She's not engaging in regular exercise due to knee pain. She is taking an aspirin. Past Medical History  Diagnosis Date  . Hypertension   . Diabetes mellitus   . Arthritis     knees  . Blood transfusion 08/2007  . CHF (congestive heart failure)   . GERD (gastroesophageal reflux disease)   . Hyperlipidemia   . Hypothyroidism   . Coronary artery disease   . Claudication   . Tobacco abuse    Current Outpatient Prescriptions on File Prior to Visit  Medication Sig Dispense Refill  . aspirin 325 MG tablet Take 325 mg by mouth daily.        . carvedilol (COREG) 25 MG tablet Take 1 tablet (25 mg total) by mouth 2 (two) times daily with a meal.  180 tablet  3  . cyclobenzaprine (FLEXERIL) 5 MG tablet Take 5 mg by mouth every 8 (eight) hours as needed.      . furosemide (LASIX) 20 MG tablet Take 1 tablet (20 mg total) by mouth daily.  90 tablet  1  . glipiZIDE (GLUCOTROL) 10 MG tablet Take 1 tablet (10 mg total) by mouth 2 (two) times daily before a meal.  180 tablet  2  . levothyroxine  (SYNTHROID, LEVOTHROID) 150 MCG tablet Take 1 tablet (150 mcg total) by mouth daily before breakfast.  90 tablet  2  . losartan (COZAAR) 50 MG tablet Take 1 tablet (50 mg total) by mouth daily.  90 tablet  1  . metFORMIN (GLUCOPHAGE) 500 MG tablet Take 1 tablet (500 mg total) by mouth daily with breakfast.  90 tablet  1  . niacin (NIASPAN) 1000 MG CR tablet Take 2 tablets (2,000 mg total) by mouth at bedtime.  180 tablet  2  . nitroGLYCERIN (NITROSTAT) 0.4 MG SL tablet Place 1 tablet (0.4 mg total) under the tongue every 5 (five) minutes as needed.  100 tablet  3  . potassium chloride (K-DUR,KLOR-CON) 10 MEQ tablet Take 1 tablet (10 mEq total) by mouth daily.  90 tablet  1  . ranitidine (ZANTAC) 300 MG tablet Take 1 tablet (300 mg total) by mouth at bedtime.  90 tablet  3  . rosuvastatin (CRESTOR) 20 MG tablet Take 1 tablet (20 mg total) by mouth daily.  90 tablet  0   No current facility-administered medications on file prior to visit.   No past surgical history on file. No Known Allergies History   Social History  . Marital Status: Legally Separated    Spouse Name: N/A    Number of  Children: N/A  . Years of Education: N/A   Occupational History  . Not on file.   Social History Main Topics  . Smoking status: Current Every Day Smoker -- 0.80 packs/day  . Smokeless tobacco: Never Used  . Alcohol Use: No  . Drug Use: No  . Sexual Activity: Not on file   Other Topics Concern  . Not on file   Social History Narrative  . No narrative on file   Family History  Problem Relation Age of Onset  . Heart disease Mother   . Stroke Mother   . Heart disease Father   . Heart disease Maternal Grandmother   . Stroke Maternal Grandmother   . Heart disease Maternal Grandfather   . Kidney disease Paternal Grandmother   . Heart disease Paternal Grandfather      Review of Systems  All other systems reviewed and are negative.       Objective:   Physical Exam  Vitals  reviewed. Constitutional: She is oriented to person, place, and time. She appears well-developed and well-nourished. No distress.  HENT:  Mouth/Throat: Oropharynx is clear and moist.  Neck: Neck supple. No JVD present. No thyromegaly present.  Cardiovascular: Normal rate, regular rhythm and normal heart sounds.   No murmur heard. Pulmonary/Chest: Effort normal and breath sounds normal. No respiratory distress. She has no wheezes. She has no rales.  Abdominal: Soft. Bowel sounds are normal. She exhibits no distension and no mass. There is no tenderness. There is no rebound and no guarding.  Lymphadenopathy:    She has no cervical adenopathy.  Neurological: She is alert and oriented to person, place, and time. She has normal reflexes. She displays normal reflexes. No cranial nerve deficit. She exhibits normal muscle tone. Coordination normal.  Skin: Skin is warm. No rash noted. She is not diaphoretic. No erythema. No pallor.          Assessment & Plan:  1. Unspecified hypothyroidism I will recheck the patient's TSH. - TSH  2. Type II or unspecified type diabetes mellitus without mention of complication, not stated as uncontrolled Check the patient's hemoglobin A1c. Her goal hemoglobin A1c is less than 6.5. I also discussed switching the patient from glipizide to invokana to try to help her lose weight. She will check on the price.  I recommended smoking cessation, diet, exercise, and 30 pounds weight loss - COMPLETE METABOLIC PANEL WITH GFR - Lipid panel - Hemoglobin A1c  3. HTN (hypertension) Blood pressure is excellent. Again I recommended smoking cessation but the patient is in the pre-contemplative phase.  4. HLD (hyperlipidemia) Check fasting lipid panel. Total LDL is less than 70 given her history of coronary artery disease.

## 2013-08-28 ENCOUNTER — Other Ambulatory Visit: Payer: Self-pay | Admitting: *Deleted

## 2013-08-28 MED ORDER — CARVEDILOL 25 MG PO TABS: 25.0000 mg | ORAL_TABLET | Freq: Two times a day (BID) | ORAL | Status: DC

## 2013-08-28 MED ORDER — RANITIDINE HCL 300 MG PO TABS: 300.0000 mg | ORAL_TABLET | Freq: Every day | ORAL | Status: DC

## 2013-08-28 MED ORDER — FUROSEMIDE 20 MG PO TABS: 20.0000 mg | ORAL_TABLET | Freq: Every day | ORAL | Status: DC

## 2013-08-28 NOTE — Telephone Encounter (Signed)
3 Rx refills sent to patients pharmacy.  

## 2013-08-29 ENCOUNTER — Other Ambulatory Visit: Payer: Self-pay | Admitting: Family Medicine

## 2013-08-29 MED ORDER — LOSARTAN POTASSIUM 50 MG PO TABS: 50.0000 mg | ORAL_TABLET | Freq: Every day | ORAL | Status: DC

## 2013-08-29 MED ORDER — GLIPIZIDE 10 MG PO TABS: 10.0000 mg | ORAL_TABLET | Freq: Two times a day (BID) | ORAL | Status: DC

## 2013-08-29 MED ORDER — ACCU-CHEK AVIVA VI SOLN: Status: DC

## 2013-08-29 MED ORDER — BD SWAB SINGLE USE REGULAR PADS: MEDICATED_PAD | Status: DC

## 2013-08-29 MED ORDER — METFORMIN HCL 500 MG PO TABS: 500.0000 mg | ORAL_TABLET | Freq: Every day | ORAL | Status: DC

## 2013-08-29 MED ORDER — ACCU-CHEK SOFT TOUCH LANCETS MISC: Status: DC

## 2013-08-29 MED ORDER — NIACIN ER (ANTIHYPERLIPIDEMIC) 1000 MG PO TBCR: 2000.0000 mg | EXTENDED_RELEASE_TABLET | Freq: Every day | ORAL | Status: DC

## 2013-08-29 MED ORDER — ACCU-CHEK AVIVA PLUS W/DEVICE KIT: PACK | Status: DC

## 2013-08-29 MED ORDER — ROSUVASTATIN CALCIUM 20 MG PO TABS: 20.0000 mg | ORAL_TABLET | Freq: Every day | ORAL | Status: DC

## 2013-08-29 MED ORDER — NITROGLYCERIN 0.4 MG SL SUBL: 0.4000 mg | SUBLINGUAL_TABLET | SUBLINGUAL | Status: DC | PRN

## 2013-08-29 MED ORDER — POTASSIUM CHLORIDE CRYS ER 10 MEQ PO TBCR: 10.0000 meq | EXTENDED_RELEASE_TABLET | Freq: Every day | ORAL | Status: DC

## 2013-08-29 MED ORDER — GLUCOSE BLOOD VI STRP: ORAL_STRIP | Status: DC

## 2013-08-29 MED ORDER — LEVOTHYROXINE SODIUM 150 MCG PO TABS: 150.0000 ug | ORAL_TABLET | Freq: Every day | ORAL | Status: DC

## 2013-08-29 NOTE — Telephone Encounter (Signed)
Rx Refilled and DM supplies refilled

## 2013-09-25 ENCOUNTER — Encounter: Payer: Self-pay | Admitting: Cardiovascular Disease

## 2013-09-25 ENCOUNTER — Ambulatory Visit (INDEPENDENT_AMBULATORY_CARE_PROVIDER_SITE_OTHER): Payer: Medicare HMO | Admitting: Cardiovascular Disease

## 2013-09-25 VITALS — BP 158/90 | HR 65 | Ht 66.0 in | Wt 234.8 lb

## 2013-09-25 DIAGNOSIS — I519 Heart disease, unspecified: Secondary | ICD-10-CM

## 2013-09-25 DIAGNOSIS — I2510 Atherosclerotic heart disease of native coronary artery without angina pectoris: Secondary | ICD-10-CM

## 2013-09-25 DIAGNOSIS — E785 Hyperlipidemia, unspecified: Secondary | ICD-10-CM

## 2013-09-25 DIAGNOSIS — B351 Tinea unguium: Secondary | ICD-10-CM

## 2013-09-25 DIAGNOSIS — I739 Peripheral vascular disease, unspecified: Secondary | ICD-10-CM

## 2013-09-25 DIAGNOSIS — I10 Essential (primary) hypertension: Secondary | ICD-10-CM

## 2013-09-25 NOTE — Assessment & Plan Note (Signed)
She has known CAD documented by cardiac catheterization performed by Dr. Riley KillStuckey in 2009 revealing diffuse CAD with an occluded RCA, left to right collaterals and calcified diffusely diseased LAD and circumflex coronary arteries. Her EF at that time was 25-30%. She has not had an evaluation for her ejection fraction and left several years. I going to get a 2-D echo and have Dr. Christiane HaJonathan branch review and discuss ICD therapy for primary intervention if her ejection fraction is less than 35%.

## 2013-09-25 NOTE — Assessment & Plan Note (Signed)
[name] had Dopplers performed in October of last year that revealed a right ABI of 0.92 with moderate SFA disease and a left ABI of 21 with a high-grade lesion in the distal left SFA. She really denies claudication. At this time, we will get Dopplers of her annual basis I will see her back

## 2013-09-25 NOTE — Patient Instructions (Signed)
  We will see you back in follow up in : 1 year with Dr Allyson SabalBerry   Dr Allyson SabalBerry has ordered : 1.  Echocardiogram. Echocardiography is a painless test that uses sound waves to create images of your heart. It provides your doctor with information about the size and shape of your heart and how well your heart's chambers and valves are working. This procedure takes approximately one hour. There are no restrictions for this procedure.   2. Lower extremity arterial doppler in October 2015- During this test, ultrasound is used to evaluate arterial blood flow in the legs. Allow approximately one hour for this exam.    3. Dr Allyson SabalBerry has referred you to Dr Wyline MoodBranch for your routine cardiac care.  You will need to see him in 2-3 months.

## 2013-09-25 NOTE — Assessment & Plan Note (Signed)
On statin therapy with her most recent lipid profile performed 07/30/13 revealing a total cholesterol of 118, LDL 61 HDL of 38

## 2013-09-25 NOTE — Assessment & Plan Note (Signed)
Well-controlled on current medications 

## 2013-09-25 NOTE — Progress Notes (Signed)
09/25/2013 [name]   February 07, 1961  [id number]  Primary Physician [name] Fraction, MD Primary Cardiologist: [name] Harp MD [name]   HPI:  [name] is a 53 year old severely overweight married Caucasian female with no children he was out on disability for multiple reasons. She was referred by [name] for peripheral vasodilation because of lifestyle limiting claudication. Her cardiac risk factors include continued tobacco abuse one half pack per day she smoked more than that for 35 years. She has treated hypertension, hypokalemia and diabetes. Her father died of a massive myocardial infarction at age 23 and her mother died of sudden cardiac death. She has had a cardiac catheterization performed by Dr. Addison [name] 4/ 22/09 revealing diffuse CAD with an occluded RCA, a left-to-right collaterals and calcified diffusely diseased LAD and circumflex coronary arteries with an EF of 25-30%.  I saw her in the office in 6 months ago. She was admitted for evaluation of peripheral arterial disease. When questioned today she really denies claudication. She also denies chest pain or shortness of breath.   Current Outpatient Prescriptions  Medication Sig Dispense Refill  . Alcohol Swabs (B-D SINGLE USE SWABS REGULAR) PADS Use as directed  100 each  3  . aspirin 325 MG tablet Take 325 mg by mouth daily.        . Blood Glucose Calibration (ACCU-CHEK AVIVA) SOLN Use as directed  3 each  3  . Blood Glucose Monitoring Suppl (ACCU-CHEK AVIVA PLUS) W/DEVICE KIT Use as directed  1 kit  0  . carvedilol (COREG) 25 MG tablet Take 1 tablet (25 mg total) by mouth 2 (two) times daily with a meal.  180 tablet  1  . cyclobenzaprine (FLEXERIL) 5 MG tablet Take 5 mg by mouth every 8 (eight) hours as needed.      . furosemide (LASIX) 20 MG tablet Take 1 tablet (20 mg total) by mouth daily.  90 tablet  1  . glipiZIDE (GLUCOTROL) 10 MG tablet Take 1 tablet (10 mg total) by mouth 2 (two) times  daily before a meal.  180 tablet  3  . glucose blood (ACCU-CHEK AVIVA PLUS) test strip Use as instructed  100 each  3  . Lancets (ACCU-CHEK SOFT TOUCH) lancets Use as instructed  100 each  3  . levothyroxine (SYNTHROID, LEVOTHROID) 150 MCG tablet Take 1 tablet (150 mcg total) by mouth daily before breakfast.  90 tablet  3  . losartan (COZAAR) 50 MG tablet Take 1 tablet (50 mg total) by mouth daily.  90 tablet  3  . metFORMIN (GLUCOPHAGE) 500 MG tablet Take 1 tablet (500 mg total) by mouth daily with breakfast.  90 tablet  3  . mometasone (ELOCON) 0.1 % lotion Apply topically daily.  60 mL  0  . niacin (NIASPAN) 1000 MG CR tablet Take 2 tablets (2,000 mg total) by mouth at bedtime.  180 tablet  3  . nitroGLYCERIN (NITROSTAT) 0.4 MG SL tablet Place 1 tablet (0.4 mg total) under the tongue every 5 (five) minutes as needed.  100 tablet  3  . potassium chloride (K-DUR,KLOR-CON) 10 MEQ tablet Take 1 tablet (10 mEq total) by mouth daily.  90 tablet  3  . ranitidine (ZANTAC) 300 MG tablet Take 1 tablet (300 mg total) by mouth at bedtime.  90 tablet  0  . rosuvastatin (CRESTOR) 20 MG tablet Take 1 tablet (20 mg total) by mouth daily.  90 tablet  3   No current facility-administered  medications for this visit.    No Known Allergies  History   Social History  . Marital Status: Legally Separated    Spouse Name: N/A    Number of Children: N/A  . Years of Education: N/A   Occupational History  . Not on file.   Social History Main Topics  . Smoking status: Current Every Day Smoker -- 0.50 packs/day for 32 years    Types: Cigarettes  . Smokeless tobacco: Never Used  . Alcohol Use: No  . Drug Use: No  . Sexual Activity: Not on file   Other Topics Concern  . Not on file   Social History Narrative  . No narrative on file     Review of Systems: General: negative for chills, fever, night sweats or weight changes.  Cardiovascular: negative for chest pain, dyspnea on exertion, edema,  orthopnea, palpitations, paroxysmal nocturnal dyspnea or shortness of breath Dermatological: negative for rash Respiratory: negative for cough or wheezing Urologic: negative for hematuria Abdominal: negative for nausea, vomiting, diarrhea, bright red blood per rectum, melena, or hematemesis Neurologic: negative for visual changes, syncope, or dizziness All other systems reviewed and are otherwise negative except as noted above.    Blood pressure 158/90, pulse 65, height '5\' 6"'  (1.676 m), weight 234 lb 12.8 oz (106.505 kg).  General appearance: alert and no distress Neck: no adenopathy, no carotid bruit, no JVD, supple, symmetrical, trachea midline and thyroid not enlarged, symmetric, no tenderness/mass/nodules Lungs: clear to auscultation bilaterally Heart: regular rate and rhythm, S1, S2 normal, no murmur, click, rub or gallop Extremities: extremities normal, atraumatic, no cyanosis or edema  EKG normal sinus rhythm at 65 without ST or T wave changes  ASSESSMENT AND PLAN:   Peripheral arterial disease [name] had Dopplers performed in October of last year that revealed a right ABI of 0.92 with moderate SFA disease and a left ABI of 21 with a high-grade lesion in the distal left SFA. She really denies claudication. At this time, we will get Dopplers of her annual basis I will see her back  HYPERTENSION Well-controlled on current medications  HYPERLIPIDEMIA-MIXED On statin therapy with her most recent lipid profile performed 07/30/13 revealing a total cholesterol of 118, LDL 61 HDL of [place] MD FACP,FACC,FAHA, [facility] 09/25/2013 11:00 AM

## 2013-10-12 ENCOUNTER — Telehealth: Payer: Self-pay | Admitting: *Deleted

## 2013-10-12 NOTE — Telephone Encounter (Signed)
Faxed [employer] [facility], pending auth.

## 2013-10-16 NOTE — Telephone Encounter (Signed)
Faxed [employer] for status on referral, pending authorization.       

## 2013-10-17 NOTE — Telephone Encounter (Signed)
[facility] authorization number [id number] to Dr Nanetta BattyJonathan Berry MD, form fax to that facility.

## 2013-10-29 ENCOUNTER — Other Ambulatory Visit: Payer: Self-pay | Admitting: Family Medicine

## 2013-10-31 ENCOUNTER — Ambulatory Visit (HOSPITAL_COMMUNITY)
Admission: RE | Admit: 2013-10-31 | Discharge: 2013-10-31 | Disposition: A | Discharge status: Home / Self Care | Payer: Medicare HMO | Source: Ambulatory Visit | Attending: Cardiovascular Disease | Admitting: Cardiovascular Disease

## 2013-10-31 DIAGNOSIS — I517 Cardiomegaly: Secondary | ICD-10-CM

## 2013-10-31 DIAGNOSIS — I519 Heart disease, unspecified: Secondary | ICD-10-CM

## 2013-10-31 DIAGNOSIS — I509 Heart failure, unspecified: Secondary | ICD-10-CM | POA: Insufficient documentation

## 2013-10-31 NOTE — Progress Notes (Signed)
2D Echocardiogram Complete.  10/31/2013   Akhil Piscopo, RDCS  

## 2013-11-06 ENCOUNTER — Encounter: Payer: Self-pay | Admitting: *Deleted

## 2013-12-04 ENCOUNTER — Encounter: Payer: Self-pay | Admitting: Cardiology

## 2013-12-06 ENCOUNTER — Other Ambulatory Visit: Payer: Self-pay | Admitting: Cardiovascular Disease

## 2013-12-06 NOTE — Telephone Encounter (Signed)
Refilled refused - defer to PCP

## 2013-12-08 ENCOUNTER — Other Ambulatory Visit: Payer: Self-pay | Admitting: *Deleted

## 2013-12-08 DIAGNOSIS — E785 Hyperlipidemia, unspecified: Secondary | ICD-10-CM

## 2013-12-08 DIAGNOSIS — E119 Type 2 diabetes mellitus without complications: Secondary | ICD-10-CM

## 2013-12-08 DIAGNOSIS — I10 Essential (primary) hypertension: Secondary | ICD-10-CM

## 2013-12-08 DIAGNOSIS — E039 Hypothyroidism, unspecified: Secondary | ICD-10-CM

## 2013-12-14 ENCOUNTER — Encounter: Payer: Self-pay | Admitting: Family Medicine

## 2013-12-14 ENCOUNTER — Ambulatory Visit (INDEPENDENT_AMBULATORY_CARE_PROVIDER_SITE_OTHER): Payer: Commercial Managed Care - HMO | Admitting: Family Medicine

## 2013-12-14 VITALS — BP 126/70 | HR 64 | Temp 97.9°F | Resp 20 | Ht 66.0 in | Wt 233.0 lb

## 2013-12-14 DIAGNOSIS — E039 Hypothyroidism, unspecified: Secondary | ICD-10-CM

## 2013-12-14 DIAGNOSIS — I10 Essential (primary) hypertension: Secondary | ICD-10-CM

## 2013-12-14 DIAGNOSIS — E785 Hyperlipidemia, unspecified: Secondary | ICD-10-CM

## 2013-12-14 DIAGNOSIS — E119 Type 2 diabetes mellitus without complications: Secondary | ICD-10-CM

## 2013-12-14 LAB — COMPLETE METABOLIC PANEL WITH GFR
ALT: 15 U/L (ref 0–35)
AST: 17 U/L (ref 0–37)
Albumin: 3.9 g/dL (ref 3.5–5.2)
Alkaline Phosphatase: 75 U/L (ref 39–117)
BUN: 13 mg/dL (ref 6–23)
CALCIUM: 9.1 mg/dL (ref 8.4–10.5)
CHLORIDE: 103 meq/L (ref 96–112)
CO2: 26 mEq/L (ref 19–32)
Creat: 0.71 mg/dL (ref 0.50–1.10)
GFR, Est African American: >89 mL/min
GFR, Est Non African American: >89 mL/min
Glucose, Bld: 107 mg/dL — ABNORMAL HIGH (ref 70–99)
Potassium: 4.4 mEq/L (ref 3.5–5.3)
SODIUM: 137 meq/L (ref 135–145)
TOTAL PROTEIN: 6.6 g/dL (ref 6.0–8.3)
Total Bilirubin: 0.3 mg/dL (ref 0.2–1.2)

## 2013-12-14 LAB — LIPID PANEL
Cholesterol: 111 mg/dL (ref 0–200)
HDL: 36 mg/dL — AB (ref >39)
LDL CALC: 58 mg/dL (ref 0–99)
Total CHOL/HDL Ratio: 3.1 Ratio
Triglycerides: 87 mg/dL (ref <150)
VLDL: 17 mg/dL (ref 0–40)

## 2013-12-14 LAB — HEMOGLOBIN A1C
HEMOGLOBIN A1C: 6 % — AB (ref <5.7)
Mean Plasma Glucose: 126 mg/dL — ABNORMAL HIGH (ref <117)

## 2013-12-14 LAB — TSH: TSH: 0.722 u[IU]/mL (ref 0.350–4.500)

## 2013-12-14 MED ORDER — BD SWAB SINGLE USE REGULAR PADS: MEDICATED_PAD | Status: DC

## 2013-12-14 MED ORDER — RANITIDINE HCL 300 MG PO TABS: 300.0000 mg | ORAL_TABLET | Freq: Every day | ORAL | Status: DC

## 2013-12-14 NOTE — Progress Notes (Signed)
Subjective:    Patient ID: [name], female    DOB: 12-May-1961, 53 y.o.   MRN: [id number]  HPI Patient is here today for followup of her multiple medical problems. She has history of hyperlipidemia. She is currently on Crestor 20 mg by mouth daily. Due to her history of cardiovascular disease as well as peripheral vascular disease, gall LDL cholesterol is less than 70. She denies any myalgias right upper quadrant pain. Unfortunately she continues to smoke. She smokes approximate one half a pack per day to one pack of cigarettes per day. She has no desire to quit smoking at the present time. She is also taking Niaspan as well. She denies any flushing. Her blood pressure is currently well controlled. She denies any chest pain short of breath or dyspnea on exertion. She is compliant taking aspirin 345 mg by mouth daily. She is also taking her beta blocker as well as her angiotensin receptor blocker for secondary prevention of cardiovascular disease. She is also currently taking metformin 500 mg by mouth daily for diabetes mellitus type 2. She states her fasting blood sugars are less than 130. Postprandial sugars are less than 180. She denies any polyuria, polydipsia, blurred vision. She is overdue for a TSH. Past Medical History  Diagnosis Date  . Hypertension   . Diabetes mellitus   . Arthritis     knees  . Blood transfusion 08/2007  . CHF (congestive heart failure)   . GERD (gastroesophageal reflux disease)   . Hyperlipidemia   . Hypothyroidism   . Coronary artery disease   . Claudication   . Tobacco abuse    No past surgical history on file. Current Outpatient Prescriptions on File Prior to Visit  Medication Sig Dispense Refill  . ACCU-CHEK SOFTCLIX LANCETS lancets USE AS INSTRUCTED  300 each  3  . aspirin 325 MG tablet Take 325 mg by mouth daily.        . Blood Glucose Calibration (ACCU-CHEK AVIVA) SOLN Use as directed  3 each  3  . Blood Glucose Monitoring Suppl (ACCU-CHEK  AVIVA PLUS) W/DEVICE KIT Use as directed  1 kit  0  . carvedilol (COREG) 25 MG tablet Take 1 tablet (25 mg total) by mouth 2 (two) times daily with a meal.  180 tablet  1  . cyclobenzaprine (FLEXERIL) 5 MG tablet Take 5 mg by mouth every 8 (eight) hours as needed.      . furosemide (LASIX) 20 MG tablet Take 1 tablet (20 mg total) by mouth daily.  90 tablet  1  . glipiZIDE (GLUCOTROL) 10 MG tablet Take 1 tablet (10 mg total) by mouth 2 (two) times daily before a meal.  180 tablet  3  . glucose blood (ACCU-CHEK AVIVA PLUS) test strip Use as instructed  100 each  3  . levothyroxine (SYNTHROID, LEVOTHROID) 150 MCG tablet Take 1 tablet (150 mcg total) by mouth daily before breakfast.  90 tablet  3  . losartan (COZAAR) 50 MG tablet Take 1 tablet (50 mg total) by mouth daily.  90 tablet  3  . metFORMIN (GLUCOPHAGE) 500 MG tablet Take 1 tablet (500 mg total) by mouth daily with breakfast.  90 tablet  3  . mometasone (ELOCON) 0.1 % lotion Apply topically daily.  60 mL  0  . niacin (NIASPAN) 1000 MG CR tablet Take 2 tablets (2,000 mg total) by mouth at bedtime.  180 tablet  3  . nitroGLYCERIN (NITROSTAT) 0.4 MG SL tablet Place 1 tablet (  0.4 mg total) under the tongue every 5 (five) minutes as needed.  100 tablet  3  . potassium chloride (K-DUR,KLOR-CON) 10 MEQ tablet Take 1 tablet (10 mEq total) by mouth daily.  90 tablet  3  . rosuvastatin (CRESTOR) 20 MG tablet Take 1 tablet (20 mg total) by mouth daily.  90 tablet  3   No current facility-administered medications on file prior to visit.   No Known Allergies History   Social History  . Marital Status: Legally Separated    Spouse Name: N/A    Number of Children: N/A  . Years of Education: N/A   Occupational History  . Not on file.   Social History Main Topics  . Smoking status: Current Every Day Smoker -- 0.50 packs/day for 32 years    Types: Cigarettes  . Smokeless tobacco: Never Used  . Alcohol Use: No  . Drug Use: No  . Sexual Activity:  Not on file   Other Topics Concern  . Not on file   Social History Narrative  . No narrative on file     Review of Systems  All other systems reviewed and are negative.      Objective:   Physical Exam  Vitals reviewed. Constitutional: She appears well-developed and well-nourished. No distress.  HENT:  Mouth/Throat: No oropharyngeal exudate.  Eyes: Conjunctivae are normal. No scleral icterus.  Neck: Neck supple. No JVD present. No thyromegaly present.  Cardiovascular: Normal rate, regular rhythm and normal heart sounds.  Exam reveals no gallop and no friction rub.   No murmur heard. Pulmonary/Chest: Effort normal and breath sounds normal. No respiratory distress. She has no wheezes. She has no rales.  Abdominal: Soft. Bowel sounds are normal. She exhibits no distension. There is no tenderness. There is no rebound and no guarding.  Musculoskeletal: She exhibits no edema.  Lymphadenopathy:    She has no cervical adenopathy.  Skin: She is not diaphoretic.          Assessment & Plan:  1. Type II or unspecified type diabetes mellitus without mention of complication, not stated as uncontrolled Check urine microalbumin. If elevated I will increase her losartan to 100 mg by mouth daily. Otherwise I will not make any changes to losartan dose. I will also check a hemoglobin A1c. Goal hemoglobin A1c was less than 6.5. I recommended diet exercise and weight loss. Also recommend smoking cessation however the patient is in the pre-contemplative phase and has no desire to quit smoking at the present time. - COMPLETE METABOLIC PANEL WITH GFR - Lipid panel - Hemoglobin A1c - Microalbumin, urine  2. Unspecified hypothyroidism - TSH  3. Essential hypertension The patient currently well controlled. Only no changes in her medication at this time. I again counseled smoking cessation  4. HLD (hyperlipidemia) Check fasting lipid panel. Goal LDL cholesterol is less than 70.

## 2013-12-15 LAB — MICROALBUMIN, URINE: MICROALB UR: 1.21 mg/dL (ref 0.00–1.89)

## 2013-12-17 ENCOUNTER — Encounter: Payer: Self-pay | Admitting: Family Medicine

## 2013-12-25 ENCOUNTER — Ambulatory Visit: Payer: Medicare HMO | Admitting: Cardiology

## 2014-01-09 ENCOUNTER — Other Ambulatory Visit: Payer: Self-pay | Admitting: Cardiovascular Disease

## 2014-01-10 NOTE — Telephone Encounter (Signed)
Rx refill sent to patient pharmacy   

## 2014-01-14 ENCOUNTER — Ambulatory Visit (INDEPENDENT_AMBULATORY_CARE_PROVIDER_SITE_OTHER): Payer: Commercial Managed Care - HMO | Admitting: Cardiology

## 2014-01-14 ENCOUNTER — Encounter: Payer: Self-pay | Admitting: Cardiology

## 2014-01-14 VITALS — BP 122/72 | HR 63 | Ht 66.0 in | Wt 239.0 lb

## 2014-01-14 DIAGNOSIS — R400 Somnolence: Secondary | ICD-10-CM

## 2014-01-14 DIAGNOSIS — R404 Transient alteration of awareness: Secondary | ICD-10-CM

## 2014-01-14 DIAGNOSIS — E785 Hyperlipidemia, unspecified: Secondary | ICD-10-CM

## 2014-01-14 DIAGNOSIS — I10 Essential (primary) hypertension: Secondary | ICD-10-CM

## 2014-01-14 DIAGNOSIS — I739 Peripheral vascular disease, unspecified: Secondary | ICD-10-CM

## 2014-01-14 DIAGNOSIS — I2510 Atherosclerotic heart disease of native coronary artery without angina pectoris: Secondary | ICD-10-CM

## 2014-01-14 NOTE — Progress Notes (Signed)
Clinical Summary [name] is a 53 y.o.female last seen by Dr Gwenlyn Found, this is our first visit together. She is seen for the following medical problems.  1. HTN - compliant with meds - checks bp at home occasionally, typically 120s/70s  2. PAD - mildly abnormal ABI 02/2013, evidence of focal left SFA disease.  - denies any recent claudication symptoms  3. Hyperlipidmeia - compliant with crestor and niacin - 11/2013 TC 111 TG 87 HDL 36 LDL 58 - on niacin feels flushed all over, "feels like real bad sunburn" that lasts for 10-15 minutes after taking  4. Tobacco - 1/2 ppd - has tried patches with some benefit, considering e-cig.   5. CAD - cath 2009 with CTO of RCA and otherwise moderate non-obstructive disease ,  - reports some occasional chest pain with high levels of exertion. Does not occur at rest. Resolves with rest. Stable symptoms over last several years  6. History of systolic dysfuntion - LVEF at that time 25-30% by LV gram in 2009 - LVEF has since normalized to 55-60% by echo 10/2013.  - denies any SOB or DOE, no LE edema - compliant with meds  7. OSA? - +snore, has been told of apneic episodes, + daytime somnolence.   Past Medical History  Diagnosis Date  . Hypertension   . Diabetes mellitus   . Arthritis     knees  . Blood transfusion 08/2007  . CHF (congestive heart failure)   . GERD (gastroesophageal reflux disease)   . Hyperlipidemia   . Hypothyroidism   . Coronary artery disease   . Claudication   . Tobacco abuse      No Known Allergies   Current Outpatient Prescriptions  Medication Sig Dispense Refill  . ACCU-CHEK SOFTCLIX LANCETS lancets USE AS INSTRUCTED  300 each  3  . Alcohol Swabs (B-D SINGLE USE SWABS REGULAR) PADS Use as directed  100 each  3  . aspirin 325 MG tablet Take 325 mg by mouth daily.        . Blood Glucose Calibration (ACCU-CHEK AVIVA) SOLN Use as directed  3 each  3  . Blood Glucose Monitoring Suppl (ACCU-CHEK AVIVA  PLUS) W/DEVICE KIT Use as directed  1 kit  0  . carvedilol (COREG) 25 MG tablet TAKE 1 TABLET TWICE DAILY WITH MEALS  180 tablet  0  . cyclobenzaprine (FLEXERIL) 5 MG tablet Take 5 mg by mouth every 8 (eight) hours as needed.      . furosemide (LASIX) 20 MG tablet TAKE 1 TABLET EVERY DAY  90 tablet  0  . glipiZIDE (GLUCOTROL) 10 MG tablet Take 1 tablet (10 mg total) by mouth 2 (two) times daily before a meal.  180 tablet  3  . glucose blood (ACCU-CHEK AVIVA PLUS) test strip Use as instructed  100 each  3  . levothyroxine (SYNTHROID, LEVOTHROID) 150 MCG tablet Take 1 tablet (150 mcg total) by mouth daily before breakfast.  90 tablet  3  . losartan (COZAAR) 50 MG tablet Take 1 tablet (50 mg total) by mouth daily.  90 tablet  3  . metFORMIN (GLUCOPHAGE) 500 MG tablet Take 1 tablet (500 mg total) by mouth daily with breakfast.  90 tablet  3  . mometasone (ELOCON) 0.1 % lotion Apply topically daily.  60 mL  0  . niacin (NIASPAN) 1000 MG CR tablet Take 2 tablets (2,000 mg total) by mouth at bedtime.  180 tablet  3  . nitroGLYCERIN (NITROSTAT) 0.4  MG SL tablet Place 1 tablet (0.4 mg total) under the tongue every 5 (five) minutes as needed.  100 tablet  3  . potassium chloride (K-DUR,KLOR-CON) 10 MEQ tablet Take 1 tablet (10 mEq total) by mouth daily.  90 tablet  3  . ranitidine (ZANTAC) 300 MG tablet Take 1 tablet (300 mg total) by mouth at bedtime.  90 tablet  3  . rosuvastatin (CRESTOR) 20 MG tablet Take 1 tablet (20 mg total) by mouth daily.  90 tablet  3   No current facility-administered medications for this visit.     No past surgical history on file.   No Known Allergies    Family History  Problem Relation Age of Onset  . Heart disease Mother   . Stroke Mother   . Heart disease Father   . Heart disease Maternal Grandmother   . Stroke Maternal Grandmother   . Heart disease Maternal Grandfather   . Kidney disease Paternal Grandmother   . Heart disease Paternal Grandfather       Social History [name] reports that she has been smoking Cigarettes.  She has a 16 pack-year smoking history. She has never used smokeless tobacco. [name] reports that she does not drink alcohol.   Review of Systems CONSTITUTIONAL: No weight loss, fever, chills, weakness or fatigue.  HEENT: Eyes: No visual loss, blurred vision, double vision or yellow sclerae.No hearing loss, sneezing, congestion, runny nose or sore throat.  SKIN: No rash or itching.  CARDIOVASCULAR: per HPI RESPIRATORY: No shortness of breath, cough or sputum.  GASTROINTESTINAL: No anorexia, nausea, vomiting or diarrhea. No abdominal pain or blood.  GENITOURINARY: No burning on urination, no polyuria NEUROLOGICAL: No headache, dizziness, syncope, paralysis, ataxia, numbness or tingling in the extremities. No change in bowel or bladder control.  MUSCULOSKELETAL: No muscle, back pain, joint pain or stiffness.  LYMPHATICS: No enlarged nodes. No history of splenectomy.  PSYCHIATRIC: No history of depression or anxiety.  ENDOCRINOLOGIC: No reports of sweating, cold or heat intolerance. No polyuria or polydipsia.  Marland Kitchen   Physical Examination p 63 bp 122/72 Wt 239 lbs BMI 39  Gen: resting comfortably, no acute distress HEENT: no scleral icterus, pupils equal round and reactive, no palptable cervical adenopathy,  CV: RRR, no m/r/g, no JVD, no carotid bruits Resp: Clear to auscultation bilaterally GI: abdomen is soft, non-tender, non-distended, normal bowel sounds, no hepatosplenomegaly MSK: extremities are warm, no edema.  Skin: warm, no rash Neuro:  no focal deficits Psych: appropriate affect   Diagnostic Studies  10/2013 Echo Study Conclusions  - Left ventricle: The cavity size was normal. There was moderate concentric hypertrophy. Systolic function was normal. The estimated ejection fraction was in the range of 55% to 60%. Wall motion was normal; there were no regional wall motion abnormalities. Left  ventricular diastolic function parameters were normal. - Left atrium: The atrium was mildly dilated. - Tricuspid valve: There was trivial regurgitation. - Inferior vena cava: The vessel was normal in size. The respirophasic diameter changes were in the normal range (= 50%), consistent with normal central venous pressure.  02/2013 ABI <50% mid to distal right SFA RIght ABI 0.92  >50% focal stenosis left SFA Left ABI 0.81  08/2007 Cath HEMODYNAMIC DATA:  1. Right atrial pressure 11.  2. RV 37/13.  3. Pulmonary artery 36/20, mean 28.  4. Pulmonary capillary wedge 19.  5. LV 112/80.  6. Aortic 102/62, mean 78.  7. Superior vena cava saturation 61%.  8. Pulmonary artery saturation  64%.  9. Aortic saturation 96%.  10.Fick cardiac output 5.8 L/min.  11.Fick cardiac index 2.7 L/min/m2.  12.Thermodilution cardiac output through 7.3 L/min.  13.Thermodilution cardiac index 3.45 L/min/m2.  ANGIOGRAPHIC DATA:  1. Ventriculography in the RAO projection reveals a dilated left  ventricle which was poorly contractile. Ejection fraction to be  estimated at 25%-30%. In particular, the inferior wall appears to  be akinetic while everything else was hypokinetic.  2. The left main is free of critical disease.  3. The LAD courses to the apex. There is diffuse calcified disease in  the proximal midportion of the LAD with about 50%-60% narrowing.  There is a segmental smooth plaque in the mid distal LAD of 50%-60%  as well. There is diffuse luminal irregularity throughout the  diagonal and distal LAD system, but not critical stenosis.  4. The circumflex is a very large vessel. There are 2 tiny marginal  branches that are bit bend. In the bend, there is 50%-60%  narrowing. This was followed by a Rutger Salton and another Ryleigh Buenger. A  sub-Herron Fero has 50% narrowing and then a distal sub-Gracelynne Benedict has about  90% narrowing very peripherally.  5. The right coronary artery is subtotally occluded proximally and  then  subtotally occluded again. There is competitive filling of  the mid vessel. There is evidence of collaterals from left to  right.  CONCLUSION:  1. Moderately severe to severe reduction in overall left ventricular  function, probably due to discontinuation of antihypertensive  medications and coronary artery disease.  2. Moderately severe scattered disease of the LAD and circumflex that  does not appear to be critical.  3. Collateralized right coronary artery that supplies what appears to  be probably nearly akinetic segment in the inferior wall.  DISPOSITION: The patient's compliance has been poor. She has stopped  medications on multiple occasions. She is currently hypothyroid, and as  a result we have restarted her Synthroid. Optimally, we will get her  back to euthyroid status at the present time, get her back on her  medications, and eventually follow her up. I think she can be treated  medically for now. At the present time, I am not sure that much would  be gained by percutaneous intervention of the right coronary artery but  I will review this with my colleagues. Most importantly, she will need  to discontinue smoking and take better care of herself, and then this  may be a significant challenge.   Assessment and Plan  1. HTN - at goal, continue current meds  2. PAD - mild by ABIs last year, denies any current symptoms - continue risk factor modificaiton, educated on associations with smoking  3. Hyperlipidemia - stop niacin due to side effects and lack of evidence of clinical benefit - continue statin  4. Tobacco - counseled on associated health risk and advised to stop - she is going to try the e-cig  5. CAD - continue risk factor modication - change ASA to 19m daily  6. OSA?  - several symptoms of OSA, refer for sleep study   F/u 1 year    JArnoldo Lenis M.D., F.A.C.C.

## 2014-01-14 NOTE — Patient Instructions (Signed)
Your physician wants you to follow-up in: 1 year You will receive a reminder letter in the mail two months in advance. If you don't receive a letter, please call our office to schedule the follow-up appointment.    Your physician has recommended you make the following change in your medication:    DECREASE Aspirin to 81 mg daily  STOP Niacin  You have been referred to Dr.Ed Juanetta GoslingHawkins for a sleep study,expect a call from their office    Thank you for choosing [facility] !

## 2014-03-05 ENCOUNTER — Encounter: Payer: Self-pay | Admitting: Family Medicine

## 2014-03-05 ENCOUNTER — Ambulatory Visit: Payer: Commercial Managed Care - HMO

## 2014-03-05 ENCOUNTER — Ambulatory Visit (INDEPENDENT_AMBULATORY_CARE_PROVIDER_SITE_OTHER): Payer: Commercial Managed Care - HMO | Admitting: Family Medicine

## 2014-03-05 VITALS — BP 132/78 | HR 72 | Temp 97.9°F | Resp 18 | Ht 66.0 in | Wt 238.0 lb

## 2014-03-05 DIAGNOSIS — J209 Acute bronchitis, unspecified: Secondary | ICD-10-CM

## 2014-03-05 DIAGNOSIS — R52 Pain, unspecified: Secondary | ICD-10-CM

## 2014-03-05 LAB — INFLUENZA A AND B
INFLUENZA B AG: NEGATIVE
Inflenza A Ag: NEGATIVE

## 2014-03-05 MED ORDER — AZITHROMYCIN 250 MG PO TABS: ORAL_TABLET | ORAL | Status: DC

## 2014-03-05 MED ORDER — ALBUTEROL SULFATE HFA 108 (90 BASE) MCG/ACT IN AERS: 2.0000 | INHALATION_SPRAY | Freq: Four times a day (QID) | RESPIRATORY_TRACT | Status: DC | PRN

## 2014-03-05 NOTE — Progress Notes (Signed)
    Subjective:    Patient ID: [name], female    DOB: [birth year], 53 y.o.   MRN: [id number]  Patient presents for Generalized Body Aches, Cough and head congestion  Patient here with cough with wheeze production of clear sputum and congestion body aches for the past 4 days. States her cough is worsening. She is a smoker but no underlying history of COPD emphysema or asthma. She's tried taking some Mucinex decongestants despite her heart failure and heart disease. She states that her blood sugars have been good even with her illness. Positive sick contact with some friends of the family. She's not had any GI symptoms of nausea or diarrhea. She has had subjective fever   Review Of Systems:  GEN- +s fatigue,+ fever, weight loss,weakness, recent illness HEENT- denies eye drainage, change in vision, nasal discharge, CVS- denies chest pain, palpitations RESP- +SOB, cough, +wheeze ABD- denies N/V, change in stools, abd pain GU- denies dysuria, hematuria, dribbling, incontinence MSK- denies joint pain, muscle aches, injury Neuro- denies headache, dizziness, syncope, seizure activity       Objective:    BP 132/78  Pulse 72  Temp(Src) 97.9 F (36.6 C) (Oral)  Resp 18  Ht 5\' 6"  (1.676 m)  Wt 238 lb (107.956 kg)  BMI 38.43 kg/m2 GEN- NAD, alert and oriented x3 HEENT- PERRL, EOMI, non injected sclera, pink conjunctiva, MMM, oropharynx clear, no maxillary sinus tenderness, nares clear Neck- Supple, no LAD CVS- RRR, no murmur RESP-Scattered wheeze, mild rhonchi left base clears some with cough, normal WOB EXT- No edema Pulses- Radial 2+        Assessment & Plan:      Problem List Items Addressed This Visit   None    Visit Diagnoses   Body aches    -  Primary    Relevant Orders       Influenza a and b (Completed)    Acute bronchitis, unspecified organism        Treat with albuterol, mucinex DM, zpak coverage due to smoking and other co-morbidities, flu NEG       Note: This dictation was prepared with Dragon dictation along with smaller phrase technology. Any transcriptional errors that result from this process are unintentional.          Note: This dictation was prepared with Dragon dictation along with smaller phrase technology. Any transcriptional errors that result from this process are unintentional.

## 2014-03-05 NOTE — Patient Instructions (Signed)
Take antibiotics Use Mucinex DM/ robitussin DM  Use albuterol as needed  F/u as needed

## 2014-03-07 ENCOUNTER — Other Ambulatory Visit (HOSPITAL_COMMUNITY): Payer: Self-pay | Admitting: Respiratory Therapy

## 2014-03-07 ENCOUNTER — Telehealth: Payer: Self-pay | Admitting: Family Medicine

## 2014-03-07 DIAGNOSIS — G4730 Sleep apnea, unspecified: Secondary | ICD-10-CM

## 2014-03-07 NOTE — Telephone Encounter (Signed)
Patient would like a referral for a sleep study if possible  [phone]

## 2014-03-11 ENCOUNTER — Other Ambulatory Visit: Payer: Self-pay | Admitting: Cardiovascular Disease

## 2014-03-12 NOTE — Telephone Encounter (Signed)
Rx was sent to pharmacy electronically. 

## 2014-03-14 ENCOUNTER — Telehealth: Payer: Self-pay | Admitting: Family Medicine

## 2014-03-14 MED ORDER — NIACIN ER (ANTIHYPERLIPIDEMIC) 1000 MG PO TBCR: 1000.0000 mg | EXTENDED_RELEASE_TABLET | Freq: Every day | ORAL | Status: DC

## 2014-03-14 NOTE — Telephone Encounter (Signed)
[phone] [facility] [place]  Pt is needing a refill on Niacin 1000 MG   Pt is completley out and it needs to [facility] to [facility] because it will take her 10 days to get it [facility]

## 2014-03-14 NOTE — Telephone Encounter (Signed)
Submitted referral thru [facility] for Sleep study at [facility], with authorization [id number]

## 2014-03-14 NOTE — Telephone Encounter (Signed)
Me sent to pharm

## 2014-03-14 NOTE — Telephone Encounter (Signed)
Pt called back stating needing a referral from [employer] to have sleep study done, Dr. Juanetta GoslingHawkins office referred pt to [facility] but [facility]. [employer] referral has been iniated.

## 2014-03-15 ENCOUNTER — Telehealth: Payer: Self-pay | Admitting: *Deleted

## 2014-03-15 NOTE — Telephone Encounter (Signed)
Submitted [employer] referral thru [facility] connect for authorization to [facility] with authorization number [id number], paper copy will be faxed to Sleep Center.

## 2014-04-03 ENCOUNTER — Ambulatory Visit: Payer: Medicare HMO | Attending: Neurology | Admitting: Sleep Medicine

## 2014-04-03 DIAGNOSIS — I10 Essential (primary) hypertension: Secondary | ICD-10-CM | POA: Insufficient documentation

## 2014-04-03 DIAGNOSIS — G4730 Sleep apnea, unspecified: Secondary | ICD-10-CM

## 2014-04-03 DIAGNOSIS — G4733 Obstructive sleep apnea (adult) (pediatric): Secondary | ICD-10-CM | POA: Insufficient documentation

## 2014-04-03 DIAGNOSIS — G4710 Hypersomnia, unspecified: Secondary | ICD-10-CM | POA: Diagnosis present

## 2014-04-16 NOTE — Sleep Study (Signed)
[name] A. Merlene Laughter, MD     [email or web address]        NOCTURNAL POLYSOMNOGRAM    LOCATION: SLEEP LAB FACILITY: [place]   PHYSICIAN: Livi Mcgann A. Merlene [name], M.D.   DATE OF STUDY: 04/03/2014.   REFERRING PHYSICIAN:  Sinda Du.  INDICATIONS: The patient is a 53 year old female who presents with hypersomnia, snoring and hypertension.  MEDICATIONS:  Prior to Admission medications   Medication Sig Start Date End Date Taking? Authorizing Provider  ACCU-CHEK SOFTCLIX LANCETS lancets USE AS INSTRUCTED 10/29/13   Susy Frizzle, MD  albuterol (PROVENTIL HFA;VENTOLIN HFA) 108 (90 BASE) MCG/ACT inhaler Inhale 2 puffs into the lungs every 6 (six) hours as needed for wheezing or shortness of breath. 03/05/14   Alycia Rossetti, MD  Alcohol Swabs (B-D SINGLE USE SWABS REGULAR) PADS Use as directed 12/14/13   Susy Frizzle, MD  aspirin 81 MG tablet Take 81 mg by mouth daily.    Historical Provider, MD  azithromycin (ZITHROMAX) 250 MG tablet Take 2 tablets x 1 day, then 1 tab daily for 4 days 03/05/14   Alycia Rossetti, MD  Blood Glucose Calibration (ACCU-CHEK AVIVA) SOLN Use as directed 08/29/13   Susy Frizzle, MD  Blood Glucose Monitoring Suppl (ACCU-CHEK AVIVA PLUS) W/DEVICE KIT Use as directed 08/29/13   Susy Frizzle, MD  carvedilol (COREG) 25 MG tablet TAKE 1 TABLET TWICE DAILY WITH MEALS 03/12/14   Lorretta Harp, MD  cyclobenzaprine (FLEXERIL) 5 MG tablet Take 5 mg by mouth every 8 (eight) hours as needed.    Historical Provider, MD  furosemide (LASIX) 20 MG tablet TAKE 1 TABLET EVERY DAY 01/10/14   Lorretta Harp, MD  glipiZIDE (GLUCOTROL) 10 MG tablet Take 1 tablet (10 mg total) by mouth 2 (two) times daily before a meal. 08/29/13   Susy Frizzle, MD  glucose blood (ACCU-CHEK AVIVA PLUS) test strip Use as instructed 08/29/13   Susy Frizzle, MD  levothyroxine (SYNTHROID, LEVOTHROID) 150 MCG tablet Take 1 tablet (150 mcg total) by mouth daily before  breakfast. 08/29/13   Susy Frizzle, MD  losartan (COZAAR) 50 MG tablet Take 1 tablet (50 mg total) by mouth daily. 08/29/13   Susy Frizzle, MD  metFORMIN (GLUCOPHAGE) 500 MG tablet Take 1 tablet (500 mg total) by mouth daily with breakfast. 08/29/13   Susy Frizzle, MD  mometasone (ELOCON) 0.1 % lotion Apply topically daily. 07/30/13   Susy Frizzle, MD  niacin (NIASPAN) 1000 MG CR tablet Take 1 tablet (1,000 mg total) by mouth at bedtime. 03/14/14   Susy Frizzle, MD  nitroGLYCERIN (NITROSTAT) 0.4 MG SL tablet Place 1 tablet (0.4 mg total) under the tongue every 5 (five) minutes as needed. 08/29/13   Susy Frizzle, MD  potassium chloride (K-DUR,KLOR-CON) 10 MEQ tablet Take 1 tablet (10 mEq total) by mouth daily. 08/29/13   Susy Frizzle, MD  ranitidine (ZANTAC) 300 MG tablet Take 1 tablet (300 mg total) by mouth at bedtime. 12/14/13   Susy Frizzle, MD  rosuvastatin (CRESTOR) 20 MG tablet Take 1 tablet (20 mg total) by mouth daily. 08/29/13   Susy Frizzle, MD      EPWORTH SLEEPINESS SCALE: 17.   BMI: 38.   ARCHITECTURAL SUMMARY: Total recording time was 374 minutes. Sleep efficiency 76 %. Sleep latency 10 minutes. REM latency 51 minutes. Stage NI 13 %, N2 63 % and N3 9 % and REM sleep 15 %.  RESPIRATORY DATA:  Baseline oxygen saturation is 94 %. The lowest saturation is 86 %. The diagnostic AHI is 29. The RDI is 30. Supine REM AHI is 8.supine AHI 57. Positive pressure treatment was attempted by the patient could not tolerate this.  LIMB MOVEMENT SUMMARY: PLM index 4.   ELECTROCARDIOGRAM SUMMARY: Average heart rate is 62 with no significant dysrhythmias observed.   IMPRESSION:  1. Moderate obstructive sleep apnea syndrome worse in the supine position. The patient cannot tolerate positive pressure treatment. A formal CPAP titration recording is suggested.  Thanks for this referral.  [name], M.D. Diplomat, Tax adviser of Sleep Medicine.

## 2014-05-09 ENCOUNTER — Ambulatory Visit (INDEPENDENT_AMBULATORY_CARE_PROVIDER_SITE_OTHER): Payer: Commercial Managed Care - HMO | Admitting: *Deleted

## 2014-05-09 DIAGNOSIS — Z23 Encounter for immunization: Secondary | ICD-10-CM

## 2014-05-09 NOTE — Progress Notes (Signed)
Patient ID: Pricilla RiffleSherry M Czajkowski, female   DOB: 17-Dec-1960, 53 y.o.   MRN: [id number] Patient seen in office for Influenza Vaccination.   Tolerated IM administration well.   Immunization history updated.

## 2014-06-10 ENCOUNTER — Other Ambulatory Visit: Payer: Self-pay | Admitting: Family Medicine

## 2014-06-11 NOTE — Telephone Encounter (Signed)
Refill appropriate and filled per protocol. 

## 2014-07-05 DIAGNOSIS — G4733 Obstructive sleep apnea (adult) (pediatric): Secondary | ICD-10-CM | POA: Diagnosis not present

## 2014-07-12 ENCOUNTER — Other Ambulatory Visit: Payer: Self-pay | Admitting: Family Medicine

## 2014-07-16 ENCOUNTER — Encounter: Payer: Self-pay | Admitting: Family Medicine

## 2014-07-16 NOTE — Telephone Encounter (Signed)
Needs OV and Labs - will send letter

## 2014-07-22 ENCOUNTER — Telehealth: Payer: Self-pay | Admitting: Family Medicine

## 2014-07-22 MED ORDER — POTASSIUM CHLORIDE CRYS ER 10 MEQ PO TBCR: 10.0000 meq | EXTENDED_RELEASE_TABLET | Freq: Every day | ORAL | Status: DC

## 2014-07-22 NOTE — Telephone Encounter (Signed)
Med sent to requested pharm 

## 2014-07-22 NOTE — Telephone Encounter (Signed)
[name]  Patient is calling to get refill on her potassium med she said usually its mail order but she needs it to [name] this time    [phone]

## 2014-08-02 DIAGNOSIS — G4700 Insomnia, unspecified: Secondary | ICD-10-CM | POA: Diagnosis not present

## 2014-08-02 DIAGNOSIS — G4730 Sleep apnea, unspecified: Secondary | ICD-10-CM | POA: Diagnosis not present

## 2014-08-02 DIAGNOSIS — I10 Essential (primary) hypertension: Secondary | ICD-10-CM | POA: Diagnosis not present

## 2014-08-02 DIAGNOSIS — E119 Type 2 diabetes mellitus without complications: Secondary | ICD-10-CM | POA: Diagnosis not present

## 2014-08-03 DIAGNOSIS — G4733 Obstructive sleep apnea (adult) (pediatric): Secondary | ICD-10-CM | POA: Diagnosis not present

## 2014-09-03 DIAGNOSIS — G4733 Obstructive sleep apnea (adult) (pediatric): Secondary | ICD-10-CM | POA: Diagnosis not present

## 2014-09-17 ENCOUNTER — Encounter: Payer: Self-pay | Admitting: Family Medicine

## 2014-09-17 ENCOUNTER — Ambulatory Visit (INDEPENDENT_AMBULATORY_CARE_PROVIDER_SITE_OTHER): Payer: Commercial Managed Care - HMO | Admitting: Family Medicine

## 2014-09-17 VITALS — BP 140/80 | HR 80 | Temp 98.0°F | Resp 20 | Ht 66.0 in | Wt 241.0 lb

## 2014-09-17 DIAGNOSIS — I10 Essential (primary) hypertension: Secondary | ICD-10-CM | POA: Diagnosis not present

## 2014-09-17 DIAGNOSIS — E785 Hyperlipidemia, unspecified: Secondary | ICD-10-CM

## 2014-09-17 DIAGNOSIS — Z720 Tobacco use: Secondary | ICD-10-CM

## 2014-09-17 DIAGNOSIS — E119 Type 2 diabetes mellitus without complications: Secondary | ICD-10-CM

## 2014-09-17 DIAGNOSIS — F17200 Nicotine dependence, unspecified, uncomplicated: Secondary | ICD-10-CM

## 2014-09-17 LAB — CBC WITH DIFFERENTIAL/PLATELET
Basophils Absolute: 0.1 10*3/uL (ref 0.0–0.1)
Basophils Relative: 1 % (ref 0–1)
EOS PCT: 1 % (ref 0–5)
Eosinophils Absolute: 0.1 10*3/uL (ref 0.0–0.7)
HEMATOCRIT: 39.2 % (ref 36.0–46.0)
Hemoglobin: 12.9 g/dL (ref 12.0–15.0)
LYMPHS ABS: 2.5 10*3/uL (ref 0.7–4.0)
LYMPHS PCT: 33 % (ref 12–46)
MCH: 31.2 pg (ref 26.0–34.0)
MCHC: 32.9 g/dL (ref 30.0–36.0)
MCV: 94.9 fL (ref 78.0–100.0)
MONO ABS: 0.5 10*3/uL (ref 0.1–1.0)
MPV: 10.4 fL (ref 8.6–12.4)
Monocytes Relative: 6 % (ref 3–12)
Neutro Abs: 4.5 10*3/uL (ref 1.7–7.7)
Neutrophils Relative %: 59 % (ref 43–77)
Platelets: 208 10*3/uL (ref 150–400)
RBC: 4.13 MIL/uL (ref 3.87–5.11)
RDW: 12.8 % (ref 11.5–15.5)
WBC: 7.6 10*3/uL (ref 4.0–10.5)

## 2014-09-17 LAB — COMPLETE METABOLIC PANEL WITH GFR
ALBUMIN: 4 g/dL (ref 3.5–5.2)
ALT: 17 U/L (ref 0–35)
AST: 19 U/L (ref 0–37)
Alkaline Phosphatase: 70 U/L (ref 39–117)
BUN: 13 mg/dL (ref 6–23)
CO2: 24 meq/L (ref 19–32)
Calcium: 9.1 mg/dL (ref 8.4–10.5)
Chloride: 103 mEq/L (ref 96–112)
Creat: 0.73 mg/dL (ref 0.50–1.10)
GFR, Est African American: >89 mL/min
GFR, Est Non African American: >89 mL/min
Glucose, Bld: 122 mg/dL — ABNORMAL HIGH (ref 70–99)
Potassium: 4.5 mEq/L (ref 3.5–5.3)
Sodium: 139 mEq/L (ref 135–145)
Total Bilirubin: 0.4 mg/dL (ref 0.2–1.2)
Total Protein: 6.4 g/dL (ref 6.0–8.3)

## 2014-09-17 LAB — LIPID PANEL
Cholesterol: 118 mg/dL (ref 0–200)
HDL: 39 mg/dL — AB (ref >=46)
LDL CALC: 53 mg/dL (ref 0–99)
Total CHOL/HDL Ratio: 3 Ratio
Triglycerides: 132 mg/dL (ref <150)
VLDL: 26 mg/dL (ref 0–40)

## 2014-09-17 LAB — HEMOGLOBIN A1C
Hgb A1c MFr Bld: 6.2 % — ABNORMAL HIGH (ref <5.7)
Mean Plasma Glucose: 131 mg/dL — ABNORMAL HIGH (ref <117)

## 2014-09-17 NOTE — Progress Notes (Signed)
Subjective:    Patient ID: [name], female    DOB: [birth year], 54 y.o.   MRN: [id number]  HPI  Patient is here today for followup of her multiple medical problems. She has history of hyperlipidemia. She is currently on Crestor 20 mg by mouth daily. Due to her history of cardiovascular disease as well as peripheral vascular disease, her goal LDL cholesterol is less than 70. She denies any myalgias or right upper quadrant pain. Unfortunately she continues to smoke. She has no desire to quit smoking at the present time. She is also taking Niaspan as well. Her cardiologist recommended she stop niaspan and reduce aspirin to 81 mg poqday.  Her biggest problem with niaspan is flushing. Her blood pressure is currently well controlled. She denies any chest pain short of breath or dyspnea on exertion. She is taking her beta blocker as well as her angiotensin receptor blocker for secondary prevention of cardiovascular disease. She is also currently taking metformin 500 mg by mouth daily for diabetes mellitus type 2. She denies polyuria, polydipsia, or blurred vision. Past Medical History  Diagnosis Date  . Hypertension   . Diabetes mellitus   . Arthritis     knees  . Blood transfusion 08/2007  . CHF (congestive heart failure)   . GERD (gastroesophageal reflux disease)   . Hyperlipidemia   . Hypothyroidism   . Coronary artery disease   . Claudication   . Tobacco abuse    No past surgical history on file. Current Outpatient Prescriptions on File Prior to Visit  Medication Sig Dispense Refill  . ACCU-CHEK SOFTCLIX LANCETS lancets USE AS INSTRUCTED 300 each 3  . albuterol (PROVENTIL HFA;VENTOLIN HFA) 108 (90 BASE) MCG/ACT inhaler Inhale 2 puffs into the lungs every 6 (six) hours as needed for wheezing or shortness of breath. 1 Inhaler 0  . Alcohol Swabs (B-D SINGLE USE SWABS REGULAR) PADS Use as directed 100 each 3  . Blood Glucose Calibration (ACCU-CHEK AVIVA) SOLN Use as directed 3 each 3    . Blood Glucose Monitoring Suppl (ACCU-CHEK AVIVA PLUS) W/DEVICE KIT Use as directed 1 kit 0  . carvedilol (COREG) 25 MG tablet TAKE 1 TABLET TWICE DAILY WITH MEALS 180 tablet 1  . CRESTOR 20 MG tablet TAKE 1 TABLET EVERY DAY 90 tablet 0  . cyclobenzaprine (FLEXERIL) 5 MG tablet Take 5 mg by mouth every 8 (eight) hours as needed.    . furosemide (LASIX) 20 MG tablet TAKE 1 TABLET EVERY DAY 90 tablet 0  . glipiZIDE (GLUCOTROL) 10 MG tablet Take 1 tablet (10 mg total) by mouth 2 (two) times daily before a meal. 180 tablet 3  . glucose blood (ACCU-CHEK AVIVA PLUS) test strip Use as instructed 100 each 3  . levothyroxine (SYNTHROID, LEVOTHROID) 150 MCG tablet TAKE 1 TABLET DAILY BEFORE BREAKFAST. 90 tablet 3  . losartan (COZAAR) 50 MG tablet TAKE 1 TABLET DAILY. 90 tablet 3  . metFORMIN (GLUCOPHAGE) 500 MG tablet Take 1 tablet (500 mg total) by mouth daily with breakfast. 90 tablet 3  . mometasone (ELOCON) 0.1 % lotion Apply topically daily. 60 mL 0  . niacin (NIASPAN) 1000 MG CR tablet Take 1 tablet (1,000 mg total) by mouth at bedtime. 30 tablet 2  . nitroGLYCERIN (NITROSTAT) 0.4 MG SL tablet Place 1 tablet (0.4 mg total) under the tongue every 5 (five) minutes as needed. 100 tablet 3  . potassium chloride (K-DUR,KLOR-CON) 10 MEQ tablet Take 1 tablet (10 mEq total) by mouth daily.  90 tablet 3  . ranitidine (ZANTAC) 300 MG tablet Take 1 tablet (300 mg total) by mouth at bedtime. 90 tablet 3   No current facility-administered medications on file prior to visit.   No Known Allergies History   Social History  . Marital Status: Legally Separated    Spouse Name: N/A  . Number of Children: N/A  . Years of Education: N/A   Occupational History  . Not on file.   Social History Main Topics  . Smoking status: Current Every Day Smoker -- 0.50 packs/day for 32 years    Types: Cigarettes  . Smokeless tobacco: Never Used  . Alcohol Use: No  . Drug Use: No  . Sexual Activity: Not on file    Other Topics Concern  . Not on file   Social History Narrative     Review of Systems  All other systems reviewed and are negative.      Objective:   Physical Exam  Constitutional: She appears well-developed and well-nourished. No distress.  HENT:  Mouth/Throat: No oropharyngeal exudate.  Eyes: Conjunctivae are normal. No scleral icterus.  Neck: Neck supple. No JVD present. No thyromegaly present.  Cardiovascular: Normal rate, regular rhythm and normal heart sounds.  Exam reveals no gallop and no friction rub.   No murmur heard. Pulmonary/Chest: Effort normal and breath sounds normal. No respiratory distress. She has no wheezes. She has no rales.  Abdominal: Soft. Bowel sounds are normal. She exhibits no distension. There is no tenderness. There is no rebound and no guarding.  Musculoskeletal: She exhibits no edema.  Lymphadenopathy:    She has no cervical adenopathy.  Skin: She is not diaphoretic.  Vitals reviewed.         Assessment & Plan:  Diabetes mellitus type II, controlled - Plan: COMPLETE METABOLIC PANEL WITH GFR, Hemoglobin A1c, Lipid panel, Microalbumin, urine, CBC with Differential/Platelet  HLD (hyperlipidemia)  Essential hypertension  Smoking  Due to her peripheral vascular disease her history of heart disease, her diabetes, I strongly recommended the patient quit smoking. She has no desire to quit smoking at the present time. Her blood pressure is adequately controlled I did recommend that she monitor her blood pressure more closely at home and call me if her blood pressures consistently greater than 161 systolic. I will check a hemoglobin A1c as well as a urine microalbumin. I will also check a fasting lipid panel. Goal LDL cholesterol is less than 70. I told the patient I am okay with her discontinuing Niaspan if the flushing is problematic. She would like to stop that medicine at the present time.

## 2014-09-18 LAB — MICROALBUMIN, URINE: MICROALB UR: 1.4 mg/dL (ref <2.0)

## 2014-09-20 ENCOUNTER — Other Ambulatory Visit: Payer: Self-pay | Admitting: Family Medicine

## 2014-09-20 NOTE — Telephone Encounter (Signed)
Medication refilled per protocol. 

## 2014-10-03 DIAGNOSIS — G4733 Obstructive sleep apnea (adult) (pediatric): Secondary | ICD-10-CM | POA: Diagnosis not present

## 2014-10-04 DIAGNOSIS — G4733 Obstructive sleep apnea (adult) (pediatric): Secondary | ICD-10-CM | POA: Diagnosis not present

## 2014-10-25 ENCOUNTER — Other Ambulatory Visit: Payer: Self-pay | Admitting: Family Medicine

## 2014-10-25 NOTE — Telephone Encounter (Signed)
Refill appropriate and filled per protocol. 

## 2014-11-03 DIAGNOSIS — G4733 Obstructive sleep apnea (adult) (pediatric): Secondary | ICD-10-CM | POA: Diagnosis not present

## 2014-11-21 ENCOUNTER — Other Ambulatory Visit: Payer: Self-pay | Admitting: Family Medicine

## 2014-12-03 DIAGNOSIS — G4733 Obstructive sleep apnea (adult) (pediatric): Secondary | ICD-10-CM | POA: Diagnosis not present

## 2015-01-03 DIAGNOSIS — G4733 Obstructive sleep apnea (adult) (pediatric): Secondary | ICD-10-CM | POA: Diagnosis not present

## 2015-01-06 ENCOUNTER — Other Ambulatory Visit: Payer: Self-pay | Admitting: Cardiovascular Disease

## 2015-01-06 DIAGNOSIS — G4733 Obstructive sleep apnea (adult) (pediatric): Secondary | ICD-10-CM | POA: Diagnosis not present

## 2015-01-07 NOTE — Telephone Encounter (Signed)
REFILL 

## 2015-01-20 ENCOUNTER — Other Ambulatory Visit: Payer: Self-pay | Admitting: Family Medicine

## 2015-01-31 ENCOUNTER — Other Ambulatory Visit: Payer: Self-pay | Admitting: Cardiovascular Disease

## 2015-01-31 NOTE — Telephone Encounter (Signed)
Rx request sent to pharmacy.  

## 2015-02-03 DIAGNOSIS — G4733 Obstructive sleep apnea (adult) (pediatric): Secondary | ICD-10-CM | POA: Diagnosis not present

## 2015-03-04 ENCOUNTER — Encounter: Payer: Self-pay | Admitting: Family Medicine

## 2015-03-04 ENCOUNTER — Other Ambulatory Visit: Payer: Self-pay | Admitting: Family Medicine

## 2015-03-04 ENCOUNTER — Ambulatory Visit (INDEPENDENT_AMBULATORY_CARE_PROVIDER_SITE_OTHER): Payer: Commercial Managed Care - HMO | Admitting: Family Medicine

## 2015-03-04 VITALS — BP 130/80 | HR 80 | Temp 97.7°F | Resp 20 | Ht 66.0 in | Wt 247.0 lb

## 2015-03-04 DIAGNOSIS — F17200 Nicotine dependence, unspecified, uncomplicated: Secondary | ICD-10-CM

## 2015-03-04 DIAGNOSIS — E1159 Type 2 diabetes mellitus with other circulatory complications: Secondary | ICD-10-CM | POA: Diagnosis not present

## 2015-03-04 DIAGNOSIS — Z720 Tobacco use: Secondary | ICD-10-CM

## 2015-03-04 DIAGNOSIS — E1151 Type 2 diabetes mellitus with diabetic peripheral angiopathy without gangrene: Secondary | ICD-10-CM

## 2015-03-04 DIAGNOSIS — I10 Essential (primary) hypertension: Secondary | ICD-10-CM | POA: Diagnosis not present

## 2015-03-04 DIAGNOSIS — Z23 Encounter for immunization: Secondary | ICD-10-CM

## 2015-03-04 DIAGNOSIS — E785 Hyperlipidemia, unspecified: Secondary | ICD-10-CM

## 2015-03-04 DIAGNOSIS — I70209 Unspecified atherosclerosis of native arteries of extremities, unspecified extremity: Secondary | ICD-10-CM | POA: Diagnosis not present

## 2015-03-04 DIAGNOSIS — E1165 Type 2 diabetes mellitus with hyperglycemia: Secondary | ICD-10-CM | POA: Diagnosis not present

## 2015-03-04 DIAGNOSIS — IMO0002 Reserved for concepts with insufficient information to code with codable children: Secondary | ICD-10-CM

## 2015-03-04 LAB — CBC WITH DIFFERENTIAL/PLATELET
Basophils Absolute: 0.1 10*3/uL (ref 0.0–0.1)
Basophils Relative: 1 % (ref 0–1)
EOS PCT: 2 % (ref 0–5)
Eosinophils Absolute: 0.2 10*3/uL (ref 0.0–0.7)
HCT: 39.1 % (ref 36.0–46.0)
Hemoglobin: 13.5 g/dL (ref 12.0–15.0)
Lymphocytes Relative: 29 % (ref 12–46)
Lymphs Abs: 2.4 10*3/uL (ref 0.7–4.0)
MCH: 31.9 pg (ref 26.0–34.0)
MCHC: 34.5 g/dL (ref 30.0–36.0)
MCV: 92.4 fL (ref 78.0–100.0)
MONO ABS: 0.7 10*3/uL (ref 0.1–1.0)
MPV: 10.2 fL (ref 8.6–12.4)
Monocytes Relative: 8 % (ref 3–12)
Neutro Abs: 5 10*3/uL (ref 1.7–7.7)
Neutrophils Relative %: 60 % (ref 43–77)
Platelets: 192 10*3/uL (ref 150–400)
RBC: 4.23 MIL/uL (ref 3.87–5.11)
RDW: 13.2 % (ref 11.5–15.5)
WBC: 8.3 10*3/uL (ref 4.0–10.5)

## 2015-03-04 LAB — LIPID PANEL
CHOLESTEROL: 125 mg/dL (ref 125–200)
HDL: 36 mg/dL — ABNORMAL LOW (ref >=46)
LDL Cholesterol: 54 mg/dL (ref <130)
TRIGLYCERIDES: 173 mg/dL — AB (ref <150)
Total CHOL/HDL Ratio: 3.5 Ratio (ref <=5.0)
VLDL: 35 mg/dL — AB (ref <30)

## 2015-03-04 LAB — COMPLETE METABOLIC PANEL WITH GFR
ALT: 15 U/L (ref 6–29)
AST: 19 U/L (ref 10–35)
Albumin: 4.2 g/dL (ref 3.6–5.1)
Alkaline Phosphatase: 75 U/L (ref 33–130)
BUN: 16 mg/dL (ref 7–25)
CHLORIDE: 102 mmol/L (ref 98–110)
CO2: 26 mmol/L (ref 20–31)
Calcium: 9.4 mg/dL (ref 8.6–10.4)
Creat: 0.76 mg/dL (ref 0.50–1.05)
GFR, Est African American: >89 mL/min (ref >=60)
GFR, Est Non African American: >89 mL/min (ref >=60)
GLUCOSE: 104 mg/dL — AB (ref 70–99)
POTASSIUM: 4 mmol/L (ref 3.5–5.3)
SODIUM: 137 mmol/L (ref 135–146)
Total Bilirubin: 0.6 mg/dL (ref 0.2–1.2)
Total Protein: 6.8 g/dL (ref 6.1–8.1)

## 2015-03-04 MED ORDER — CARVEDILOL 25 MG PO TABS: 25.0000 mg | ORAL_TABLET | Freq: Two times a day (BID) | ORAL | Status: DC

## 2015-03-04 MED ORDER — FUROSEMIDE 20 MG PO TABS: 20.0000 mg | ORAL_TABLET | Freq: Every day | ORAL | Status: DC

## 2015-03-04 MED ORDER — METFORMIN HCL 500 MG PO TABS: ORAL_TABLET | ORAL | Status: DC

## 2015-03-04 MED ORDER — ROSUVASTATIN CALCIUM 20 MG PO TABS: 20.0000 mg | ORAL_TABLET | Freq: Every day | ORAL | Status: DC

## 2015-03-04 MED ORDER — NIACIN ER (ANTIHYPERLIPIDEMIC) 1000 MG PO TBCR: 2000.0000 mg | EXTENDED_RELEASE_TABLET | Freq: Every day | ORAL | Status: DC

## 2015-03-04 MED ORDER — GLIPIZIDE 10 MG PO TABS: ORAL_TABLET | ORAL | Status: DC

## 2015-03-04 MED ORDER — LOSARTAN POTASSIUM 50 MG PO TABS: 50.0000 mg | ORAL_TABLET | Freq: Every day | ORAL | Status: DC

## 2015-03-04 MED ORDER — POTASSIUM CHLORIDE CRYS ER 10 MEQ PO TBCR: 10.0000 meq | EXTENDED_RELEASE_TABLET | Freq: Every day | ORAL | Status: DC

## 2015-03-04 MED ORDER — LEVOTHYROXINE SODIUM 150 MCG PO TABS: ORAL_TABLET | ORAL | Status: DC

## 2015-03-04 NOTE — Progress Notes (Signed)
Subjective:    Patient ID: [name], female    DOB: Oct 20, 1960, 54 y.o.   MRN: [id number]  HPI 09/17/14 Patient is here today for followup of her multiple medical problems. She has history of hyperlipidemia. She is currently on Crestor 20 mg by mouth daily. Due to her history of cardiovascular disease as well as peripheral vascular disease, her goal LDL cholesterol is less than 70. She denies any myalgias or right upper quadrant pain. Unfortunately she continues to smoke. She has no desire to quit smoking at the present time. She is also taking Niaspan as well. Her cardiologist recommended she stop niaspan and reduce aspirin to 81 mg poqday.  Her biggest problem with niaspan is flushing. Her blood pressure is currently well controlled. She denies any chest pain short of breath or dyspnea on exertion. She is taking her beta blocker as well as her angiotensin receptor blocker for secondary prevention of cardiovascular disease. She is also currently taking metformin 500 mg by mouth daily for diabetes mellitus type 2. She denies polyuria, polydipsia, or blurred vision.  At that time, my plan was: Due to her peripheral vascular disease her history of heart disease, her diabetes, I strongly recommended the patient quit smoking. She has no desire to quit smoking at the present time. Her blood pressure is adequately controlled I did recommend that she monitor her blood pressure more closely at home and call me if her blood pressures consistently greater than 195 systolic. I will check a hemoglobin A1c as well as a urine microalbumin. I will also check a fasting lipid panel. Goal LDL cholesterol is less than 70. I told the patient I am okay with her discontinuing Niaspan if the flushing is problematic. She would like to stop that medicine at the present time.  03/04/15 Patient continues to smoke. She has no desire to quit. She is audibly wheezing today on examination. She denies any chest pain. She denies  any angina. She denies any claudication in her legs although she is extremely sedentary. She does not exercise at all. Diabetic foot exam is performed today. She has weakly palpable dorsalis pedis pulses bilaterally. She has known peripheral vascular disease with depressed ABIs on arterial Dopplers performed in 2014. She is compliant with aspirin. She is compliant with Crestor. She denies any myalgias or right upper quadrant pain. She is not regularly checking her sugars are low the last 3 mornings, her sugars have been ranging 120-140. She denies any polyuria, polydipsia, or blurred vision. She is due today for her flu shot. She is also due for diabetic eye exam although she has not scheduled this yet. Past Medical History  Diagnosis Date  . Hypertension   . Diabetes mellitus   . Arthritis     knees  . Blood transfusion 08/2007  . CHF (congestive heart failure) ([place])   . GERD (gastroesophageal reflux disease)   . Hyperlipidemia   . Hypothyroidism   . Coronary artery disease   . Claudication ([place])   . Tobacco abuse    No past surgical history on file. Current Outpatient Prescriptions on File Prior to Visit  Medication Sig Dispense Refill  . ACCU-CHEK AVIVA PLUS test strip USE AS INSTRUCTED 300 each 3  . ACCU-CHEK SOFTCLIX LANCETS lancets USE AS INSTRUCTED 300 each 3  . albuterol (PROVENTIL HFA;VENTOLIN HFA) 108 (90 BASE) MCG/ACT inhaler Inhale 2 puffs into the lungs every 6 (six) hours as needed for wheezing or shortness of breath. 1 Inhaler 0  .  Alcohol Swabs (B-D SINGLE USE SWABS REGULAR) PADS Use as directed 100 each 3  . aspirin 325 MG tablet Take 325 mg by mouth daily.    . Blood Glucose Calibration (ACCU-CHEK AVIVA) SOLN Use as directed 3 each 3  . Blood Glucose Monitoring Suppl (ACCU-CHEK AVIVA PLUS) W/DEVICE KIT Use as directed 1 kit 0  . carvedilol (COREG) 25 MG tablet Take 1 tablet (25 mg total) by mouth 2 (two) times daily with a meal. NEED OV. 180 tablet 0  . cyclobenzaprine  (FLEXERIL) 5 MG tablet Take 5 mg by mouth every 8 (eight) hours as needed.    . furosemide (LASIX) 20 MG tablet TAKE 1 TABLET EVERY DAY 30 tablet 0  . glipiZIDE (GLUCOTROL) 10 MG tablet TAKE 1 TABLET 2 (TWO) TIMES DAILY BEFORE A MEAL. 180 tablet 3  . levothyroxine (SYNTHROID, LEVOTHROID) 150 MCG tablet TAKE 1 TABLET DAILY BEFORE BREAKFAST. 90 tablet 3  . losartan (COZAAR) 50 MG tablet TAKE 1 TABLET DAILY. 90 tablet 3  . metFORMIN (GLUCOPHAGE) 500 MG tablet TAKE 1 TABLET DAILY WITH BREAKFAST. 90 tablet 1  . mometasone (ELOCON) 0.1 % lotion Apply topically daily. 60 mL 0  . niacin (NIASPAN) 1000 MG CR tablet TAKE 2 TABLETS AT BEDTIME 180 tablet 3  . nitroGLYCERIN (NITROSTAT) 0.4 MG SL tablet Place 1 tablet (0.4 mg total) under the tongue every 5 (five) minutes as needed. 100 tablet 3  . potassium chloride (K-DUR,KLOR-CON) 10 MEQ tablet Take 1 tablet (10 mEq total) by mouth daily. 90 tablet 3  . ranitidine (ZANTAC) 300 MG tablet TAKE 1 TABLET AT BEDTIME 90 tablet 3  . rosuvastatin (CRESTOR) 20 MG tablet TAKE 1 TABLET EVERY DAY 90 tablet 1  . traZODone (DESYREL) 50 MG tablet Take 50 mg by mouth at bedtime.      No current facility-administered medications on file prior to visit.   No Known Allergies Social History   Social History  . Marital Status: Legally Separated    Spouse Name: N/A  . Number of Children: N/A  . Years of Education: N/A   Occupational History  . Not on file.   Social History Main Topics  . Smoking status: Current Every Day Smoker -- 0.50 packs/day for 32 years    Types: Cigarettes  . Smokeless tobacco: Never Used  . Alcohol Use: No  . Drug Use: No  . Sexual Activity: Not on file   Other Topics Concern  . Not on file   Social History Narrative     Review of Systems  All other systems reviewed and are negative.      Objective:   Physical Exam  Constitutional: She appears well-developed and well-nourished. No distress.  HENT:  Mouth/Throat: No  oropharyngeal exudate.  Eyes: Conjunctivae are normal. No scleral icterus.  Neck: Neck supple. No JVD present. No thyromegaly present.  Cardiovascular: Normal rate, regular rhythm and normal heart sounds.  Exam reveals no gallop and no friction rub.   No murmur heard. Pulmonary/Chest: Effort normal and breath sounds normal. No respiratory distress. She has no wheezes. She has no rales.  Abdominal: Soft. Bowel sounds are normal. She exhibits no distension. There is no tenderness. There is no rebound and no guarding.  Musculoskeletal: She exhibits no edema.  Lymphadenopathy:    She has no cervical adenopathy.  Skin: She is not diaphoretic.  Vitals reviewed.         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 I believe she has stage I COPD just based on her audible wheezing. She refuses to quit. I will check a fasting lipid panel. Based on her past medical history, her goal LDL cholesterol is less than 70, her goal HDL cholesterol is greater than 45. I will check a hemoglobin A1c. Her goal hemoglobin A1c is less than 6.5. I recommended a diabetic eye exam. She is compliant with her aspirin. Her blood pressure is controlled today. She received her flu shot

## 2015-03-04 NOTE — Addendum Note (Signed)
Addended by: Legrand Rams B on: 03/04/2015 09:34 AM   Modules accepted: Orders

## 2015-03-05 DIAGNOSIS — G4733 Obstructive sleep apnea (adult) (pediatric): Secondary | ICD-10-CM | POA: Diagnosis not present

## 2015-03-05 LAB — MICROALBUMIN, URINE: Microalb, Ur: 1.2 mg/dL (ref <2.0)

## 2015-03-05 LAB — HEMOGLOBIN A1C
Hgb A1c MFr Bld: 6.2 % — ABNORMAL HIGH (ref <5.7)
Mean Plasma Glucose: 131 mg/dL — ABNORMAL HIGH (ref <117)

## 2015-03-06 ENCOUNTER — Encounter: Payer: Self-pay | Admitting: Family Medicine

## 2015-04-05 DIAGNOSIS — G4733 Obstructive sleep apnea (adult) (pediatric): Secondary | ICD-10-CM | POA: Diagnosis not present

## 2015-04-08 DIAGNOSIS — G4733 Obstructive sleep apnea (adult) (pediatric): Secondary | ICD-10-CM | POA: Diagnosis not present

## 2015-05-05 DIAGNOSIS — G4733 Obstructive sleep apnea (adult) (pediatric): Secondary | ICD-10-CM | POA: Diagnosis not present

## 2015-06-05 DIAGNOSIS — G4733 Obstructive sleep apnea (adult) (pediatric): Secondary | ICD-10-CM | POA: Diagnosis not present

## 2015-07-06 DIAGNOSIS — G4733 Obstructive sleep apnea (adult) (pediatric): Secondary | ICD-10-CM | POA: Diagnosis not present

## 2015-07-10 DIAGNOSIS — G4733 Obstructive sleep apnea (adult) (pediatric): Secondary | ICD-10-CM | POA: Diagnosis not present

## 2015-08-22 ENCOUNTER — Other Ambulatory Visit: Payer: Self-pay | Admitting: Family Medicine

## 2015-08-22 NOTE — Telephone Encounter (Signed)
Refill appropriate and filled per protocol. 

## 2015-10-23 ENCOUNTER — Ambulatory Visit (INDEPENDENT_AMBULATORY_CARE_PROVIDER_SITE_OTHER): Payer: Commercial Managed Care - HMO | Admitting: Family Medicine

## 2015-10-23 ENCOUNTER — Encounter: Payer: Self-pay | Admitting: Family Medicine

## 2015-10-23 VITALS — BP 146/80 | HR 78 | Temp 98.0°F | Resp 20 | Ht 68.0 in | Wt 240.0 lb

## 2015-10-23 DIAGNOSIS — E1121 Type 2 diabetes mellitus with diabetic nephropathy: Secondary | ICD-10-CM | POA: Diagnosis not present

## 2015-10-23 DIAGNOSIS — E038 Other specified hypothyroidism: Secondary | ICD-10-CM

## 2015-10-23 DIAGNOSIS — E1169 Type 2 diabetes mellitus with other specified complication: Secondary | ICD-10-CM

## 2015-10-23 DIAGNOSIS — E785 Hyperlipidemia, unspecified: Secondary | ICD-10-CM | POA: Diagnosis not present

## 2015-10-23 DIAGNOSIS — Z1159 Encounter for screening for other viral diseases: Secondary | ICD-10-CM | POA: Diagnosis not present

## 2015-10-23 DIAGNOSIS — E1129 Type 2 diabetes mellitus with other diabetic kidney complication: Secondary | ICD-10-CM | POA: Diagnosis not present

## 2015-10-23 LAB — CBC WITH DIFFERENTIAL/PLATELET
BASOS ABS: 82 {cells}/uL (ref 0–200)
Basophils Relative: 1 %
EOS PCT: 2 %
Eosinophils Absolute: 164 cells/uL (ref 15–500)
HEMATOCRIT: 40 % (ref 35.0–45.0)
HEMOGLOBIN: 13.3 g/dL (ref 12.0–15.0)
LYMPHS ABS: 2378 {cells}/uL (ref 850–3900)
Lymphocytes Relative: 29 %
MCH: 31.7 pg (ref 27.0–33.0)
MCHC: 33.3 g/dL (ref 32.0–36.0)
MCV: 95.2 fL (ref 80.0–100.0)
MPV: 10 fL (ref 7.5–12.5)
Monocytes Absolute: 328 cells/uL (ref 200–950)
Monocytes Relative: 4 %
NEUTROS PCT: 64 %
Neutro Abs: 5248 cells/uL (ref 1500–7800)
Platelets: 209 10*3/uL (ref 140–400)
RBC: 4.2 MIL/uL (ref 3.80–5.10)
RDW: 13.1 % (ref 11.0–15.0)
WBC: 8.2 10*3/uL (ref 3.8–10.8)

## 2015-10-23 LAB — COMPLETE METABOLIC PANEL WITH GFR
ALBUMIN: 4.1 g/dL (ref 3.6–5.1)
ALK PHOS: 84 U/L (ref 33–130)
ALT: 14 U/L (ref 6–29)
AST: 17 U/L (ref 10–35)
BUN: 11 mg/dL (ref 7–25)
CALCIUM: 9.3 mg/dL (ref 8.6–10.4)
CO2: 25 mmol/L (ref 20–31)
Chloride: 102 mmol/L (ref 98–110)
Creat: 0.71 mg/dL (ref 0.50–1.05)
GFR, Est African American: >89 mL/min (ref >=60)
Glucose, Bld: 132 mg/dL — ABNORMAL HIGH (ref 70–99)
POTASSIUM: 4.6 mmol/L (ref 3.5–5.3)
SODIUM: 138 mmol/L (ref 135–146)
Total Bilirubin: 0.4 mg/dL (ref 0.2–1.2)
Total Protein: 6.6 g/dL (ref 6.1–8.1)

## 2015-10-23 LAB — LIPID PANEL
CHOL/HDL RATIO: 2.8 ratio (ref <=5.0)
CHOLESTEROL: 122 mg/dL — AB (ref 125–200)
HDL: 44 mg/dL — AB (ref >=46)
LDL Cholesterol: 59 mg/dL (ref <130)
TRIGLYCERIDES: 95 mg/dL (ref <150)
VLDL: 19 mg/dL (ref <30)

## 2015-10-23 LAB — HEMOGLOBIN A1C
HEMOGLOBIN A1C: 6 % — AB (ref <5.7)
Mean Plasma Glucose: 126 mg/dL

## 2015-10-23 LAB — TSH: TSH: 0.77 mIU/L

## 2015-10-23 MED ORDER — LOSARTAN POTASSIUM 100 MG PO TABS: 100.0000 mg | ORAL_TABLET | Freq: Every day | ORAL | Status: DC

## 2015-10-23 MED ORDER — ROSUVASTATIN CALCIUM 20 MG PO TABS: 20.0000 mg | ORAL_TABLET | Freq: Every day | ORAL | Status: DC

## 2015-10-23 NOTE — Progress Notes (Signed)
Subjective:    Patient ID: [name], female    DOB: 1960-11-15, 55 y.o.   MRN: [id number]  HPI 09/17/14 Patient is here today for followup of her multiple medical problems. She has history of hyperlipidemia. She is currently on Crestor 20 mg by mouth daily. Due to her history of cardiovascular disease as well as peripheral vascular disease, her goal LDL cholesterol is less than 70. She denies any myalgias or right upper quadrant pain. Unfortunately she continues to smoke. She has no desire to quit smoking at the present time. She is also taking Niaspan as well. Her cardiologist recommended she stop niaspan and reduce aspirin to 81 mg poqday.  Her biggest problem with niaspan is flushing. Her blood pressure is currently well controlled. She denies any chest pain short of breath or dyspnea on exertion. She is taking her beta blocker as well as her angiotensin receptor blocker for secondary prevention of cardiovascular disease. She is also currently taking metformin 500 mg by mouth daily for diabetes mellitus type 2. She denies polyuria, polydipsia, or blurred vision.  At that time, my plan was: Due to her peripheral vascular disease her history of heart disease, her diabetes, I strongly recommended the patient quit smoking. She has no desire to quit smoking at the present time. Her blood pressure is adequately controlled I did recommend that she monitor her blood pressure more closely at home and call me if her blood pressures consistently greater than 867 systolic. I will check a hemoglobin A1c as well as a urine microalbumin. I will also check a fasting lipid panel. Goal LDL cholesterol is less than 70. I told the patient I am okay with her discontinuing Niaspan if the flushing is problematic. She would like to stop that medicine at the present time.  03/04/15 Patient continues to smoke. She has no desire to quit. She is audibly wheezing today on examination. She denies any chest pain. She denies  any angina. She denies any claudication in her legs although she is extremely sedentary. She does not exercise at all. Diabetic foot exam is performed today. She has weakly palpable dorsalis pedis pulses bilaterally. She has known peripheral vascular disease with depressed ABIs on arterial Dopplers performed in 2014. She is compliant with aspirin. She is compliant with Crestor. She denies any myalgias or right upper quadrant pain. She is not regularly checking her sugars are low the last 3 mornings, her sugars have been ranging 120-140. She denies any polyuria, polydipsia, or blurred vision. She is due today for her flu shot. She is also due for diabetic eye exam although she has not scheduled this yet.  At that time, my plan was: I again strongly strongly encouraged the patient to quit smoking. I believe she has stage I COPD just based on her audible wheezing. She refuses to quit. I will check a fasting lipid panel. Based on her past medical history, her goal LDL cholesterol is less than 70, her goal HDL cholesterol is greater than 45. I will check a hemoglobin A1c. Her goal hemoglobin A1c is less than 6.5. I recommended a diabetic eye exam. She is compliant with her aspirin. Her blood pressure is controlled today. She received her flu shot  10/23/15 Patient is still smoking. She has no desire to quit at the present time. She denies any chest pain shortness of breath or dyspnea on exertion. Unfortunately her oncologist passed away. Regarding her blood sugars, she states that her blood sugars in the morning  are typically between 120 and 130. She seldom checks them later that afternoon but when she does they're typically less than 160 which sound excellent. She denies frequent hypoglycemia. Does happen rarely and occasionally but she easily recognizes it is able to adjust it. Blood pressure today is slightly elevated at 146. She is due for a recheck a TSH. She is also due for hepatitis C screening. She denies any  myalgias or right upper quadrant pain. She does report some numbness and tingling in her feet Past Medical History  Diagnosis Date  . Hypertension   . Diabetes mellitus   . Arthritis     knees  . Blood transfusion 08/2007  . CHF (congestive heart failure) ([place])   . GERD (gastroesophageal reflux disease)   . Hyperlipidemia   . Hypothyroidism   . Coronary artery disease   . Claudication ([place])   . Tobacco abuse    No past surgical history on file. Current Outpatient Prescriptions on File Prior to Visit  Medication Sig Dispense Refill  . ACCU-CHEK AVIVA PLUS test strip USE AS INSTRUCTED 300 each 3  . ACCU-CHEK SOFTCLIX LANCETS lancets USE AS INSTRUCTED 300 each 3  . albuterol (PROVENTIL HFA;VENTOLIN HFA) 108 (90 BASE) MCG/ACT inhaler Inhale 2 puffs into the lungs every 6 (six) hours as needed for wheezing or shortness of breath. 1 Inhaler 0  . Alcohol Swabs (B-D SINGLE USE SWABS REGULAR) PADS USE AS DIRECTED 300 each 3  . aspirin 325 MG tablet Take 325 mg by mouth daily.    . Blood Glucose Calibration (ACCU-CHEK AVIVA) SOLN Use as directed 3 each 3  . Blood Glucose Monitoring Suppl (ACCU-CHEK AVIVA PLUS) W/DEVICE KIT Use as directed 1 kit 0  . carvedilol (COREG) 25 MG tablet Take 1 tablet (25 mg total) by mouth 2 (two) times daily with a meal. 180 tablet 3  . cyclobenzaprine (FLEXERIL) 5 MG tablet Take 5 mg by mouth every 8 (eight) hours as needed.    . furosemide (LASIX) 20 MG tablet Take 1 tablet (20 mg total) by mouth daily. 90 tablet 3  . glipiZIDE (GLUCOTROL) 10 MG tablet TAKE 1 TABLET 2 (TWO) TIMES DAILY BEFORE A MEAL. 180 tablet 1  . levothyroxine (SYNTHROID, LEVOTHROID) 150 MCG tablet TAKE 1 TABLET DAILY BEFORE BREAKFAST. 90 tablet 3  . metFORMIN (GLUCOPHAGE) 500 MG tablet TAKE 1 TABLET DAILY WITH BREAKFAST. 90 tablet 1  . mometasone (ELOCON) 0.1 % lotion Apply topically daily. 60 mL 0  . niacin (NIASPAN) 1000 MG CR tablet Take 2 tablets (2,000 mg total) by mouth at bedtime. 180  tablet 3  . nitroGLYCERIN (NITROSTAT) 0.4 MG SL tablet Place 1 tablet (0.4 mg total) under the tongue every 5 (five) minutes as needed. 100 tablet 3  . potassium chloride (K-DUR,KLOR-CON) 10 MEQ tablet Take 1 tablet (10 mEq total) by mouth daily. 90 tablet 3  . ranitidine (ZANTAC) 300 MG tablet TAKE 1 TABLET AT BEDTIME 90 tablet 3  . traZODone (DESYREL) 50 MG tablet Take 50 mg by mouth at bedtime.      No current facility-administered medications on file prior to visit.   No Known Allergies Social History   Social History  . Marital Status: Legally Separated    Spouse Name: N/A  . Number of Children: N/A  . Years of Education: N/A   Occupational History  . Not on file.   Social History Main Topics  . Smoking status: Current Every Day Smoker -- 0.50 packs/day for 32 years  Types: Cigarettes  . Smokeless tobacco: Never Used  . Alcohol Use: No  . Drug Use: No  . Sexual Activity: Not on file   Other Topics Concern  . Not on file   Social History Narrative     Review of Systems  All other systems reviewed and are negative.      Objective:   Physical Exam  Constitutional: She appears well-developed and well-nourished. No distress.  HENT:  Mouth/Throat: No oropharyngeal exudate.  Eyes: Conjunctivae are normal. No scleral icterus.  Neck: Neck supple. No JVD present. No thyromegaly present.  Cardiovascular: Normal rate, regular rhythm and normal heart sounds.  Exam reveals no gallop and no friction rub.   No murmur heard. Pulmonary/Chest: Effort normal and breath sounds normal. No respiratory distress. She has no wheezes. She has no rales.  Abdominal: Soft. Bowel sounds are normal. She exhibits no distension. There is no tenderness. There is no rebound and no guarding.  Musculoskeletal: She exhibits no edema.  Lymphadenopathy:    She has no cervical adenopathy.  Skin: She is not diaphoretic.  Vitals reviewed.         Assessment & Plan:  Controlled type 2  diabetes mellitus with diabetic nephropathy, without long-term current use of insulin ([place]) - Plan: CBC with Differential/Platelet, COMPLETE METABOLIC PANEL WITH GFR, Lipid panel, Microalbumin, urine, Hemoglobin A1c, rosuvastatin (CRESTOR) 20 MG tablet  HLD (hyperlipidemia) - Plan: rosuvastatin (CRESTOR) 20 MG tablet  Other specified hypothyroidism - Plan: TSH  Need for hepatitis C screening test - Plan: Hepatitis C Ab Reflex HCV RNA, QUANT Controlled type 2 diabetes mellitus with diabetic nephropathy, without long-term current use of insulin ([place]) - Plan: CBC with Differential/Platelet, COMPLETE METABOLIC PANEL WITH GFR, Lipid panel, Microalbumin, urine, Hemoglobin A1c, rosuvastatin (CRESTOR) 20 MG tablet  HLD (hyperlipidemia) - Plan: rosuvastatin (CRESTOR) 20 MG tablet  Other specified hypothyroidism - Plan: TSH  Need for hepatitis C screening test - Plan: Hepatitis C Ab Reflex HCV RNA, QUANT Patient is due for hepatitis C screening. I will complete that today. Blood pressures elevated and I will increase losartan to 100 mg by mouth daily. Check a hemoglobin A1c. Goal hemoglobin A1c is less than 6.5. I'll also check a urine microalbumin. Regarding her hypothyroidism I'll check a TSH. I will check a fasting lipid panel. Goal LDL cholesterol is less than 70 given her history of ASCVD. She is compliant with an aspirin. Diabetic foot exam is performed today. Recommended diabetic eye exam annually

## 2015-10-24 LAB — MICROALBUMIN, URINE: MICROALB UR: 0.6 mg/dL

## 2015-10-24 LAB — HEPATITIS C ANTIBODY: HCV Ab: NEGATIVE

## 2015-12-04 ENCOUNTER — Other Ambulatory Visit: Payer: Self-pay | Admitting: Family Medicine

## 2015-12-04 NOTE — Telephone Encounter (Signed)
Refill appropriate and filled per protocol. 

## 2016-02-12 ENCOUNTER — Other Ambulatory Visit: Payer: Self-pay | Admitting: Family Medicine

## 2016-03-26 ENCOUNTER — Ambulatory Visit (INDEPENDENT_AMBULATORY_CARE_PROVIDER_SITE_OTHER): Payer: Commercial Managed Care - HMO | Admitting: Family Medicine

## 2016-03-26 ENCOUNTER — Encounter: Payer: Self-pay | Admitting: Family Medicine

## 2016-03-26 ENCOUNTER — Ambulatory Visit (HOSPITAL_COMMUNITY)
Admission: RE | Admit: 2016-03-26 | Discharge: 2016-03-26 | Disposition: A | Discharge status: Home / Self Care | Payer: Commercial Managed Care - HMO | Source: Ambulatory Visit | Attending: Family Medicine | Admitting: Family Medicine

## 2016-03-26 VITALS — BP 150/84 | HR 68 | Temp 97.9°F | Resp 18 | Wt 251.0 lb

## 2016-03-26 DIAGNOSIS — Z23 Encounter for immunization: Secondary | ICD-10-CM

## 2016-03-26 DIAGNOSIS — E119 Type 2 diabetes mellitus without complications: Secondary | ICD-10-CM

## 2016-03-26 DIAGNOSIS — I517 Cardiomegaly: Secondary | ICD-10-CM | POA: Diagnosis not present

## 2016-03-26 DIAGNOSIS — E7800 Pure hypercholesterolemia, unspecified: Secondary | ICD-10-CM

## 2016-03-26 DIAGNOSIS — R918 Other nonspecific abnormal finding of lung field: Secondary | ICD-10-CM | POA: Insufficient documentation

## 2016-03-26 DIAGNOSIS — R0989 Other specified symptoms and signs involving the circulatory and respiratory systems: Secondary | ICD-10-CM | POA: Insufficient documentation

## 2016-03-26 LAB — CBC WITH DIFFERENTIAL/PLATELET
BASOS ABS: 90 {cells}/uL (ref 0–200)
Basophils Relative: 1 %
EOS PCT: 2 %
Eosinophils Absolute: 180 cells/uL (ref 15–500)
HEMATOCRIT: 38.9 % (ref 35.0–45.0)
HEMOGLOBIN: 13.2 g/dL (ref 12.0–15.0)
LYMPHS ABS: 2790 {cells}/uL (ref 850–3900)
LYMPHS PCT: 31 %
MCH: 32.7 pg (ref 27.0–33.0)
MCHC: 33.9 g/dL (ref 32.0–36.0)
MCV: 96.3 fL (ref 80.0–100.0)
MPV: 10.3 fL (ref 7.5–12.5)
Monocytes Absolute: 540 cells/uL (ref 200–950)
Monocytes Relative: 6 %
NEUTROS PCT: 60 %
Neutro Abs: 5400 cells/uL (ref 1500–7800)
Platelets: 212 10*3/uL (ref 140–400)
RBC: 4.04 MIL/uL (ref 3.80–5.10)
RDW: 13.7 % (ref 11.0–15.0)
WBC: 9 10*3/uL (ref 3.8–10.8)

## 2016-03-26 LAB — COMPLETE METABOLIC PANEL WITH GFR
ALBUMIN: 4 g/dL (ref 3.6–5.1)
ALK PHOS: 77 U/L (ref 33–130)
ALT: 13 U/L (ref 6–29)
AST: 16 U/L (ref 10–35)
BUN: 11 mg/dL (ref 7–25)
CALCIUM: 9.2 mg/dL (ref 8.6–10.4)
CO2: 26 mmol/L (ref 20–31)
Chloride: 104 mmol/L (ref 98–110)
Creat: 0.71 mg/dL (ref 0.50–1.05)
GFR, Est African American: >89 mL/min (ref >=60)
Glucose, Bld: 116 mg/dL — ABNORMAL HIGH (ref 70–99)
POTASSIUM: 4.9 mmol/L (ref 3.5–5.3)
SODIUM: 140 mmol/L (ref 135–146)
Total Bilirubin: 0.4 mg/dL (ref 0.2–1.2)
Total Protein: 6.6 g/dL (ref 6.1–8.1)

## 2016-03-26 LAB — LIPID PANEL
CHOL/HDL RATIO: 3.1 ratio (ref <=5.0)
CHOLESTEROL: 119 mg/dL — AB (ref 125–200)
HDL: 38 mg/dL — AB (ref >=46)
LDL Cholesterol: 56 mg/dL (ref <130)
Triglycerides: 127 mg/dL (ref <150)
VLDL: 25 mg/dL (ref <30)

## 2016-03-26 NOTE — Progress Notes (Signed)
Subjective:    Patient ID: [name], female    DOB: Sep 08, 1960, 55 y.o.   MRN: [id number]  HPI4/19/16 Patient is here today for followup of her multiple medical problems. She has history of hyperlipidemia. She is currently on Crestor 20 mg by mouth daily. Due to her history of cardiovascular disease as well as peripheral vascular disease, her goal LDL cholesterol is less than 70. She denies any myalgias or right upper quadrant pain. Unfortunately she continues to smoke. She has no desire to quit smoking at the present time. She is also taking Niaspan as well. Her cardiologist recommended she stop niaspan and reduce aspirin to 81 mg poqday.  Her biggest problem with niaspan is flushing. Her blood pressure is currently well controlled. She denies any chest pain short of breath or dyspnea on exertion. She is taking her beta blocker as well as her angiotensin receptor blocker for secondary prevention of cardiovascular disease. She is also currently taking metformin 500 mg by mouth daily for diabetes mellitus type 2. She denies polyuria, polydipsia, or blurred vision.  At that time, my plan was: Due to her peripheral vascular disease her history of heart disease, her diabetes, I strongly recommended the patient quit smoking. She has no desire to quit smoking at the present time. Her blood pressure is adequately controlled I did recommend that she monitor her blood pressure more closely at home and call me if her blood pressures consistently greater than 027 systolic. I will check a hemoglobin A1c as well as a urine microalbumin. I will also check a fasting lipid panel. Goal LDL cholesterol is less than 70. I told the patient I am okay with her discontinuing Niaspan if the flushing is problematic. She would like to stop that medicine at the present time.  03/04/15 Patient continues to smoke. She has no desire to quit. She is audibly wheezing today on examination. She denies any chest pain. She denies  any angina. She denies any claudication in her legs although she is extremely sedentary. She does not exercise at all. Diabetic foot exam is performed today. She has weakly palpable dorsalis pedis pulses bilaterally. She has known peripheral vascular disease with depressed ABIs on arterial Dopplers performed in 2014. She is compliant with aspirin. She is compliant with Crestor. She denies any myalgias or right upper quadrant pain. She is not regularly checking her sugars are low the last 3 mornings, her sugars have been ranging 120-140. She denies any polyuria, polydipsia, or blurred vision. She is due today for her flu shot. She is also due for diabetic eye exam although she has not scheduled this yet.  At that time, my plan was: I again strongly strongly encouraged the patient to quit smoking. I believe she has stage I COPD just based on her audible wheezing. She refuses to quit. I will check a fasting lipid panel. Based on her past medical history, her goal LDL cholesterol is less than 70, her goal HDL cholesterol is greater than 45. I will check a hemoglobin A1c. Her goal hemoglobin A1c is less than 6.5. I recommended a diabetic eye exam. She is compliant with her aspirin. Her blood pressure is controlled today. She received her flu shot  10/23/15 Patient is still smoking. She has no desire to quit at the present time. She denies any chest pain shortness of breath or dyspnea on exertion. Unfortunately her oncologist passed away. Regarding her blood sugars, she states that her blood sugars in the morning are  typically between 120 and 130. She seldom checks them later that afternoon but when she does they're typically less than 160 which sound excellent. She denies frequent hypoglycemia. Does happen rarely and occasionally but she easily recognizes it is able to adjust it. Blood pressure today is slightly elevated at 146. She is due for a recheck a TSH. She is also due for hepatitis C screening. She denies any  myalgias or right upper quadrant pain. She does report some numbness and tingling in her feet.  At that time, my plan was: Patient is due for hepatitis C screening. I will complete that today. Blood pressures elevated and I will increase losartan to 100 mg by mouth daily. Check a hemoglobin A1c. Goal hemoglobin A1c is less than 6.5. I'll also check a urine microalbumin. Regarding her hypothyroidism I'll check a TSH. I will check a fasting lipid panel. Goal LDL cholesterol is less than 70 given her history of ASCVD. She is compliant with an aspirin. Diabetic foot exam is performed today. Recommended diabetic eye exam annually   03/26/16 Patient is here today for follow-up. She denies any complaints but while I'm standing in the hallway reviewing her chart I can hear the patient coughed terribly. On examination today she has faint bibasilar crackles right greater than left. There is no pitting edema in her legs. She does smoke she denies any fevers chills or hemoptysis. Her blood pressure today is elevated at 150/84. She denies any chest pain. She denies any angina. She denies any myalgias or right upper quadrant pain. Her last lab work was excellent. In fact her hemoglobin A1c was 6. She does have some symptoms of hypoglycemia. These tend to occur in the mornings. Past Medical History:  Diagnosis Date  . Arthritis    knees  . Blood transfusion 08/2007  . CHF (congestive heart failure) ([place])   . Claudication ([place])   . Coronary artery disease   . Diabetes mellitus   . GERD (gastroesophageal reflux disease)   . Hyperlipidemia   . Hypertension   . Hypothyroidism   . Tobacco abuse    No past surgical history on file. Current Outpatient Prescriptions on File Prior to Visit  Medication Sig Dispense Refill  . ACCU-CHEK AVIVA PLUS test strip USE AS INSTRUCTED 300 each 3  . ACCU-CHEK SOFTCLIX LANCETS lancets USE AS INSTRUCTED 300 each 3  . Alcohol Swabs (B-D SINGLE USE SWABS REGULAR) PADS USE AS  DIRECTED 300 each 3  . aspirin 325 MG tablet Take 325 mg by mouth daily.    . Blood Glucose Calibration (ACCU-CHEK AVIVA) SOLN Use as directed 3 each 3  . Blood Glucose Monitoring Suppl (ACCU-CHEK AVIVA PLUS) W/DEVICE KIT Use as directed 1 kit 0  . carvedilol (COREG) 25 MG tablet Take 1 tablet (25 mg total) by mouth 2 (two) times daily with a meal. 180 tablet 3  . furosemide (LASIX) 20 MG tablet TAKE 1 TABLET EVERY DAY 90 tablet 3  . glipiZIDE (GLUCOTROL) 10 MG tablet TAKE 1 TABLET 2 TIMES DAILY BEFORE A MEAL. 180 tablet 3  . levothyroxine (SYNTHROID, LEVOTHROID) 150 MCG tablet TAKE 1 TABLET DAILY BEFORE BREAKFAST 90 tablet 3  . losartan (COZAAR) 100 MG tablet Take 1 tablet (100 mg total) by mouth daily. 90 tablet 3  . metFORMIN (GLUCOPHAGE) 500 MG tablet TAKE 1 TABLET DAILY WITH BREAKFAST. 90 tablet 1  . niacin (NIASPAN) 1000 MG CR tablet Take 2 tablets (2,000 mg total) by mouth at bedtime. 180 tablet 3  .  potassium chloride (K-DUR,KLOR-CON) 10 MEQ tablet TAKE 1 TABLET EVERY DAY 90 tablet 3  . ranitidine (ZANTAC) 300 MG tablet TAKE 1 TABLET AT BEDTIME 90 tablet 3  . rosuvastatin (CRESTOR) 20 MG tablet Take 1 tablet (20 mg total) by mouth daily. 90 tablet 3  . traZODone (DESYREL) 50 MG tablet Take 50 mg by mouth at bedtime.     Marland Kitchen albuterol (PROVENTIL HFA;VENTOLIN HFA) 108 (90 BASE) MCG/ACT inhaler Inhale 2 puffs into the lungs every 6 (six) hours as needed for wheezing or shortness of breath. (Patient not taking: Reported on 03/26/2016) 1 Inhaler 0  . cyclobenzaprine (FLEXERIL) 5 MG tablet Take 5 mg by mouth every 8 (eight) hours as needed.    . mometasone (ELOCON) 0.1 % lotion Apply topically daily. (Patient not taking: Reported on 03/26/2016) 60 mL 0  . nitroGLYCERIN (NITROSTAT) 0.4 MG SL tablet Place 1 tablet (0.4 mg total) under the tongue every 5 (five) minutes as needed. (Patient not taking: Reported on 03/26/2016) 100 tablet 3   No current facility-administered medications on file prior to  visit.    No Known Allergies Social History   Social History  . Marital status: Legally Separated    Spouse name: N/A  . Number of children: N/A  . Years of education: N/A   Occupational History  . Not on file.   Social History Main Topics  . Smoking status: Current Every Day Smoker    Packs/day: 0.50    Years: 32.00    Types: Cigarettes  . Smokeless tobacco: Never Used  . Alcohol use No  . Drug use: No  . Sexual activity: Not on file   Other Topics Concern  . Not on file   Social History Narrative  . No narrative on file     Review of Systems  All other systems reviewed and are negative.      Objective:   Physical Exam  Constitutional: She appears well-developed and well-nourished. No distress.  HENT:  Mouth/Throat: No oropharyngeal exudate.  Eyes: Conjunctivae are normal. No scleral icterus.  Neck: Neck supple. No JVD present. No thyromegaly present.  Cardiovascular: Normal rate, regular rhythm and normal heart sounds.  Exam reveals no gallop and no friction rub.   No murmur heard. Pulmonary/Chest: Effort normal. No respiratory distress. She has no wheezes. She has rales.  Abdominal: Soft. Bowel sounds are normal. She exhibits no distension. There is no tenderness. There is no rebound and no guarding.  Musculoskeletal: She exhibits no edema.  Lymphadenopathy:    She has no cervical adenopathy.  Skin: She is not diaphoretic.  Vitals reviewed.         Assessment & Plan:  Abnormal pulmonary finding - Plan: DG Chest 2 View  Controlled type 2 diabetes mellitus without complication, without long-term current use of insulin ([facility]) - Plan: CBC with Differential/Platelet, COMPLETE METABOLIC PANEL WITH GFR, Lipid panel, Hemoglobin A1c  Flu vaccine need - Plan: Flu Vaccine QUAD 36+ mos IM  Pure hypercholesterolemia Patient received her flu shot today. I will check a hemoglobin A1c. If her hemoglobin A1c is still near 6, I believe we should back down on the  glipizide to avoid hypoglycemia. I believe we can compensate with metformin. This may also help her lose some weight. She is also been eating honey buns every day and I recommended against this. She continues to smoke and has no desire to quit smoking. Her blood pressures too high. I plan to start the patient on amlodipine but I  would like to get the chest x-ray results back first. If there is evidence of pulmonary edema, and set of amlodipine, I would increase her Lasix. I will also check a fasting lipid panel.

## 2016-03-27 LAB — HEMOGLOBIN A1C
Hgb A1c MFr Bld: 5.9 % — ABNORMAL HIGH (ref <5.7)
Mean Plasma Glucose: 123 mg/dL

## 2016-03-29 ENCOUNTER — Other Ambulatory Visit: Payer: Self-pay | Admitting: Family Medicine

## 2016-03-29 MED ORDER — GLIPIZIDE ER 5 MG PO TB24: 5.0000 mg | ORAL_TABLET | Freq: Every day | ORAL | 1 refills | Status: DC

## 2016-04-09 ENCOUNTER — Other Ambulatory Visit: Payer: Self-pay | Admitting: Family Medicine

## 2016-05-18 ENCOUNTER — Other Ambulatory Visit: Payer: Self-pay | Admitting: Family Medicine

## 2016-09-02 ENCOUNTER — Encounter: Payer: Self-pay | Admitting: Family Medicine

## 2016-09-02 ENCOUNTER — Ambulatory Visit (INDEPENDENT_AMBULATORY_CARE_PROVIDER_SITE_OTHER): Payer: Medicare HMO | Admitting: Family Medicine

## 2016-09-02 VITALS — BP 164/88 | HR 72 | Temp 97.5°F | Resp 18 | Ht 66.0 in | Wt 261.0 lb

## 2016-09-02 DIAGNOSIS — E119 Type 2 diabetes mellitus without complications: Secondary | ICD-10-CM | POA: Diagnosis not present

## 2016-09-02 DIAGNOSIS — E7800 Pure hypercholesterolemia, unspecified: Secondary | ICD-10-CM | POA: Diagnosis not present

## 2016-09-02 DIAGNOSIS — I10 Essential (primary) hypertension: Secondary | ICD-10-CM | POA: Diagnosis not present

## 2016-09-02 LAB — COMPLETE METABOLIC PANEL WITH GFR
ALT: 15 U/L (ref 6–29)
AST: 19 U/L (ref 10–35)
Albumin: 4.2 g/dL (ref 3.6–5.1)
Alkaline Phosphatase: 88 U/L (ref 33–130)
BUN: 11 mg/dL (ref 7–25)
CALCIUM: 9.3 mg/dL (ref 8.6–10.4)
CHLORIDE: 105 mmol/L (ref 98–110)
CO2: 20 mmol/L (ref 20–31)
CREATININE: 0.72 mg/dL (ref 0.50–1.05)
GFR, Est African American: >89 mL/min (ref >=60)
GFR, Est Non African American: >89 mL/min (ref >=60)
Glucose, Bld: 175 mg/dL — ABNORMAL HIGH (ref 70–99)
Potassium: 4.2 mmol/L (ref 3.5–5.3)
Sodium: 138 mmol/L (ref 135–146)
TOTAL PROTEIN: 6.7 g/dL (ref 6.1–8.1)
Total Bilirubin: 0.3 mg/dL (ref 0.2–1.2)

## 2016-09-02 LAB — CBC WITH DIFFERENTIAL/PLATELET
BASOS ABS: 85 {cells}/uL (ref 0–200)
Basophils Relative: 1 %
EOS ABS: 170 {cells}/uL (ref 15–500)
EOS PCT: 2 %
HCT: 40.4 % (ref 35.0–45.0)
Hemoglobin: 13.3 g/dL (ref 12.0–15.0)
LYMPHS PCT: 27 %
Lymphs Abs: 2295 cells/uL (ref 850–3900)
MCH: 32 pg (ref 27.0–33.0)
MCHC: 32.9 g/dL (ref 32.0–36.0)
MCV: 97.1 fL (ref 80.0–100.0)
MONOS PCT: 5 %
MPV: 10.5 fL (ref 7.5–12.5)
Monocytes Absolute: 425 cells/uL (ref 200–950)
NEUTROS ABS: 5525 {cells}/uL (ref 1500–7800)
Neutrophils Relative %: 65 %
PLATELETS: 212 10*3/uL (ref 140–400)
RBC: 4.16 MIL/uL (ref 3.80–5.10)
RDW: 13.6 % (ref 11.0–15.0)
WBC: 8.5 10*3/uL (ref 3.8–10.8)

## 2016-09-02 MED ORDER — AMLODIPINE BESYLATE 5 MG PO TABS: 5.0000 mg | ORAL_TABLET | Freq: Every day | ORAL | 3 refills | Status: DC

## 2016-09-02 NOTE — Progress Notes (Signed)
Subjective:    Patient ID: [name], female    DOB: 1961/05/11, 56 y.o.   MRN: [id number]  HPI4/19/16 Patient is here today for followup of her multiple medical problems. She has history of hyperlipidemia. She is currently on Crestor 20 mg by mouth daily. Due to her history of cardiovascular disease as well as peripheral vascular disease, her goal LDL cholesterol is less than 70. She denies any myalgias or right upper quadrant pain. Unfortunately she continues to smoke. She has no desire to quit smoking at the present time. She is also taking Niaspan as well. Her cardiologist recommended she stop niaspan and reduce aspirin to 81 mg poqday.  Her biggest problem with niaspan is flushing. Her blood pressure is currently well controlled. She denies any chest pain short of breath or dyspnea on exertion. She is taking her beta blocker as well as her angiotensin receptor blocker for secondary prevention of cardiovascular disease. She is also currently taking metformin 500 mg by mouth daily for diabetes mellitus type 2. She denies polyuria, polydipsia, or blurred vision.  At that time, my plan was: Due to her peripheral vascular disease her history of heart disease, her diabetes, I strongly recommended the patient quit smoking. She has no desire to quit smoking at the present time. Her blood pressure is adequately controlled I did recommend that she monitor her blood pressure more closely at home and call me if her blood pressures consistently greater than 570 systolic. I will check a hemoglobin A1c as well as a urine microalbumin. I will also check a fasting lipid panel. Goal LDL cholesterol is less than 70. I told the patient I am okay with her discontinuing Niaspan if the flushing is problematic. She would like to stop that medicine at the present time.  03/04/15 Patient continues to smoke. She has no desire to quit. She is audibly wheezing today on examination. She denies any chest pain. She denies  any angina. She denies any claudication in her legs although she is extremely sedentary. She does not exercise at all. Diabetic foot exam is performed today. She has weakly palpable dorsalis pedis pulses bilaterally. She has known peripheral vascular disease with depressed ABIs on arterial Dopplers performed in 2014. She is compliant with aspirin. She is compliant with Crestor. She denies any myalgias or right upper quadrant pain. She is not regularly checking her sugars are low the last 3 mornings, her sugars have been ranging 120-140. She denies any polyuria, polydipsia, or blurred vision. She is due today for her flu shot. She is also due for diabetic eye exam although she has not scheduled this yet.  At that time, my plan was: I again strongly strongly encouraged the patient to quit smoking. I believe she has stage I COPD just based on her audible wheezing. She refuses to quit. I will check a fasting lipid panel. Based on her past medical history, her goal LDL cholesterol is less than 70, her goal HDL cholesterol is greater than 45. I will check a hemoglobin A1c. Her goal hemoglobin A1c is less than 6.5. I recommended a diabetic eye exam. She is compliant with her aspirin. Her blood pressure is controlled today. She received her flu shot  10/23/15 Patient is still smoking. She has no desire to quit at the present time. She denies any chest pain shortness of breath or dyspnea on exertion. Unfortunately her oncologist passed away. Regarding her blood sugars, she states that her blood sugars in the morning are  typically between 120 and 130. She seldom checks them later that afternoon but when she does they're typically less than 160 which sound excellent. She denies frequent hypoglycemia. Does happen rarely and occasionally but she easily recognizes it is able to adjust it. Blood pressure today is slightly elevated at 146. She is due for a recheck a TSH. She is also due for hepatitis C screening. She denies any  myalgias or right upper quadrant pain. She does report some numbness and tingling in her feet.  At that time, my plan was: Patient is due for hepatitis C screening. I will complete that today. Blood pressures elevated and I will increase losartan to 100 mg by mouth daily. Check a hemoglobin A1c. Goal hemoglobin A1c is less than 6.5. I'll also check a urine microalbumin. Regarding her hypothyroidism I'll check a TSH. I will check a fasting lipid panel. Goal LDL cholesterol is less than 70 given her history of ASCVD. She is compliant with an aspirin. Diabetic foot exam is performed today. Recommended diabetic eye exam annually   03/26/16 Patient is here today for follow-up. She denies any complaints but while I'm standing in the hallway reviewing her chart I can hear the patient coughed terribly. On examination today she has faint bibasilar crackles right greater than left. There is no pitting edema in her legs. She does smoke she denies any fevers chills or hemoptysis. Her blood pressure today is elevated at 150/84. She denies any chest pain. She denies any angina. She denies any myalgias or right upper quadrant pain. Her last lab work was excellent. In fact her hemoglobin A1c was 6. She does have some symptoms of hypoglycemia. These tend to occur in the mornings.  At that time, my plan was: Patient received her flu shot today. I will check a hemoglobin A1c. If her hemoglobin A1c is still near 6, I believe we should back down on the glipizide to avoid hypoglycemia. I believe we can compensate with metformin. This may also help her lose some weight. She is also been eating honey buns every day and I recommended against this. She continues to smoke and has no desire to quit smoking. Her blood pressures too high. I plan to start the patient on amlodipine but I would like to get the chest x-ray results back first. If there is evidence of pulmonary edema, and set of amlodipine, I would increase her Lasix. I will  also check a fasting lipid panel.  09/02/16 Patient continues to smoke. Her blood pressures elevated today at 164/88. She denies any chest pain shortness of breath or dyspnea on exertion. At her last visit, her hemoglobin A1c was outstanding at 5.9 and therefore I decreased her dose of glipizide. Since doing so, her fasting blood sugars range between 101 150. Her two-hour postprandial sugars range between 100-180. She denies any hypoglycemia. She denies any polyuria polydipsia or blurry vision. She denies any myalgias or right upper quadrant pain Past Medical History:  Diagnosis Date  . Arthritis    knees  . Blood transfusion 08/2007  . CHF (congestive heart failure) ([place])   . Claudication ([place])   . Coronary artery disease   . Diabetes mellitus   . GERD (gastroesophageal reflux disease)   . Hyperlipidemia   . Hypertension   . Hypothyroidism   . Tobacco abuse    No past surgical history on file. Current Outpatient Prescriptions on File Prior to Visit  Medication Sig Dispense Refill  . ACCU-CHEK AVIVA PLUS test strip USE  AS INSTRUCTED 300 each 3  . ACCU-CHEK SOFTCLIX LANCETS lancets USE AS INSTRUCTED 300 each 3  . Alcohol Swabs (B-D SINGLE USE SWABS REGULAR) PADS USE AS DIRECTED 300 each 3  . aspirin 325 MG tablet Take 325 mg by mouth daily.    . Blood Glucose Calibration (ACCU-CHEK AVIVA) SOLN Use as directed 3 each 3  . Blood Glucose Monitoring Suppl (ACCU-CHEK AVIVA PLUS) W/DEVICE KIT Use as directed 1 kit 0  . carvedilol (COREG) 25 MG tablet TAKE 1 TABLET TWICE DAILY WITH MEALS 180 tablet 3  . furosemide (LASIX) 20 MG tablet TAKE 1 TABLET EVERY DAY 90 tablet 3  . glipiZIDE (GLIPIZIDE XL) 5 MG 24 hr tablet Take 1 tablet (5 mg total) by mouth daily with breakfast. 90 tablet 1  . levothyroxine (SYNTHROID, LEVOTHROID) 150 MCG tablet TAKE 1 TABLET DAILY BEFORE BREAKFAST 90 tablet 3  . losartan (COZAAR) 100 MG tablet Take 1 tablet (100 mg total) by mouth daily. 90 tablet 3  . metFORMIN  (GLUCOPHAGE) 500 MG tablet TAKE 1 TABLET DAILY WITH BREAKFAST. 90 tablet 1  . niacin (NIASPAN) 1000 MG CR tablet TAKE 2 TABLETS AT BEDTIME 180 tablet 3  . potassium chloride (K-DUR,KLOR-CON) 10 MEQ tablet TAKE 1 TABLET EVERY DAY 90 tablet 3  . ranitidine (ZANTAC) 300 MG tablet TAKE 1 TABLET AT BEDTIME 90 tablet 3  . rosuvastatin (CRESTOR) 20 MG tablet Take 1 tablet (20 mg total) by mouth daily. 90 tablet 3  . nitroGLYCERIN (NITROSTAT) 0.4 MG SL tablet Place 1 tablet (0.4 mg total) under the tongue every 5 (five) minutes as needed. (Patient not taking: Reported on 03/26/2016) 100 tablet 3   No current facility-administered medications on file prior to visit.    No Known Allergies Social History   Social History  . Marital status: Legally Separated    Spouse name: N/A  . Number of children: N/A  . Years of education: N/A   Occupational History  . Not on file.   Social History Main Topics  . Smoking status: Current Every Day Smoker    Packs/day: 0.50    Years: 32.00    Types: Cigarettes  . Smokeless tobacco: Never Used  . Alcohol use No  . Drug use: No  . Sexual activity: Not on file   Other Topics Concern  . Not on file   Social History Narrative  . No narrative on file     Review of Systems  All other systems reviewed and are negative.      Objective:   Physical Exam  Constitutional: She appears well-developed and well-nourished. No distress.  HENT:  Mouth/Throat: No oropharyngeal exudate.  Eyes: Conjunctivae are normal. No scleral icterus.  Neck: Neck supple. No JVD present. No thyromegaly present.  Cardiovascular: Normal rate, regular rhythm and normal heart sounds.  Exam reveals no gallop and no friction rub.   No murmur heard. Pulmonary/Chest: Effort normal. No respiratory distress. She has no wheezes. She has rales.  Abdominal: Soft. Bowel sounds are normal. She exhibits no distension. There is no tenderness. There is no rebound and no guarding.    Musculoskeletal: She exhibits no edema.  Lymphadenopathy:    She has no cervical adenopathy.  Skin: She is not diaphoretic.  Vitals reviewed.         Assessment & Plan:  Controlled type 2 diabetes mellitus without complication, without long-term current use of insulin ([place]) - Plan: CBC with Differential/Platelet, COMPLETE METABOLIC PANEL WITH GFR, Hemoglobin A1c, Microalbumin, urine  Pure hypercholesterolemia  Essential hypertension Blood pressure is not well controlled. Add amlodipine 5 mg a day and recheck blood pressure in one month. Recheck hemoglobin A1c. If hemoglobin A1c is still well controlled, I will wean off glipizide and increase metformin to 1000 mg by mouth twice a day. Check fasting lipid panel. Continue to encourage the patient quit smoking.

## 2016-09-03 ENCOUNTER — Other Ambulatory Visit: Payer: Self-pay

## 2016-09-03 LAB — MICROALBUMIN, URINE: MICROALB UR: 3 mg/dL

## 2016-09-03 LAB — HEMOGLOBIN A1C
HEMOGLOBIN A1C: 6.5 % — AB (ref <5.7)
Mean Plasma Glucose: 140 mg/dL

## 2016-09-03 MED ORDER — METFORMIN HCL 1000 MG PO TABS: 1000.0000 mg | ORAL_TABLET | Freq: Two times a day (BID) | ORAL | 3 refills | Status: DC

## 2016-09-20 ENCOUNTER — Other Ambulatory Visit: Payer: Self-pay | Admitting: Family Medicine

## 2016-09-20 MED ORDER — ACCU-CHEK AVIVA VI SOLN: 3 refills | Status: DC

## 2016-09-20 MED ORDER — ACCU-CHEK AVIVA PLUS W/DEVICE KIT
PACK | 0 refills | Status: DC
Start: 2016-09-20 — End: 2023-10-20

## 2016-09-20 MED ORDER — ACCU-CHEK SOFTCLIX LANCETS MISC: 3 refills | Status: DC

## 2016-09-20 MED ORDER — GLUCOSE BLOOD VI STRP: ORAL_STRIP | 3 refills | Status: DC

## 2016-09-20 MED ORDER — BD SWAB SINGLE USE REGULAR PADS: MEDICATED_PAD | 3 refills | Status: DC

## 2016-11-04 ENCOUNTER — Telehealth: Payer: Self-pay | Admitting: Family Medicine

## 2016-11-04 MED ORDER — AMLODIPINE BESYLATE 5 MG PO TABS: 5.0000 mg | ORAL_TABLET | Freq: Every day | ORAL | 3 refills | Status: DC

## 2016-11-04 MED ORDER — METFORMIN HCL 1000 MG PO TABS: 1000.0000 mg | ORAL_TABLET | Freq: Two times a day (BID) | ORAL | 3 refills | Status: DC

## 2016-11-04 NOTE — Telephone Encounter (Signed)
Pt needs amlodipine and metformin sent to [facility] mail order [facility] [phone].

## 2016-11-04 NOTE — Telephone Encounter (Signed)
Medication called/sent to requested pharmacy  

## 2016-11-11 ENCOUNTER — Other Ambulatory Visit: Payer: Self-pay | Admitting: Family Medicine

## 2016-12-23 ENCOUNTER — Telehealth: Payer: Self-pay | Admitting: Family Medicine

## 2016-12-23 MED ORDER — RANITIDINE HCL 300 MG PO TABS: 300.0000 mg | ORAL_TABLET | Freq: Every day | ORAL | 0 refills | Status: DC

## 2016-12-23 MED ORDER — ROSUVASTATIN CALCIUM 20 MG PO TABS: 20.0000 mg | ORAL_TABLET | Freq: Every day | ORAL | 0 refills | Status: DC

## 2016-12-23 NOTE — Telephone Encounter (Signed)
Medication called/sent to requested pharmacy and pt will require ov and labs before further refills. 

## 2016-12-23 NOTE — Telephone Encounter (Signed)
Pt needs refill on Crestor, and zantac sent to [facility] in [place].

## 2016-12-24 ENCOUNTER — Other Ambulatory Visit: Payer: Self-pay | Admitting: Family Medicine

## 2016-12-27 MED ORDER — ROSUVASTATIN CALCIUM 20 MG PO TABS: 20.0000 mg | ORAL_TABLET | Freq: Every day | ORAL | 0 refills | Status: DC

## 2016-12-27 MED ORDER — RANITIDINE HCL 300 MG PO TABS: 300.0000 mg | ORAL_TABLET | Freq: Every day | ORAL | 0 refills | Status: DC

## 2017-02-15 ENCOUNTER — Other Ambulatory Visit: Payer: Self-pay | Admitting: Family Medicine

## 2017-02-23 ENCOUNTER — Other Ambulatory Visit: Payer: Self-pay | Admitting: Family Medicine

## 2017-02-23 MED ORDER — RANITIDINE HCL 300 MG PO TABS: 300.0000 mg | ORAL_TABLET | Freq: Every day | ORAL | 0 refills | Status: DC

## 2017-02-23 MED ORDER — ROSUVASTATIN CALCIUM 20 MG PO TABS: 20.0000 mg | ORAL_TABLET | Freq: Every day | ORAL | 0 refills | Status: DC

## 2017-03-03 ENCOUNTER — Encounter: Payer: Self-pay | Admitting: Family Medicine

## 2017-03-03 ENCOUNTER — Ambulatory Visit (INDEPENDENT_AMBULATORY_CARE_PROVIDER_SITE_OTHER): Payer: Medicare HMO | Admitting: Family Medicine

## 2017-03-03 VITALS — BP 118/70 | HR 71 | Temp 97.6°F | Resp 18 | Ht 66.0 in | Wt 253.0 lb

## 2017-03-03 DIAGNOSIS — E039 Hypothyroidism, unspecified: Secondary | ICD-10-CM | POA: Diagnosis not present

## 2017-03-03 DIAGNOSIS — E7800 Pure hypercholesterolemia, unspecified: Secondary | ICD-10-CM

## 2017-03-03 DIAGNOSIS — E119 Type 2 diabetes mellitus without complications: Secondary | ICD-10-CM

## 2017-03-03 DIAGNOSIS — F17200 Nicotine dependence, unspecified, uncomplicated: Secondary | ICD-10-CM

## 2017-03-03 DIAGNOSIS — I10 Essential (primary) hypertension: Secondary | ICD-10-CM

## 2017-03-03 DIAGNOSIS — Z23 Encounter for immunization: Secondary | ICD-10-CM

## 2017-03-03 DIAGNOSIS — IMO0001 Reserved for inherently not codable concepts without codable children: Secondary | ICD-10-CM

## 2017-03-03 NOTE — Addendum Note (Signed)
Addended by: Phineas Semen A on: 03/03/2017 09:37 AM   Modules accepted: Orders

## 2017-03-03 NOTE — Progress Notes (Signed)
Subjective:    Patient ID: [name], female    DOB: 1961/02/06, 56 y.o.   MRN: [id number]  Diabetes   09/17/14 Patient is here today for followup of her multiple medical problems. She has history of hyperlipidemia. She is currently on Crestor 20 mg by mouth daily. Due to her history of cardiovascular disease as well as peripheral vascular disease, her goal LDL cholesterol is less than 70. She denies any myalgias or right upper quadrant pain. Unfortunately she continues to smoke. She has no desire to quit smoking at the present time. She is also taking Niaspan as well. Her cardiologist recommended she stop niaspan and reduce aspirin to 81 mg poqday.  Her biggest problem with niaspan is flushing. Her blood pressure is currently well controlled. She denies any chest pain short of breath or dyspnea on exertion. She is taking her beta blocker as well as her angiotensin receptor blocker for secondary prevention of cardiovascular disease. She is also currently taking metformin 500 mg by mouth daily for diabetes mellitus type 2. She denies polyuria, polydipsia, or blurred vision.  At that time, my plan was: Due to her peripheral vascular disease her history of heart disease, her diabetes, I strongly recommended the patient quit smoking. She has no desire to quit smoking at the present time. Her blood pressure is adequately controlled I did recommend that she monitor her blood pressure more closely at home and call me if her blood pressures consistently greater than 287 systolic. I will check a hemoglobin A1c as well as a urine microalbumin. I will also check a fasting lipid panel. Goal LDL cholesterol is less than 70. I told the patient I am okay with her discontinuing Niaspan if the flushing is problematic. She would like to stop that medicine at the present time.  03/04/15 Patient continues to smoke. She has no desire to quit. She is audibly wheezing today on examination. She denies any chest pain. She  denies any angina. She denies any claudication in her legs although she is extremely sedentary. She does not exercise at all. Diabetic foot exam is performed today. She has weakly palpable dorsalis pedis pulses bilaterally. She has known peripheral vascular disease with depressed ABIs on arterial Dopplers performed in 2014. She is compliant with aspirin. She is compliant with Crestor. She denies any myalgias or right upper quadrant pain. She is not regularly checking her sugars are low the last 3 mornings, her sugars have been ranging 120-140. She denies any polyuria, polydipsia, or blurred vision. She is due today for her flu shot. She is also due for diabetic eye exam although she has not scheduled this yet.  At that time, my plan was: I again strongly strongly encouraged the patient to quit smoking. I believe she has stage I COPD just based on her audible wheezing. She refuses to quit. I will check a fasting lipid panel. Based on her past medical history, her goal LDL cholesterol is less than 70, her goal HDL cholesterol is greater than 45. I will check a hemoglobin A1c. Her goal hemoglobin A1c is less than 6.5. I recommended a diabetic eye exam. She is compliant with her aspirin. Her blood pressure is controlled today. She received her flu shot  10/23/15 Patient is still smoking. She has no desire to quit at the present time. She denies any chest pain shortness of breath or dyspnea on exertion. Unfortunately her oncologist passed away. Regarding her blood sugars, she states that her blood sugars in  the morning are typically between 120 and 130. She seldom checks them later that afternoon but when she does they're typically less than 160 which sound excellent. She denies frequent hypoglycemia. Does happen rarely and occasionally but she easily recognizes it is able to adjust it. Blood pressure today is slightly elevated at 146. She is due for a recheck a TSH. She is also due for hepatitis C screening. She  denies any myalgias or right upper quadrant pain. She does report some numbness and tingling in her feet.  At that time, my plan was: Patient is due for hepatitis C screening. I will complete that today. Blood pressures elevated and I will increase losartan to 100 mg by mouth daily. Check a hemoglobin A1c. Goal hemoglobin A1c is less than 6.5. I'll also check a urine microalbumin. Regarding her hypothyroidism I'll check a TSH. I will check a fasting lipid panel. Goal LDL cholesterol is less than 70 given her history of ASCVD. She is compliant with an aspirin. Diabetic foot exam is performed today. Recommended diabetic eye exam annually   03/26/16 Patient is here today for follow-up. She denies any complaints but while I'm standing in the hallway reviewing her chart I can hear the patient coughed terribly. On examination today she has faint bibasilar crackles right greater than left. There is no pitting edema in her legs. She does smoke she denies any fevers chills or hemoptysis. Her blood pressure today is elevated at 150/84. She denies any chest pain. She denies any angina. She denies any myalgias or right upper quadrant pain. Her last lab work was excellent. In fact her hemoglobin A1c was 6. She does have some symptoms of hypoglycemia. These tend to occur in the mornings.  At that time, my plan was: Patient received her flu shot today. I will check a hemoglobin A1c. If her hemoglobin A1c is still near 6, I believe we should back down on the glipizide to avoid hypoglycemia. I believe we can compensate with metformin. This may also help her lose some weight. She is also been eating honey buns every day and I recommended against this. She continues to smoke and has no desire to quit smoking. Her blood pressures too high. I plan to start the patient on amlodipine but I would like to get the chest x-ray results back first. If there is evidence of pulmonary edema, and set of amlodipine, I would increase her Lasix.  I will also check a fasting lipid panel.  09/02/16 Patient continues to smoke. Her blood pressures elevated today at 164/88. She denies any chest pain shortness of breath or dyspnea on exertion. At her last visit, her hemoglobin A1c was outstanding at 5.9 and therefore I decreased her dose of glipizide. Since doing so, her fasting blood sugars range between 101 150. Her two-hour postprandial sugars range between 100-180. She denies any hypoglycemia. She denies any polyuria polydipsia or blurry vision. She denies any myalgias or right upper quadrant pain.  At that time, my plan was: Blood pressure is not well controlled. Add amlodipine 5 mg a day and recheck blood pressure in one month. Recheck hemoglobin A1c. If hemoglobin A1c is still well controlled, I will wean off glipizide and increase metformin to 1000 mg by mouth twice a day. Check fasting lipid panel. Continue to encourage the patient quit smoking.  03/03/17 Patient is here today for follow-up. She continues to take niacin in addition to Crestor. Her last HDL cholesterol was 38. Although she has dyslipidemia, we discussed  the clinical study showing no benefit in adding niacin to his statin and preventing cardiovascular events. Furthermore she has terrible side effects of the medication including nausea and flushing. She would prefer to stop it. Unfortunately she continues to smoke. Pulse oximetry today is 94% on room air. She has audible wheezing which is mild and a prolonged expiratory phase along with some rhonchorous breath sounds. We had a discussion today again about smoking cessation and I also discussed Chantix with her. However she is pre-contemplative and has no desire to quit smoking at the present time. She is also on levothyroxine for hypothyroidism. She denies any fatigue, hair loss, hot or cold intolerance. She is due to recheck a TSH. At her last visit, her blood sugar was elevated. However since adding amlodipine, her blood pressure is  much better controlled and is today 118/70. She denies any chest pain shortness of breath or dyspnea on exertion. She is also here to check her diabetes. She denies any polyuria, polydipsia, or blurry vision Past Medical History:  Diagnosis Date  . Arthritis    knees  . Blood transfusion 08/2007  . CHF (congestive heart failure) ([place])   . Claudication ([place])   . Coronary artery disease   . Diabetes mellitus   . GERD (gastroesophageal reflux disease)   . Hyperlipidemia   . Hypertension   . Hypothyroidism   . Tobacco abuse    No past surgical history on file. Current Outpatient Prescriptions on File Prior to Visit  Medication Sig Dispense Refill  . ACCU-CHEK SOFTCLIX LANCETS lancets USE AS INSTRUCTED 300 each 3  . Alcohol Swabs (B-D SINGLE USE SWABS REGULAR) PADS USE AS DIRECTED 300 each 3  . amLODipine (NORVASC) 5 MG tablet Take 1 tablet (5 mg total) by mouth daily. 90 tablet 3  . aspirin 325 MG tablet Take 325 mg by mouth daily.    . Blood Glucose Calibration (ACCU-CHEK AVIVA) SOLN Use as directed 3 each 3  . Blood Glucose Monitoring Suppl (ACCU-CHEK AVIVA PLUS) w/Device KIT Use as directed 1 kit 0  . carvedilol (COREG) 25 MG tablet TAKE 1 TABLET TWICE DAILY WITH MEALS 180 tablet 3  . furosemide (LASIX) 20 MG tablet TAKE 1 TABLET EVERY DAY 90 tablet 3  . glucose blood (ACCU-CHEK AVIVA PLUS) test strip USE AS INSTRUCTED 300 each 3  . levothyroxine (SYNTHROID, LEVOTHROID) 150 MCG tablet TAKE 1 TABLET DAILY BEFORE BREAKFAST 90 tablet 3  . losartan (COZAAR) 100 MG tablet TAKE 1 TABLET EVERY DAY 90 tablet 3  . metFORMIN (GLUCOPHAGE) 1000 MG tablet Take 1 tablet (1,000 mg total) by mouth 2 (two) times daily with a meal. 180 tablet 3  . niacin (NIASPAN) 1000 MG CR tablet TAKE 2 TABLETS AT BEDTIME 180 tablet 3  . nitroGLYCERIN (NITROSTAT) 0.4 MG SL tablet Place 1 tablet (0.4 mg total) under the tongue every 5 (five) minutes as needed. 100 tablet 3  . potassium chloride (K-DUR,KLOR-CON) 10 MEQ  tablet TAKE 1 TABLET EVERY DAY 90 tablet 3  . ranitidine (ZANTAC) 300 MG tablet Take 1 tablet (300 mg total) by mouth at bedtime. 90 tablet 0  . rosuvastatin (CRESTOR) 20 MG tablet Take 1 tablet (20 mg total) by mouth daily. 90 tablet 0   No current facility-administered medications on file prior to visit.    No Known Allergies Social History   Social History  . Marital status: Legally Separated    Spouse name: N/A  . Number of children: N/A  . Years of  education: N/A   Occupational History  . Not on file.   Social History Main Topics  . Smoking status: Current Every Day Smoker    Packs/day: 0.50    Years: 32.00    Types: Cigarettes  . Smokeless tobacco: Never Used  . Alcohol use No  . Drug use: No  . Sexual activity: Not on file   Other Topics Concern  . Not on file   Social History Narrative  . No narrative on file     Review of Systems  All other systems reviewed and are negative.      Objective:   Physical Exam  Constitutional: She appears well-developed and well-nourished. No distress.  HENT:  Mouth/Throat: No oropharyngeal exudate.  Eyes: Conjunctivae are normal. No scleral icterus.  Neck: Neck supple. No JVD present. No thyromegaly present.  Cardiovascular: Normal rate, regular rhythm and normal heart sounds.  Exam reveals no gallop and no friction rub.   No murmur heard. Pulmonary/Chest: Effort normal. No respiratory distress. She has no wheezes. She has rales.  Abdominal: Soft. Bowel sounds are normal. She exhibits no distension. There is no tenderness. There is no rebound and no guarding.  Musculoskeletal: She exhibits no edema.  Lymphadenopathy:    She has no cervical adenopathy.  Skin: She is not diaphoretic.  Vitals reviewed.         Assessment & Plan:  Type 2 diabetes mellitus without complication, without long-term current use of insulin ([facility]) - Plan: CBC with Differential/Platelet, COMPLETE METABOLIC PANEL WITH GFR, Lipid panel,  Hemoglobin A1c, Microalbumin, urine  Hypothyroidism, unspecified type - Plan: TSH  Pure hypercholesterolemia  Essential hypertension  Smoking Blood pressure is now well controlled. I will make no additional changes to her antihypertensives. Regarding her hypothyroidism, I will check a TSH to ensure adequate dosage of levothyroxine. I will check a fasting lipid panel. Her goal LDL cholesterol is less than 70. I have suggested that she discontinue niacin due to lack of clinical benefit and due to side effects. Regarding her diabetes, I will check a hemoglobin A1c. Goal hemoglobin A1c is less than 6.5. Clinically she appears to be developing or has developed COPD. She is asymptomatic although she does have diminished oxygen saturations on room air. At the time being she does not require inhalers. I continue to encourage smoking cessation.  She received her flu shot today.

## 2017-03-04 ENCOUNTER — Telehealth: Payer: Self-pay | Admitting: Family Medicine

## 2017-03-04 DIAGNOSIS — Z79899 Other long term (current) drug therapy: Secondary | ICD-10-CM

## 2017-03-04 DIAGNOSIS — E039 Hypothyroidism, unspecified: Secondary | ICD-10-CM

## 2017-03-04 LAB — CBC WITH DIFFERENTIAL/PLATELET
BASOS ABS: 57 {cells}/uL (ref 0–200)
Basophils Relative: 0.6 %
EOS ABS: 209 {cells}/uL (ref 15–500)
Eosinophils Relative: 2.2 %
HCT: 37.3 % (ref 35.0–45.0)
HEMOGLOBIN: 12.8 g/dL (ref 11.7–15.5)
Lymphs Abs: 2242 cells/uL (ref 850–3900)
MCH: 32.8 pg (ref 27.0–33.0)
MCHC: 34.3 g/dL (ref 32.0–36.0)
MCV: 95.6 fL (ref 80.0–100.0)
MONOS PCT: 5.6 %
MPV: 10.6 fL (ref 7.5–12.5)
NEUTROS ABS: 6460 {cells}/uL (ref 1500–7800)
NEUTROS PCT: 68 %
Platelets: 219 10*3/uL (ref 140–400)
RBC: 3.9 10*6/uL (ref 3.80–5.10)
RDW: 12.3 % (ref 11.0–15.0)
TOTAL LYMPHOCYTE: 23.6 %
WBC mixed population: 532 cells/uL (ref 200–950)
WBC: 9.5 10*3/uL (ref 3.8–10.8)

## 2017-03-04 LAB — LIPID PANEL
CHOLESTEROL: 104 mg/dL (ref <200)
HDL: 37 mg/dL — AB (ref >50)
LDL Cholesterol (Calc): 46 mg/dL (calc)
Non-HDL Cholesterol (Calc): 67 mg/dL (calc) (ref <130)
Total CHOL/HDL Ratio: 2.8 (calc) (ref <5.0)
Triglycerides: 124 mg/dL (ref <150)

## 2017-03-04 LAB — COMPLETE METABOLIC PANEL WITH GFR
AG Ratio: 1.6 (calc) (ref 1.0–2.5)
ALBUMIN MSPROF: 4.2 g/dL (ref 3.6–5.1)
ALKALINE PHOSPHATASE (APISO): 82 U/L (ref 33–130)
ALT: 12 U/L (ref 6–29)
AST: 15 U/L (ref 10–35)
BILIRUBIN TOTAL: 0.3 mg/dL (ref 0.2–1.2)
BUN: 14 mg/dL (ref 7–25)
CHLORIDE: 104 mmol/L (ref 98–110)
CO2: 25 mmol/L (ref 20–32)
CREATININE: 0.65 mg/dL (ref 0.50–1.05)
Calcium: 9.4 mg/dL (ref 8.6–10.4)
GFR, Est African American: 116 mL/min/{1.73_m2} (ref >=60)
GFR, Est Non African American: 100 mL/min/{1.73_m2} (ref >=60)
GLUCOSE: 146 mg/dL — AB (ref 65–99)
Globulin: 2.6 g/dL (calc) (ref 1.9–3.7)
Potassium: 4.5 mmol/L (ref 3.5–5.3)
Sodium: 138 mmol/L (ref 135–146)
Total Protein: 6.8 g/dL (ref 6.1–8.1)

## 2017-03-04 LAB — HEMOGLOBIN A1C
EAG (MMOL/L): 7 (calc)
Hgb A1c MFr Bld: 6 % of total Hgb — ABNORMAL HIGH (ref <5.7)
Mean Plasma Glucose: 126 (calc)

## 2017-03-04 LAB — MICROALBUMIN, URINE: MICROALB UR: 2 mg/dL

## 2017-03-04 LAB — TSH: TSH: 0.22 mIU/L — ABNORMAL LOW

## 2017-03-04 MED ORDER — LEVOTHYROXINE SODIUM 137 MCG PO TABS: 137.0000 ug | ORAL_TABLET | Freq: Every day | ORAL | 0 refills | Status: DC

## 2017-03-04 NOTE — Telephone Encounter (Signed)
Pt aware of lab results and provider recommendations.  New dose of Levothyroxine to local pharmacy with 2 month supply, then pt needs repeat lab work

## 2017-03-04 NOTE — Telephone Encounter (Signed)
-----   Message from Donita Brooks, MD sent at 03/04/2017  6:44 AM EDT ----- Labs are excellent except she is on too much levothyroxine.  Decrease levothyroxine from 150 to 137 mcg poqday and recheck tsh in 2 months.

## 2017-04-15 ENCOUNTER — Other Ambulatory Visit: Payer: Self-pay | Admitting: Family Medicine

## 2017-04-15 MED ORDER — POTASSIUM CHLORIDE CRYS ER 10 MEQ PO TBCR: 10.0000 meq | EXTENDED_RELEASE_TABLET | Freq: Every day | ORAL | 3 refills | Status: DC

## 2017-04-15 MED ORDER — ROSUVASTATIN CALCIUM 20 MG PO TABS: 20.0000 mg | ORAL_TABLET | Freq: Every day | ORAL | 1 refills | Status: DC

## 2017-04-15 MED ORDER — RANITIDINE HCL 300 MG PO TABS: 300.0000 mg | ORAL_TABLET | Freq: Every day | ORAL | 3 refills | Status: DC

## 2017-04-15 MED ORDER — CARVEDILOL 25 MG PO TABS: 25.0000 mg | ORAL_TABLET | Freq: Two times a day (BID) | ORAL | 3 refills | Status: DC

## 2017-04-15 NOTE — Telephone Encounter (Signed)
Medication called/sent to requested pharmacy  

## 2017-05-16 ENCOUNTER — Other Ambulatory Visit: Payer: Medicare HMO

## 2017-05-16 DIAGNOSIS — E039 Hypothyroidism, unspecified: Secondary | ICD-10-CM

## 2017-05-16 DIAGNOSIS — Z79899 Other long term (current) drug therapy: Secondary | ICD-10-CM

## 2017-05-16 LAB — TSH: TSH: 5.87 m[IU]/L — AB (ref 0.40–4.50)

## 2017-05-17 ENCOUNTER — Other Ambulatory Visit: Payer: Self-pay | Admitting: Family Medicine

## 2017-05-17 DIAGNOSIS — E039 Hypothyroidism, unspecified: Secondary | ICD-10-CM

## 2017-05-17 MED ORDER — LEVOTHYROXINE SODIUM 137 MCG PO TABS: 137.0000 ug | ORAL_TABLET | Freq: Every day | ORAL | 0 refills | Status: DC

## 2017-05-18 ENCOUNTER — Other Ambulatory Visit: Payer: Self-pay | Admitting: Family Medicine

## 2017-05-18 DIAGNOSIS — E038 Other specified hypothyroidism: Secondary | ICD-10-CM

## 2017-07-14 ENCOUNTER — Other Ambulatory Visit: Payer: Self-pay | Admitting: Family Medicine

## 2017-07-19 ENCOUNTER — Other Ambulatory Visit: Payer: Medicare HMO

## 2017-07-19 DIAGNOSIS — E038 Other specified hypothyroidism: Secondary | ICD-10-CM | POA: Diagnosis not present

## 2017-07-19 LAB — TSH: TSH: 0.47 m[IU]/L (ref 0.40–4.50)

## 2017-07-22 ENCOUNTER — Encounter: Payer: Self-pay | Admitting: Family Medicine

## 2017-09-05 ENCOUNTER — Encounter: Payer: Self-pay | Admitting: Family Medicine

## 2017-09-05 ENCOUNTER — Ambulatory Visit (INDEPENDENT_AMBULATORY_CARE_PROVIDER_SITE_OTHER): Payer: Medicare HMO | Admitting: Family Medicine

## 2017-09-05 VITALS — BP 126/72 | HR 64 | Temp 98.2°F | Resp 18 | Ht 66.0 in | Wt 230.0 lb

## 2017-09-05 DIAGNOSIS — E039 Hypothyroidism, unspecified: Secondary | ICD-10-CM | POA: Diagnosis not present

## 2017-09-05 DIAGNOSIS — Z114 Encounter for screening for human immunodeficiency virus [HIV]: Secondary | ICD-10-CM

## 2017-09-05 DIAGNOSIS — Z1231 Encounter for screening mammogram for malignant neoplasm of breast: Secondary | ICD-10-CM

## 2017-09-05 DIAGNOSIS — E118 Type 2 diabetes mellitus with unspecified complications: Secondary | ICD-10-CM

## 2017-09-05 DIAGNOSIS — I10 Essential (primary) hypertension: Secondary | ICD-10-CM | POA: Diagnosis not present

## 2017-09-05 DIAGNOSIS — E7800 Pure hypercholesterolemia, unspecified: Secondary | ICD-10-CM

## 2017-09-05 DIAGNOSIS — Z1239 Encounter for other screening for malignant neoplasm of breast: Secondary | ICD-10-CM

## 2017-09-05 DIAGNOSIS — F17200 Nicotine dependence, unspecified, uncomplicated: Secondary | ICD-10-CM

## 2017-09-05 DIAGNOSIS — Z23 Encounter for immunization: Secondary | ICD-10-CM

## 2017-09-05 NOTE — Addendum Note (Signed)
Addended by: Legrand RamsWILLIS, Emelin Dascenzo B on: 09/05/2017 11:27 AM   Modules accepted: Orders

## 2017-09-05 NOTE — Progress Notes (Signed)
Subjective:    Patient ID: [name], female    DOB: 1961-03-17, 57 y.o.   MRN: [id number]  Diabetes   09/17/14 Patient is here today for followup of her multiple medical problems. She has history of hyperlipidemia. She is currently on Crestor 20 mg by mouth daily. Due to her history of cardiovascular disease as well as peripheral vascular disease, her goal LDL cholesterol is less than 70. She denies any myalgias or right upper quadrant pain. Unfortunately she continues to smoke. She has no desire to quit smoking at the present time. She is also taking Niaspan as well. Her cardiologist recommended she stop niaspan and reduce aspirin to 81 mg poqday.  Her biggest problem with niaspan is flushing. Her blood pressure is currently well controlled. She denies any chest pain short of breath or dyspnea on exertion. She is taking her beta blocker as well as her angiotensin receptor blocker for secondary prevention of cardiovascular disease. She is also currently taking metformin 500 mg by mouth daily for diabetes mellitus type 2. She denies polyuria, polydipsia, or blurred vision.  At that time, my plan was: Due to her peripheral vascular disease her history of heart disease, her diabetes, I strongly recommended the patient quit smoking. She has no desire to quit smoking at the present time. Her blood pressure is adequately controlled I did recommend that she monitor her blood pressure more closely at home and call me if her blood pressures consistently greater than 867 systolic. I will check a hemoglobin A1c as well as a urine microalbumin. I will also check a fasting lipid panel. Goal LDL cholesterol is less than 70. I told the patient I am okay with her discontinuing Niaspan if the flushing is problematic. She would like to stop that medicine at the present time.  03/04/15 Patient continues to smoke. She has no desire to quit. She is audibly wheezing today on examination. She denies any chest pain. She  denies any angina. She denies any claudication in her legs although she is extremely sedentary. She does not exercise at all. Diabetic foot exam is performed today. She has weakly palpable dorsalis pedis pulses bilaterally. She has known peripheral vascular disease with depressed ABIs on arterial Dopplers performed in 2014. She is compliant with aspirin. She is compliant with Crestor. She denies any myalgias or right upper quadrant pain. She is not regularly checking her sugars are low the last 3 mornings, her sugars have been ranging 120-140. She denies any polyuria, polydipsia, or blurred vision. She is due today for her flu shot. She is also due for diabetic eye exam although she has not scheduled this yet.  At that time, my plan was: I again strongly strongly encouraged the patient to quit smoking. I believe she has stage I COPD just based on her audible wheezing. She refuses to quit. I will check a fasting lipid panel. Based on her past medical history, her goal LDL cholesterol is less than 70, her goal HDL cholesterol is greater than 45. I will check a hemoglobin A1c. Her goal hemoglobin A1c is less than 6.5. I recommended a diabetic eye exam. She is compliant with her aspirin. Her blood pressure is controlled today. She received her flu shot  10/23/15 Patient is still smoking. She has no desire to quit at the present time. She denies any chest pain shortness of breath or dyspnea on exertion. Unfortunately her oncologist passed away. Regarding her blood sugars, she states that her blood sugars in  the morning are typically between 120 and 130. She seldom checks them later that afternoon but when she does they're typically less than 160 which sound excellent. She denies frequent hypoglycemia. Does happen rarely and occasionally but she easily recognizes it is able to adjust it. Blood pressure today is slightly elevated at 146. She is due for a recheck a TSH. She is also due for hepatitis C screening. She  denies any myalgias or right upper quadrant pain. She does report some numbness and tingling in her feet.  At that time, my plan was: Patient is due for hepatitis C screening. I will complete that today. Blood pressures elevated and I will increase losartan to 100 mg by mouth daily. Check a hemoglobin A1c. Goal hemoglobin A1c is less than 6.5. I'll also check a urine microalbumin. Regarding her hypothyroidism I'll check a TSH. I will check a fasting lipid panel. Goal LDL cholesterol is less than 70 given her history of ASCVD. She is compliant with an aspirin. Diabetic foot exam is performed today. Recommended diabetic eye exam annually   03/26/16 Patient is here today for follow-up. She denies any complaints but while I'm standing in the hallway reviewing her chart I can hear the patient coughed terribly. On examination today she has faint bibasilar crackles right greater than left. There is no pitting edema in her legs. She does smoke she denies any fevers chills or hemoptysis. Her blood pressure today is elevated at 150/84. She denies any chest pain. She denies any angina. She denies any myalgias or right upper quadrant pain. Her last lab work was excellent. In fact her hemoglobin A1c was 6. She does have some symptoms of hypoglycemia. These tend to occur in the mornings.  At that time, my plan was: Patient received her flu shot today. I will check a hemoglobin A1c. If her hemoglobin A1c is still near 6, I believe we should back down on the glipizide to avoid hypoglycemia. I believe we can compensate with metformin. This may also help her lose some weight. She is also been eating honey buns every day and I recommended against this. She continues to smoke and has no desire to quit smoking. Her blood pressures too high. I plan to start the patient on amlodipine but I would like to get the chest x-ray results back first. If there is evidence of pulmonary edema, and set of amlodipine, I would increase her Lasix.  I will also check a fasting lipid panel.  09/02/16 Patient continues to smoke. Her blood pressures elevated today at 164/88. She denies any chest pain shortness of breath or dyspnea on exertion. At her last visit, her hemoglobin A1c was outstanding at 5.9 and therefore I decreased her dose of glipizide. Since doing so, her fasting blood sugars range between 101 150. Her two-hour postprandial sugars range between 100-180. She denies any hypoglycemia. She denies any polyuria polydipsia or blurry vision. She denies any myalgias or right upper quadrant pain.  At that time, my plan was: Blood pressure is not well controlled. Add amlodipine 5 mg a day and recheck blood pressure in one month. Recheck hemoglobin A1c. If hemoglobin A1c is still well controlled, I will wean off glipizide and increase metformin to 1000 mg by mouth twice a day. Check fasting lipid panel. Continue to encourage the patient quit smoking.  03/03/17 Patient is here today for follow-up. She continues to take niacin in addition to Crestor. Her last HDL cholesterol was 38. Although she has dyslipidemia, we discussed  the clinical study showing no benefit in adding niacin to his statin and preventing cardiovascular events. Furthermore she has terrible side effects of the medication including nausea and flushing. She would prefer to stop it. Unfortunately she continues to smoke. Pulse oximetry today is 94% on room air. She has audible wheezing which is mild and a prolonged expiratory phase along with some rhonchorous breath sounds. We had a discussion today again about smoking cessation and I also discussed Chantix with her. However she is pre-contemplative and has no desire to quit smoking at the present time. She is also on levothyroxine for hypothyroidism. She denies any fatigue, hair loss, hot or cold intolerance. She is due to recheck a TSH. At her last visit, her blood sugar was elevated. However since adding amlodipine, her blood pressure is  much better controlled and is today 118/70. She denies any chest pain shortness of breath or dyspnea on exertion. She is also here to check her diabetes. She denies any polyuria, polydipsia, or blurry vision.  At that time, my plan was: Blood pressure is now well controlled. I will make no additional changes to her antihypertensives. Regarding her hypothyroidism, I will check a TSH to ensure adequate dosage of levothyroxine. I will check a fasting lipid panel. Her goal LDL cholesterol is less than 70. I have suggested that she discontinue niacin due to lack of clinical benefit and due to side effects. Regarding her diabetes, I will check a hemoglobin A1c. Goal hemoglobin A1c is less than 6.5. Clinically she appears to be developing or has developed COPD. She is asymptomatic although she does have diminished oxygen saturations on room air. At the time being she does not require inhalers. I continue to encourage smoking cessation.  She received her flu shot today.  09/05/17 Patient is here today for follow-up.  Unfortunately she continues to smoke and has no desire to quit.  Her review of systems however is negative.  She denies any chest pain.  She denies any shortness of breath.  She denies any dyspnea on exertion.  She denies any claudication.  Diabetic foot exam is performed today and is significant for diminished pulses bilaterally however she denies any symptoms of claudication with this.  Unfortunately she is relatively sedentary.  She is overdue for a mammogram.  She is overdue for HIV screening.  She is overdue for a Pap smear.  She is also overdue for diabetic eye exam.  She declines a Pap smear today.  She does not want me to schedule her to see an eye doctor.  She would rather schedule this herself.  She will consent to an HIV test as well as a mammogram. Past Medical History:  Diagnosis Date  . Arthritis    knees  . Blood transfusion 08/2007  . CHF (congestive heart failure) ([place])   . Claudication  ([place])   . Coronary artery disease   . Diabetes mellitus   . GERD (gastroesophageal reflux disease)   . Hyperlipidemia   . Hypertension   . Hypothyroidism   . Tobacco abuse    No past surgical history on file. Current Outpatient Medications on File Prior to Visit  Medication Sig Dispense Refill  . ACCU-CHEK SOFTCLIX LANCETS lancets USE AS INSTRUCTED 300 each 3  . Alcohol Swabs (B-D SINGLE USE SWABS REGULAR) PADS USE AS DIRECTED 300 each 3  . amLODipine (NORVASC) 5 MG tablet Take 1 tablet (5 mg total) by mouth daily. 90 tablet 3  . aspirin 325 MG tablet Take  325 mg by mouth daily.    . Blood Glucose Calibration (ACCU-CHEK AVIVA) SOLN Use as directed 3 each 3  . Blood Glucose Monitoring Suppl (ACCU-CHEK AVIVA PLUS) w/Device KIT Use as directed 1 kit 0  . carvedilol (COREG) 25 MG tablet Take 1 tablet (25 mg total) 2 (two) times daily with a meal by mouth. 180 tablet 3  . furosemide (LASIX) 20 MG tablet TAKE 1 TABLET EVERY DAY 90 tablet 3  . glucose blood (ACCU-CHEK AVIVA PLUS) test strip USE AS INSTRUCTED 300 each 3  . levothyroxine (SYNTHROID, LEVOTHROID) 137 MCG tablet Take 1 tablet (137 mcg total) by mouth daily before breakfast. 60 tablet 0  . losartan (COZAAR) 100 MG tablet TAKE 1 TABLET EVERY DAY 90 tablet 3  . metFORMIN (GLUCOPHAGE) 1000 MG tablet Take 1 tablet (1,000 mg total) by mouth 2 (two) times daily with a meal. 180 tablet 3  . niacin (NIASPAN) 1000 MG CR tablet TAKE 2 TABLETS AT BEDTIME 180 tablet 3  . nitroGLYCERIN (NITROSTAT) 0.4 MG SL tablet Place 1 tablet (0.4 mg total) under the tongue every 5 (five) minutes as needed. 100 tablet 3  . potassium chloride (K-DUR,KLOR-CON) 10 MEQ tablet Take 1 tablet (10 mEq total) daily by mouth. 90 tablet 3  . ranitidine (ZANTAC) 300 MG tablet Take 1 tablet (300 mg total) at bedtime by mouth. 90 tablet 3  . rosuvastatin (CRESTOR) 20 MG tablet Take 1 tablet (20 mg total) daily by mouth. 90 tablet 1   No current facility-administered  medications on file prior to visit.    No Known Allergies Social History   Socioeconomic History  . Marital status: Legally Separated    Spouse name: Not on file  . Number of children: Not on file  . Years of education: Not on file  . Highest education level: Not on file  Occupational History  . Not on file  Social Needs  . Financial resource strain: Not on file  . Food insecurity:    Worry: Not on file    Inability: Not on file  . Transportation needs:    Medical: Not on file    Non-medical: Not on file  Tobacco Use  . Smoking status: Current Every Day Smoker    Packs/day: 0.50    Years: 32.00    Pack years: 16.00    Types: Cigarettes  . Smokeless tobacco: Never Used  Substance and Sexual Activity  . Alcohol use: No  . Drug use: No  . Sexual activity: Not on file  Lifestyle  . Physical activity:    Days per week: Not on file    Minutes per session: Not on file  . Stress: Not on file  Relationships  . Social connections:    Talks on phone: Not on file    Gets together: Not on file    Attends religious service: Not on file    Active member of club or organization: Not on file    Attends meetings of clubs or organizations: Not on file    Relationship status: Not on file  . Intimate partner violence:    Fear of current or ex partner: Not on file    Emotionally abused: Not on file    Physically abused: Not on file    Forced sexual activity: Not on file  Other Topics Concern  . Not on file  Social History Narrative  . Not on file     Review of Systems  All other systems reviewed and  are negative.      Objective:   Physical Exam  Constitutional: She appears well-developed and well-nourished. No distress.  HENT:  Mouth/Throat: No oropharyngeal exudate.  Eyes: Conjunctivae are normal. No scleral icterus.  Neck: Neck supple. No JVD present. No thyromegaly present.  Cardiovascular: Normal rate, regular rhythm and normal heart sounds. Exam reveals no gallop  and no friction rub.  No murmur heard. Pulmonary/Chest: Effort normal. No respiratory distress. She has no wheezes. She has rales.  Abdominal: Soft. Bowel sounds are normal. She exhibits no distension. There is no tenderness. There is no rebound and no guarding.  Musculoskeletal: She exhibits no edema.  Lymphadenopathy:    She has no cervical adenopathy.  Skin: She is not diaphoretic.  Vitals reviewed.         Assessment & Plan:  Essential hypertension  Smoking  Pure hypercholesterolemia  Controlled type 2 diabetes mellitus with complication, without long-term current use of insulin ([place]) - Plan: CBC with Differential/Platelet, COMPLETE METABOLIC PANEL WITH GFR, Lipid panel, Microalbumin, urine, Hemoglobin A1c  Hypothyroidism, unspecified type - Plan: TSH  Encounter for screening for HIV - Plan: HIV antibody  Breast cancer screening - Plan: MM Digital Screening  I will schedule the patient for a mammogram.  I recommended a Pap smear but she declined.  She states that she will schedule this herself in the future.  She will allow me to screen her for HIV.  She refuses a referral to an ophthalmologist.  She states that she will schedule this herself.  She is due to recheck her TSH for her hypothyroidism to ensure therapeutic dosage of her levothyroxine.  Regarding her diabetes, I will check a hemoglobin A1c.  Goal hemoglobin A1c is less than 7.  I will check a fasting lipid panel.  Goal LDL cholesterol is less than 100.  I will check a urine microalbumin.  Blood pressure today is well controlled at 126/72.  Continue to strongly encourage smoking cessation.

## 2017-09-06 ENCOUNTER — Encounter (INDEPENDENT_AMBULATORY_CARE_PROVIDER_SITE_OTHER): Payer: Self-pay

## 2017-09-06 LAB — COMPLETE METABOLIC PANEL WITH GFR
AG RATIO: 2 (calc) (ref 1.0–2.5)
ALKALINE PHOSPHATASE (APISO): 72 U/L (ref 33–130)
ALT: 8 U/L (ref 6–29)
AST: 13 U/L (ref 10–35)
Albumin: 4.5 g/dL (ref 3.6–5.1)
BILIRUBIN TOTAL: 0.3 mg/dL (ref 0.2–1.2)
BUN: 13 mg/dL (ref 7–25)
CHLORIDE: 104 mmol/L (ref 98–110)
CO2: 25 mmol/L (ref 20–32)
Calcium: 9.7 mg/dL (ref 8.6–10.4)
Creat: 0.65 mg/dL (ref 0.50–1.05)
GFR, EST AFRICAN AMERICAN: 115 mL/min/{1.73_m2} (ref >=60)
GFR, Est Non African American: 99 mL/min/{1.73_m2} (ref >=60)
Globulin: 2.2 g/dL (calc) (ref 1.9–3.7)
Glucose, Bld: 124 mg/dL — ABNORMAL HIGH (ref 65–99)
POTASSIUM: 4.4 mmol/L (ref 3.5–5.3)
Sodium: 139 mmol/L (ref 135–146)
TOTAL PROTEIN: 6.7 g/dL (ref 6.1–8.1)

## 2017-09-06 LAB — CBC WITH DIFFERENTIAL/PLATELET
BASOS PCT: 0.7 %
Basophils Absolute: 66 cells/uL (ref 0–200)
EOS ABS: 113 {cells}/uL (ref 15–500)
Eosinophils Relative: 1.2 %
HEMATOCRIT: 36.2 % (ref 35.0–45.0)
HEMOGLOBIN: 12.8 g/dL (ref 11.7–15.5)
Lymphs Abs: 2190 cells/uL (ref 850–3900)
MCH: 34 pg — AB (ref 27.0–33.0)
MCHC: 35.4 g/dL (ref 32.0–36.0)
MCV: 96.3 fL (ref 80.0–100.0)
MPV: 9.9 fL (ref 7.5–12.5)
Monocytes Relative: 6.4 %
Neutro Abs: 6430 cells/uL (ref 1500–7800)
Neutrophils Relative %: 68.4 %
PLATELETS: 294 10*3/uL (ref 140–400)
RBC: 3.76 10*6/uL — ABNORMAL LOW (ref 3.80–5.10)
RDW: 12.3 % (ref 11.0–15.0)
TOTAL LYMPHOCYTE: 23.3 %
WBC: 9.4 10*3/uL (ref 3.8–10.8)
WBCMIX: 602 {cells}/uL (ref 200–950)

## 2017-09-06 LAB — LIPID PANEL
Cholesterol: 141 mg/dL (ref <200)
HDL: 32 mg/dL — ABNORMAL LOW (ref >50)
LDL Cholesterol (Calc): 77 mg/dL (calc)
Non-HDL Cholesterol (Calc): 109 mg/dL (calc) (ref <130)
TRIGLYCERIDES: 233 mg/dL — AB (ref <150)
Total CHOL/HDL Ratio: 4.4 (calc) (ref <5.0)

## 2017-09-06 LAB — MICROALBUMIN, URINE: MICROALB UR: 0.4 mg/dL

## 2017-09-06 LAB — TSH: TSH: 0.57 mIU/L (ref 0.40–4.50)

## 2017-09-06 LAB — HIV ANTIBODY (ROUTINE TESTING W REFLEX): HIV 1&2 Ab, 4th Generation: NONREACTIVE

## 2017-09-06 LAB — HEMOGLOBIN A1C
Hgb A1c MFr Bld: 6.3 % of total Hgb — ABNORMAL HIGH (ref <5.7)
Mean Plasma Glucose: 134 (calc)
eAG (mmol/L): 7.4 (calc)

## 2017-09-21 ENCOUNTER — Other Ambulatory Visit: Payer: Self-pay | Admitting: Family Medicine

## 2017-09-21 DIAGNOSIS — E039 Hypothyroidism, unspecified: Secondary | ICD-10-CM

## 2017-09-21 MED ORDER — LEVOTHYROXINE SODIUM 137 MCG PO TABS: 137.0000 ug | ORAL_TABLET | Freq: Every day | ORAL | 0 refills | Status: DC

## 2017-10-04 ENCOUNTER — Ambulatory Visit
Admission: RE | Admit: 2017-10-04 | Discharge: 2017-10-04 | Disposition: A | Discharge status: Home / Self Care | Payer: Medicare HMO | Source: Ambulatory Visit | Attending: Family Medicine | Admitting: Family Medicine

## 2017-10-04 DIAGNOSIS — Z1239 Encounter for other screening for malignant neoplasm of breast: Secondary | ICD-10-CM

## 2017-10-04 DIAGNOSIS — Z1231 Encounter for screening mammogram for malignant neoplasm of breast: Secondary | ICD-10-CM | POA: Diagnosis not present

## 2017-10-18 ENCOUNTER — Telehealth: Payer: Self-pay | Admitting: Family Medicine

## 2017-10-18 ENCOUNTER — Other Ambulatory Visit: Payer: Self-pay

## 2017-10-18 ENCOUNTER — Other Ambulatory Visit: Payer: Medicare HMO

## 2017-10-18 DIAGNOSIS — E039 Hypothyroidism, unspecified: Secondary | ICD-10-CM

## 2017-10-18 NOTE — Telephone Encounter (Signed)
Patient came in today requesting refills on her levothyroxine. Patient has a Levothyroxine 137, and Levothyroxine 150. The 137 mcg dose was last called in on April. The 150 mcg dose was added to the chart historically. Would you like for me to send in both doses. Her last TSH was 09/05/17. Please advise?

## 2017-10-18 NOTE — Telephone Encounter (Signed)
I would use levothyroxine 137 mcg poqday

## 2017-10-19 NOTE — Telephone Encounter (Signed)
Left message return call

## 2017-10-19 NOTE — Telephone Encounter (Signed)
Spoke with patient and informed her per Dr.Pickard to take the 137 mcg dose of Levothyroxine only.Patient verbalized understanding.

## 2017-10-20 ENCOUNTER — Other Ambulatory Visit: Payer: Self-pay | Admitting: Family Medicine

## 2017-12-06 ENCOUNTER — Other Ambulatory Visit: Payer: Self-pay | Admitting: Family Medicine

## 2017-12-06 DIAGNOSIS — E039 Hypothyroidism, unspecified: Secondary | ICD-10-CM

## 2017-12-07 ENCOUNTER — Other Ambulatory Visit: Payer: Self-pay | Admitting: Family Medicine

## 2017-12-07 DIAGNOSIS — E039 Hypothyroidism, unspecified: Secondary | ICD-10-CM

## 2018-01-04 ENCOUNTER — Ambulatory Visit (INDEPENDENT_AMBULATORY_CARE_PROVIDER_SITE_OTHER): Payer: Medicare HMO | Admitting: Family Medicine

## 2018-01-04 ENCOUNTER — Encounter: Payer: Self-pay | Admitting: Family Medicine

## 2018-01-04 VITALS — BP 132/70 | HR 60 | Temp 98.1°F | Resp 18 | Ht 66.0 in | Wt 220.0 lb

## 2018-01-04 DIAGNOSIS — Z0000 Encounter for general adult medical examination without abnormal findings: Secondary | ICD-10-CM | POA: Diagnosis not present

## 2018-01-04 DIAGNOSIS — Z780 Asymptomatic menopausal state: Secondary | ICD-10-CM

## 2018-01-04 DIAGNOSIS — Z9181 History of falling: Secondary | ICD-10-CM | POA: Diagnosis not present

## 2018-01-04 DIAGNOSIS — Z6835 Body mass index (BMI) 35.0-35.9, adult: Secondary | ICD-10-CM | POA: Diagnosis not present

## 2018-01-04 DIAGNOSIS — E669 Obesity, unspecified: Secondary | ICD-10-CM | POA: Diagnosis not present

## 2018-01-04 DIAGNOSIS — Z13820 Encounter for screening for osteoporosis: Secondary | ICD-10-CM

## 2018-01-04 DIAGNOSIS — Z7189 Other specified counseling: Secondary | ICD-10-CM

## 2018-01-04 DIAGNOSIS — E2839 Other primary ovarian failure: Secondary | ICD-10-CM | POA: Diagnosis not present

## 2018-01-04 DIAGNOSIS — M159 Polyosteoarthritis, unspecified: Secondary | ICD-10-CM

## 2018-01-04 NOTE — Progress Notes (Deleted)
Subjective:   [name] is a 57 y.o. female who presents for Medicare Annual (Subsequent) preventive examination.  Review of Systems:   *** Cardiac Risk Factors include: advanced age (>85mn, >>63women);diabetes mellitus;sedentary lifestyle;obesity (BMI >30kg/m2);hypertension;dyslipidemia;smoking/ tobacco exposure     Objective:     Vitals: BP 132/70   Pulse 60   Temp 98.1 F (36.7 C) (Oral)   Resp 18   Ht _0  (1.676 m)   Wt 220 lb (99.8 kg)   SpO2 94%   BMI 35.51 kg/m   Body mass index is 35.51 kg/m.  Advanced Directives 01/04/2018  Does Patient Have a Medical Advance Directive? No  Would patient like information on creating a medical advance directive? Yes (ED - Information included in AVS)   Tobacco Social History   Tobacco Use  Smoking Status Current Every Day Smoker  . Packs/day: 0.50  . Years: 32.00  . Pack years: 16.00  . Types: Cigarettes  Smokeless Tobacco Never Used     Ready to quit: No Counseling given: Yes   Clinical Intake:  Pre-visit preparation completed: No  Pain : No/denies pain     Nutritional Status: BMI > 30  Obese Nutritional Risks: None Diabetes: Yes CBG done?: No Did pt. bring in CBG monitor from home?: No     Interpreter Needed?: No     Past Medical History:  Diagnosis Date  . Arthritis    knees  . Blood transfusion 08/2007  . [place] heart [place]) [place]   . [place]   . Coronary artery disease   . Diabetes mellitus   . GERD (gastroesophageal reflux disease)   . Hyperlipidemia   . Hypertension   . Hypothyroidism   . Tobacco abuse    History reviewed. No pertinent surgical history. Family History  Problem Relation Age of Onset  . Heart disease Mother   . Stroke Mother   . Heart disease Father   . Heart disease Maternal Grandmother   . Stroke Maternal Grandmother   . Heart disease Maternal Grandfather   . Kidney disease Paternal Grandmother   . Heart disease Paternal Grandfather      Social History   Socioeconomic History  . Marital status: Legally Separated    Spouse name: Not on file  . Number of children: Not on file  . Years of education: Not on file  . Highest education level: Not on file  Occupational History  . Not on file  Social Needs  . Financial resource strain: Not on file  . Food insecurity:    Worry: Not on file    Inability: Not on file  . Transportation needs:    Medical: Not on file    Non-medical: Not on file  Tobacco Use  . Smoking status: Current Every Day Smoker    Packs/day: 0.50    Years: 32.00    Pack years: 16.00    Types: Cigarettes  . Smokeless tobacco: Never Used  Substance and Sexual Activity  . Alcohol use: No  . Drug use: No  . Sexual activity: Yes  Lifestyle  . Physical activity:    Days per week: Not on file    Minutes per session: Not on file  . Stress: Not on file  Relationships  . Social connections:    Talks on phone: Not on file    Gets together: Not on file    Attends religious service: Not on file    Active member of club or organization: Not  on file    Attends meetings of clubs or organizations: Not on file    Relationship status: Not on file  Other Topics Concern  . Not on file  Social History Narrative  . Not on file    Outpatient Encounter Medications as of 01/04/2018  Medication Sig  . ACCU-CHEK AVIVA PLUS test strip USE AS INSTRUCTED  . ACCU-CHEK SOFTCLIX LANCETS lancets USE AS INSTRUCTED  . Alcohol Swabs (B-D SINGLE USE SWABS REGULAR) PADS USE AS DIRECTED  . amLODipine (NORVASC) 5 MG tablet TAKE 1 TABLET EVERY DAY  . aspirin 325 MG tablet Take 325 mg by mouth daily.  . Blood Glucose Calibration (ACCU-CHEK AVIVA) SOLN USE AS DIRECTED  . Blood Glucose Monitoring Suppl (ACCU-CHEK AVIVA PLUS) w/Device KIT Use as directed  . carvedilol (COREG) 25 MG tablet Take 1 tablet (25 mg total) 2 (two) times daily with a meal by mouth.  . furosemide (LASIX) 20 MG tablet TAKE 1 TABLET EVERY DAY  .  levothyroxine (SYNTHROID, LEVOTHROID) 137 MCG tablet TAKE 1 TABLET BY MOUTH ONCE DAILY BEFORE BREAKFAST  . losartan (COZAAR) 100 MG tablet TAKE 1 TABLET EVERY DAY  . metFORMIN (GLUCOPHAGE) 1000 MG tablet TAKE 1 TABLET TWICE DAILY WITH A MEAL  . nitroGLYCERIN (NITROSTAT) 0.4 MG SL tablet Place 1 tablet (0.4 mg total) under the tongue every 5 (five) minutes as needed.  . potassium chloride (K-DUR,KLOR-CON) 10 MEQ tablet Take 1 tablet (10 mEq total) daily by mouth.  . ranitidine (ZANTAC) 300 MG tablet Take 1 tablet (300 mg total) at bedtime by mouth.  . rosuvastatin (CRESTOR) 20 MG tablet TAKE 1 TABLET EVERY DAY  . [DISCONTINUED] levothyroxine (SYNTHROID, LEVOTHROID) 137 MCG tablet TAKE 1 TABLET BY MOUTH ONCE DAILY BEFORE BREAKFAST   No facility-administered encounter medications on file as of 01/04/2018.     Activities of Daily Living No flowsheet data found.  Patient Care Team: Susy Frizzle, MD as PCP - General (Family Medicine) Verl Blalock, Marijo Conception, MD (Inactive) as Attending Physician (Cardiology)    Assessment:   This is a routine wellness examination for [facility].  Exercise Activities and Dietary recommendations Current Exercise Habits: The patient does not participate in regular exercise at present, Exercise limited by: orthopedic condition(s)  Goals    None      Fall Risk Fall Risk  01/04/2018 01/04/2018 12/14/2013  Falls in the past year? No No No   Is the patient's home free of loose throw rugs in walkways, pet beds, electrical cords, etc?   {Blank single:19197::"yes","no"}      Grab bars in the bathroom? {Blank single:19197::"yes","no"}      Handrails on the stairs?   {Blank single:19197::"yes","no"}      Adequate lighting?   {Blank single:19197::"yes","no"}    Depression Screen PHQ 2/9 Scores 01/04/2018 01/04/2018 03/03/2017 10/23/2015  PHQ - 2 Score 0 0 0 0  PHQ- 9 Score - - 0 -     Cognitive Function Cognitive Testing   Alert? Yes  Normal Appearance?Yes  Oriented to  person? Yes  Place? Yes  Time? Yes  Recall of three objects? Yes  Can perform simple calculations? Yes  Displays appropriate judgment?Yes  Can read the correct time from a watch face?Yes          Immunization History  Administered Date(s) Administered  . H1N1 03/29/2008  . Influenza,inj,Quad PF,6+ Mos 02/20/2013, 05/09/2014, 03/04/2015, 03/26/2016, 03/03/2017  . Influenza-Unspecified 04/16/2010, 02/02/2011, 02/22/2012, 03/03/2017  . Pneumococcal Polysaccharide-23 08/30/2007, 09/05/2017  . Td 05/03/2011  . Tdap  05/03/2011    Qualifies for Shingles Vaccine?***  Screening Tests Health Maintenance  Topic Date Due  . PAP SMEAR  03/12/1982  . FOOT EXAM  10/22/2016  . INFLUENZA VACCINE  12/29/2017  . OPHTHALMOLOGY EXAM  03/31/2018 (Originally 03/13/1971)  . HEMOGLOBIN A1C  03/07/2018  . MAMMOGRAM  10/05/2019  . TETANUS/TDAP  05/02/2021  . COLONOSCOPY  09/22/2021  . PNEUMOCOCCAL POLYSACCHARIDE VACCINE  Completed  . Hepatitis C Screening  Completed  . HIV Screening  Completed    Cancer Screenings: Lung: Low Dose CT Chest recommended if Age 59-80 years, 30 pack-year currently smoking OR have quit w/in 15years. Patient {DOES NOT does:27190::"does not"} qualify. Breast:  Up to date on Mammogram? {Yes/No:30480221}   Up to date of Bone Density/Dexa? {Yes/No:30480221} Colorectal: ***  Additional Screenings: ***: Hepatitis C Screening:      Plan:   ***   I have personally reviewed and noted the following in the patient's chart:   . Medical and social history . Use of alcohol, tobacco or illicit drugs  . Current medications and supplements . Functional ability and status . Nutritional status . Physical activity . Advanced directives . List of other physicians . Hospitalizations, surgeries, and ER visits in previous 12 months . Vitals . Screenings to include cognitive, depression, and falls . Referrals and appointments  In addition, I have reviewed and discussed with  patient certain preventive protocols, quality metrics, and best practice recommendations. A written personalized care plan for preventive services as well as general preventive health recommendations were provided to patient.     Delsa Grana, PA-C  01/04/2018

## 2018-01-04 NOTE — Progress Notes (Signed)
Subjective:   [name] is a 57 y.o. female who presents for Medicare Annual (Subsequent) preventive examination.  She was scheduled to close GAPs in wellness and preventative care.  Main gaps in care are PAP, which pt has refused in the past, she is postmenopausal, asx, diabetic foot exam and diabetic eye exam.  Eye exam done - per pt.  Need records.  Foot exam to do today.  No sx or complaints of callous or wounds.      Review of Systems:   Review of Systems  Constitutional: Negative.  Negative for activity change, appetite change, fatigue and unexpected weight change.  HENT: Negative.   Eyes: Negative.   Respiratory: Negative.  Negative for shortness of breath.   Cardiovascular: Negative.  Negative for chest pain, palpitations and leg swelling.  Gastrointestinal: Negative.  Negative for abdominal pain and blood in stool.  Endocrine: Negative.   Genitourinary: Negative.   Musculoskeletal: Negative.  Negative for arthralgias, gait problem, joint swelling and myalgias.  Skin: Negative.  Negative for color change, pallor and rash.  Allergic/Immunologic: Negative.   Neurological: Negative.  Negative for syncope and weakness.  Hematological: Negative.   Psychiatric/Behavioral: Negative.  Negative for confusion, dysphoric mood, self-injury and suicidal ideas. The patient is not nervous/anxious.   All other systems reviewed and are negative.   Cardiac Risk Factors include: advanced age (>27mn, >>59women);diabetes mellitus;sedentary lifestyle;obesity (BMI >30kg/m2);hypertension;dyslipidemia;smoking/ tobacco exposure     Objective:     Vitals: BP 132/70   Pulse 60   Temp 98.1 F (36.7 C) (Oral)   Resp 18   Ht '5\' 6"'  (1.676 m)   Wt 220 lb (99.8 kg)   SpO2 94%   BMI 35.51 kg/m   Body mass index is 35.51 kg/m.   Physical Exam  Constitutional: She appears well-developed and well-nourished. No distress.  HENT:  Head: Normocephalic and atraumatic.  Nose: Nose normal.    Eyes: Conjunctivae are normal. Right eye exhibits no discharge. Left eye exhibits no discharge.  Neck: No tracheal deviation present.  Cardiovascular: Normal rate and regular rhythm.  Pulmonary/Chest: Effort normal. No stridor. No respiratory distress.  Musculoskeletal: Normal range of motion.  Foot exam completed  Neurological: She is alert. She exhibits normal muscle tone. Coordination normal.  Skin: Skin is warm and dry. No rash noted. She is not diaphoretic.  Psychiatric: She has a normal mood and affect. Her behavior is normal.  Nursing note and vitals reviewed.    Diabetic Foot Exam - detailed  Date & Time: 01/04/2018 9:10 AM Performed  By LDelsa Grana PA-C  Diabetic Foot exam was performed with the following findings: Yes  Is there a history of foot ulcer?: No  Is there a foot ulcer now?: No  Is there swelling?: No  Is there elevated skin temperature?: No  Is there abnormal foot shape?: No  Is there a claw toe deformity?: No  Are the toenails long?: No  Are the toenails thick?: Yes  Are the toenails ingrown?: No  Is the skin thin, fragile, shiny and hairless?": No  Normal Range of Motion?: No  Is there foot or ankle muscle weakness?: No  Do you have pain in calf while walking?: Yes  Are the shoes appropriate in style and fit?: Yes  Can the patient see the bottom of their feet?: Yes  Pulse Foot Exam completed.: Yes     Sensory Foot Exam Completed.: Yes  Semmes-Weinstein Monofilament Test  "+" means "has sensation" and "-" means "no  sensation"  R Foot Test Control: Pos L Foot Test Control: Pos  R Site 1-Great Toe: Pos L Site 1-Great Toe: Pos  R Site 4: Pos L Site 4: Pos  R site 5: Neg L Site 5: Pos  R Site 6: Pos L Site 6: Pos     Image components are not supported.  Image components are not supported. Image components are not supported.  [place]  Comments    Advanced Directives 01/04/2018  Does Patient Have a Medical Advance Directive? No  Would patient  like information on creating a medical advance directive? Yes (ED - Information included in AVS)   Tobacco Social History   Tobacco Use  Smoking Status Current Every Day Smoker  . Packs/day: 0.50  . Years: 32.00  . Pack years: 16.00  . Types: Cigarettes  Smokeless Tobacco Never Used     Ready to quit: No Counseling given: Yes Smoking cessation instruction/counseling given:  counseled patient on the dangers of tobacco use, advised patient to stop smoking, and reviewed strategies to maximize success   Clinical Intake:  Pre-visit preparation completed: No  Pain : No/denies pain     Nutritional Status: BMI > 30  Obese Nutritional Risks: None Diabetes: Yes CBG done?: No Did pt. bring in CBG monitor from home?: No     Interpreter Needed?: No     Past Medical History:  Diagnosis Date  . Arthritis    knees  . Blood transfusion 08/2007  . CHF (congestive heart failure) ([place])   . Claudication ([place])   . Coronary artery disease   . Diabetes mellitus   . GERD (gastroesophageal reflux disease)   . Hyperlipidemia   . Hypertension   . Hypothyroidism   . Tobacco abuse    History reviewed. No pertinent surgical history. Family History  Problem Relation Age of Onset  . Heart disease Mother   . Stroke Mother   . Heart disease Father   . Heart disease Maternal Grandmother   . Stroke Maternal Grandmother   . Heart disease Maternal Grandfather   . Kidney disease Paternal Grandmother   . Heart disease Paternal Grandfather    Social History   Socioeconomic History  . Marital status: Legally Separated    Spouse name: Not on file  . Number of children: Not on file  . Years of education: Not on file  . Highest education level: Not on file  Occupational History  . Not on file  Social Needs  . Financial resource strain: Not on file  . Food insecurity:    Worry: Not on file    Inability: Not on file  . Transportation needs:    Medical: Not on file    Non-medical: Not  on file  Tobacco Use  . Smoking status: Current Every Day Smoker    Packs/day: 0.50    Years: 32.00    Pack years: 16.00    Types: Cigarettes  . Smokeless tobacco: Never Used  Substance and Sexual Activity  . Alcohol use: No  . Drug use: No  . Sexual activity: Yes  Lifestyle  . Physical activity:    Days per week: Not on file    Minutes per session: Not on file  . Stress: Not on file  Relationships  . Social connections:    Talks on phone: Not on file    Gets together: Not on file    Attends religious service: Not on file    Active member of club or organization: Not  on file    Attends meetings of clubs or organizations: Not on file    Relationship status: Not on file  Other Topics Concern  . Not on file  Social History Narrative  . Not on file    Outpatient Encounter Medications as of 01/04/2018  Medication Sig  . ACCU-CHEK AVIVA PLUS test strip USE AS INSTRUCTED  . ACCU-CHEK SOFTCLIX LANCETS lancets USE AS INSTRUCTED  . Alcohol Swabs (B-D SINGLE USE SWABS REGULAR) PADS USE AS DIRECTED  . amLODipine (NORVASC) 5 MG tablet TAKE 1 TABLET EVERY DAY  . aspirin 325 MG tablet Take 325 mg by mouth daily.  . Blood Glucose Calibration (ACCU-CHEK AVIVA) SOLN USE AS DIRECTED  . Blood Glucose Monitoring Suppl (ACCU-CHEK AVIVA PLUS) w/Device KIT Use as directed  . carvedilol (COREG) 25 MG tablet Take 1 tablet (25 mg total) 2 (two) times daily with a meal by mouth.  . furosemide (LASIX) 20 MG tablet TAKE 1 TABLET EVERY DAY  . levothyroxine (SYNTHROID, LEVOTHROID) 137 MCG tablet TAKE 1 TABLET BY MOUTH ONCE DAILY BEFORE BREAKFAST  . losartan (COZAAR) 100 MG tablet TAKE 1 TABLET EVERY DAY  . metFORMIN (GLUCOPHAGE) 1000 MG tablet TAKE 1 TABLET TWICE DAILY WITH A MEAL  . nitroGLYCERIN (NITROSTAT) 0.4 MG SL tablet Place 1 tablet (0.4 mg total) under the tongue every 5 (five) minutes as needed.  . potassium chloride (K-DUR,KLOR-CON) 10 MEQ tablet Take 1 tablet (10 mEq total) daily by mouth.   . ranitidine (ZANTAC) 300 MG tablet Take 1 tablet (300 mg total) at bedtime by mouth.  . rosuvastatin (CRESTOR) 20 MG tablet TAKE 1 TABLET EVERY DAY  . [DISCONTINUED] levothyroxine (SYNTHROID, LEVOTHROID) 137 MCG tablet TAKE 1 TABLET BY MOUTH ONCE DAILY BEFORE BREAKFAST   No facility-administered encounter medications on file as of 01/04/2018.     Activities of Daily Living In your present state of health, do you have any difficulty performing the following activities: 01/04/2018  Hearing? N  Vision? N  Difficulty concentrating or making decisions? N  Walking or climbing stairs? Y  Dressing or bathing? N  Doing errands, shopping? N  Preparing Food and eating ? N  Using the Toilet? N  In the past six months, have you accidently leaked urine? N  Do you have problems with loss of bowel control? N  Managing your Medications? N  Managing your Finances? N  Housekeeping or managing your Housekeeping? N  Some recent data might be hidden     Patient Care Team: Susy Frizzle, MD as PCP - General (Family Medicine) Verl Blalock, Marijo Conception, MD (Inactive) as Attending Physician (Cardiology)    Eye exam scheduled [facility] - 03/24/18     Assessment:   This is a routine wellness examination for [facility].   Problem List Items Addressed This Visit      Musculoskeletal and Integument   Osteoarthritis of multiple joints   Relevant Orders   Ambulatory referral to Physical Therapy     Other   Class 2 obesity with body mass index (BMI) of 35.0 to 35.9 in adult   Relevant Orders   Amb ref to Medical Nutrition Therapy-MNT    Other Visit Diagnoses    Encounter for Medicare annual wellness exam    -  Primary   Estrogen deficiency       Relevant Orders   DG Bone Density   Postmenopausal       Relevant Orders   DG Bone Density   Screening for osteoporosis  Relevant Orders   DG Bone Density   At risk for falls       Relevant Orders   Ambulatory referral to Physical Therapy         Exercise Activities and Dietary recommendations Current Exercise Habits: The patient does not participate in regular exercise at present, Exercise limited by: orthopedic condition(s)(arthritis in knees)  Goals    . DIET - REDUCE CALORIE INTAKE       Fall Risk Fall Risk  01/04/2018 01/04/2018 12/14/2013  Falls in the past year? No No No   Is the patient's home free of loose throw rugs in walkways, pet beds, electrical cords, etc?   yes      Grab bars in the bathroom? no      Handrails on the stairs?   yes      Adequate lighting?   yes    Depression Screen PHQ 2/9 Scores 01/04/2018 01/04/2018 03/03/2017 10/23/2015  PHQ - 2 Score 0 0 0 0  PHQ- 9 Score - - 0 -      Office Visit from 01/04/2018 in [place]  AUDIT-C Score  0       Cognitive Function Cognitive Testing   Alert? Yes  Normal Appearance?Yes  Oriented to person? Yes  Place? Yes  Time? Yes  Recall of three objects? Yes  Can perform simple calculations? Yes  Displays appropriate judgment?Yes  Can read the correct time from a watch face?Yes          Immunization History  Administered Date(s) Administered  . H1N1 03/29/2008  . Influenza,inj,Quad PF,6+ Mos 02/20/2013, 05/09/2014, 03/04/2015, 03/26/2016, 03/03/2017  . Influenza-Unspecified 04/16/2010, 02/02/2011, 02/22/2012, 03/03/2017  . Pneumococcal Polysaccharide-23 08/30/2007, 09/05/2017  . Td 05/03/2011  . Tdap 05/03/2011     Screening Tests Health Maintenance  Topic Date Due  . INFLUENZA VACCINE  12/29/2017  . PAP SMEAR  01/30/2018 (Originally 03/12/1982)  . OPHTHALMOLOGY EXAM  03/31/2018 (Originally 03/13/1971)  . HEMOGLOBIN A1C  03/07/2018  . FOOT EXAM  01/05/2019  . MAMMOGRAM  10/05/2019  . TETANUS/TDAP  05/02/2021  . COLONOSCOPY  09/22/2021  . PNEUMOCOCCAL POLYSACCHARIDE VACCINE AGE 28-64 HIGH RISK  Completed  . Hepatitis C Screening  Completed  . HIV Screening  Completed    Cancer Screenings: Lung: Low Dose CT Chest  recommended if Age 26-80 years, 30 pack-year currently smoking OR have quit w/in 15years. Patient does not qualify. Breast:  Up to date on Mammogram? Yes   Up to date of Bone Density/Dexa? No - ordered Colorectal: see above  Additional Screenings: : Hepatitis C Screening: completed  Eye exam scheduled for Oct. 25, 2019 with [facility]      Plan:         ICD-10-CM   1. Encounter for Medicare annual wellness exam Z00.00   2. Estrogen deficiency E28.39 DG Bone Density  3. Postmenopausal Z78.0 DG Bone Density  4. Screening for osteoporosis Z13.820 DG Bone Density  5. Class 2 obesity with body mass index (BMI) of 35.0 to 35.9 in adult, unspecified obesity type, unspecified whether serious comorbidity present E66.9 Amb ref to Medical Nutrition Therapy-MNT   Z68.35   6. Osteoarthritis of multiple joints, unspecified osteoarthritis type M15.9 Ambulatory referral to Physical Therapy  7. At risk for falls Z91.81 Ambulatory referral to Physical Therapy  8. Advanced directives, counseling/discussion Z71.89      I have personally reviewed and noted the following in the patient's chart:   . Medical and social history . Use  of alcohol, tobacco or illicit drugs  . Current medications and supplements . Functional ability and status . Nutritional status . Physical activity . Advanced directives . List of other physicians . Hospitalizations, surgeries, and ER visits in previous 12 months . Vitals . Screenings to include cognitive, depression, and falls . Referrals and appointments  In addition, I have reviewed and discussed with patient certain preventive protocols, quality metrics, and best practice recommendations. A written personalized care plan for preventive services as well as general preventive health recommendations were provided to patient.     Delsa Grana, PA-C  01/04/2018    01/19/18 11:56 AM Delsa Grana, PA-C  Error with chart, unable to close/sign encounter until  today

## 2018-01-04 NOTE — Patient Instructions (Signed)
Make an appointment with me to do PAP   [name] , Thank you for taking time to come for your Medicare Wellness Visit. I appreciate your ongoing commitment to your health goals. Please review the following plan we discussed and let me know if I can assist you in the future.   These are the goals we discussed: Goals    None      This is a list of the screening recommended for you and due dates:  Health Maintenance  Topic Date Due  . Pap Smear  03/12/1982  . Complete foot exam   10/22/2016  . Flu Shot  12/29/2017  . Eye exam for diabetics  03/31/2018*  . Hemoglobin A1C  03/07/2018  . Mammogram  10/05/2019  . Tetanus Vaccine  05/02/2021  . Colon Cancer Screening  09/22/2021  . Pneumococcal vaccine  Completed  .  Hepatitis C: One time screening is recommended by Center for Disease Control  (CDC) for  adults born from [birth year] through 1965.   Completed  . HIV Screening  Completed  *Topic was postponed. The date shown is not the original due date.     Medical Screening Exam A medical screening exam helps determine whether or not you need emergency medical treatment. During the medical screening exam, a health care provider does a short physical exam and medical history to assess:  Your current symptoms.  Your overall health.  Depending on your symptoms, you may need additional tests. What are the possible outcomes of a medical screening exam? Your medical screening exam may determine that:  You do not need emergency treatment at this time.  You need treatment right away.  You need to be transferred to another medical center.  When should I seek medical care? If you have a regular health care provider, make an appointment for a follow-up visit with him or her. If you do not have a regular health care provider, ask about resources in your community. Get help right away if: Your condition may change over time. If your condition gets worse or you develop new or troubling  symptoms before you see your health care provider, go to an emergency department right away. In an emergency:  Call 911 or have someone drive you to the nearest hospital.  Do not drive yourself. This information is not intended to replace advice given to you by your health care provider. Make sure you discuss any questions you have with your health care provider. Document Released: 06/24/2004 Document Revised: 01/27/2016 Document Reviewed: 02/27/2015 Elsevier Interactive Patient Education  [employer]2018 [employer]

## 2018-01-10 DIAGNOSIS — M159 Polyosteoarthritis, unspecified: Secondary | ICD-10-CM | POA: Insufficient documentation

## 2018-01-11 ENCOUNTER — Ambulatory Visit (INDEPENDENT_AMBULATORY_CARE_PROVIDER_SITE_OTHER): Payer: Medicare HMO | Admitting: Family Medicine

## 2018-01-11 ENCOUNTER — Encounter: Payer: Self-pay | Admitting: Family Medicine

## 2018-01-11 VITALS — BP 130/64 | HR 63 | Temp 97.8°F | Resp 18 | Ht 66.0 in | Wt 220.0 lb

## 2018-01-11 DIAGNOSIS — Z124 Encounter for screening for malignant neoplasm of cervix: Secondary | ICD-10-CM | POA: Diagnosis not present

## 2018-01-11 DIAGNOSIS — Z01419 Encounter for gynecological examination (general) (routine) without abnormal findings: Secondary | ICD-10-CM | POA: Diagnosis not present

## 2018-01-11 NOTE — Progress Notes (Signed)
Patient ID: [name], female    DOB: 07-Dec-1960, 57 y.o.   MRN: [id number]  PCP: Susy Frizzle, MD  Chief Complaint  Patient presents with  . Gynecologic Exam    Subjective:   [name] is a 57 y.o. female, presents to clinic with CC of need for PAP and Gyn exam.  She is s/p partial hysterectomy and b/l oophrectomy with unknown last PAP.  No hx of abnormal pap. No vaginal sx, no abd pain.   Patient Active Problem List   Diagnosis Date Noted  . Osteoarthritis of multiple joints 01/10/2018  . Peripheral arterial disease ([place]) 03/26/2013  . Essential hypertension 03/26/2013  . Pain in joint, ankle and foot 11/01/2012  .            11/01/2012  . Deformity of metatarsal 11/01/2012  . Left carotid bruit 06/30/2011  . Type II or unspecified type diabetes mellitus with neurological manifestations, not stated as uncontrolled(250.60) 08/15/2010  . Class 2 obesity with body mass index (BMI) of 35.0 to 35.9 in adult 12/17/2009  . TOBACCO ABUSE 12/17/2009  . HYPERLIPIDEMIA-MIXED 06/04/2009  . OLD MYOCARDIAL INFARCTION 06/04/2009  . CAD, NATIVE VESSEL 06/04/2009  . HYPOTHYROIDISM 06/03/2009  . MOOD DISORDER 06/03/2009  . PSYCHOSIS 06/03/2009  . COMMON MIGRAINE 06/03/2009  . HYPERTENSION 06/03/2009     Prior to Admission medications   Medication Sig Start Date End Date Taking? Authorizing Provider  ACCU-CHEK AVIVA PLUS test strip USE AS INSTRUCTED 10/20/17  Yes Susy Frizzle, MD  ACCU-CHEK SOFTCLIX LANCETS lancets USE AS INSTRUCTED 10/20/17  Yes Susy Frizzle, MD  Alcohol Swabs (B-D SINGLE USE SWABS REGULAR) PADS USE AS DIRECTED 10/20/17  Yes Susy Frizzle, MD  amLODipine (NORVASC) 5 MG tablet TAKE 1 TABLET EVERY DAY 10/20/17  Yes Susy Frizzle, MD  aspirin 325 MG tablet Take 325 mg by mouth daily.   Yes [provider]  Blood Glucose Calibration (ACCU-CHEK AVIVA) SOLN USE AS DIRECTED 10/20/17  Yes Susy Frizzle, MD  Blood Glucose Monitoring  Suppl (ACCU-CHEK AVIVA PLUS) w/Device KIT Use as directed 09/20/16  Yes Susy Frizzle, MD  carvedilol (COREG) 25 MG tablet Take 1 tablet (25 mg total) 2 (two) times daily with a meal by mouth. 04/15/17  Yes Susy Frizzle, MD  furosemide (LASIX) 20 MG tablet TAKE 1 TABLET EVERY DAY 07/14/17  Yes Susy Frizzle, MD  levothyroxine (SYNTHROID, LEVOTHROID) 137 MCG tablet TAKE 1 TABLET BY MOUTH ONCE DAILY BEFORE BREAKFAST 12/06/17  Yes Susy Frizzle, MD  losartan (COZAAR) 100 MG tablet TAKE 1 TABLET EVERY DAY 10/20/17  Yes Susy Frizzle, MD  metFORMIN (GLUCOPHAGE) 1000 MG tablet TAKE 1 TABLET TWICE DAILY WITH A MEAL 10/20/17  Yes Susy Frizzle, MD  nitroGLYCERIN (NITROSTAT) 0.4 MG SL tablet Place 1 tablet (0.4 mg total) under the tongue every 5 (five) minutes as needed. 08/29/13  Yes Susy Frizzle, MD  potassium chloride (K-DUR,KLOR-CON) 10 MEQ tablet Take 1 tablet (10 mEq total) daily by mouth. 04/15/17  Yes Susy Frizzle, MD  ranitidine (ZANTAC) 300 MG tablet Take 1 tablet (300 mg total) at bedtime by mouth. 04/15/17  Yes Susy Frizzle, MD  rosuvastatin (CRESTOR) 20 MG tablet TAKE 1 TABLET EVERY DAY 10/20/17  Yes Susy Frizzle, MD     No Known Allergies   Family History  Problem Relation Age of Onset  . Heart disease Mother   . Stroke Mother   .  Heart disease Father   . Heart disease Maternal Grandmother   . Stroke Maternal Grandmother   . Heart disease Maternal Grandfather   . Kidney disease Paternal Grandmother   . Heart disease Paternal Grandfather      Social History   Socioeconomic History  . Marital status: Legally Separated    Spouse name: Not on file  . Number of children: Not on file  . Years of education: Not on file  . Highest education level: Not on file  Occupational History  . Not on file  Social Needs  . Financial resource strain: Not on file  . Food insecurity:    Worry: Not on file    Inability: Not on file  . Transportation  needs:    Medical: Not on file    Non-medical: Not on file  Tobacco Use  . Smoking status: Current Every Day Smoker    Packs/day: 0.50    Years: 32.00    Pack years: 16.00    Types: Cigarettes  . Smokeless tobacco: Never Used  Substance and Sexual Activity  . Alcohol use: No  . Drug use: No  . Sexual activity: Yes  Lifestyle  . Physical activity:    Days per week: Not on file    Minutes per session: Not on file  . Stress: Not on file  Relationships  . Social connections:    Talks on phone: Not on file    Gets together: Not on file    Attends religious service: Not on file    Active member of club or organization: Not on file    Attends meetings of clubs or organizations: Not on file    Relationship status: Not on file  . Intimate partner violence:    Fear of current or ex partner: Not on file    Emotionally abused: Not on file    Physically abused: Not on file    Forced sexual activity: Not on file  Other Topics Concern  . Not on file  Social History Narrative  . Not on file     Review of Systems  Constitutional: Negative.   HENT: Negative.   Eyes: Negative.   Respiratory: Negative.   Cardiovascular: Negative.   Gastrointestinal: Negative.   Endocrine: Negative.   Genitourinary: Negative.   Musculoskeletal: Negative.   Skin: Negative.   Allergic/Immunologic: Negative.   Neurological: Negative.   Hematological: Negative.   Psychiatric/Behavioral: Negative.   All other systems reviewed and are negative.      Objective:    Vitals:   01/11/18 1448  BP: 130/64  Pulse: 63  Resp: 18  Temp: 97.8 F (36.6 C)  TempSrc: Oral  SpO2: 94%  Weight: 220 lb (99.8 kg)  Height: '5\' 6"'  (1.676 m)      Physical Exam  Constitutional: She appears well-developed and well-nourished.  HENT:  Head: Normocephalic and atraumatic.  Nose: Nose normal.  Eyes: Conjunctivae are normal. Right eye exhibits no discharge. Left eye exhibits no discharge.  Neck: No tracheal  deviation present.  Cardiovascular: Normal rate and regular rhythm.  Pulmonary/Chest: Effort normal. No stridor. No respiratory distress.  Genitourinary: Pelvic exam was performed with patient supine. There is no rash, tenderness, lesion or injury on the right labia. There is no rash, tenderness, lesion or injury on the left labia. Right adnexum displays no mass, no tenderness and no fullness. Left adnexum displays no mass, no tenderness and no fullness. No erythema, tenderness or bleeding in the vagina. No foreign body in  the vagina. No signs of injury around the vagina. No vaginal discharge found.  Genitourinary Comments: Cervix not completely visualized, PAP samples taken  With spatula and brush.  No tenderness, masses, no visualized friability or erythema.  Moderate white discharge in vaginal vault.  Chaperone by Margreta Journey Six LPN  Musculoskeletal: Normal range of motion.  Neurological: She is alert. She exhibits normal muscle tone. Coordination normal.  Skin: Skin is warm and dry. No rash noted.  Psychiatric: She has a normal mood and affect. Her behavior is normal.  Nursing note and vitals reviewed.     STATEMENT OF ADEQUACY:      Comment: Satisfactory for evaluation.  Endocervical/transformation zone component  present.  Partially obscuring inflammation    INTERPRETATION/RESULT:      Comment: Negative for intraepithelial lesion or malignancy.       Results for orders placed or performed in visit on 01/11/18  PAP,TP IMGw/HPV RNA,rflx [id number],50/38  Result Value Ref Range   Clinical Information:     LMP:     PREV. PAP:     PREV. BX:     HPV DNA Probe-Source     STATEMENT OF ADEQUACY:     INTERPRETATION/RESULT:     Comment:     CYTOTECHNOLOGIST:     HPV DNA High Risk Not Detected Not Detect         Assessment & Plan:      ICD-10-CM   1. Encounter for cervical Pap smear with pelvic exam Z01.419 PAP, Thin Prep w/HPV rflx HPV Type 16/18     Pelvic exam and PAP  performed on 57 y/o female with reported subtotal hysterectomy, unknown last PAP.  Guidelines reviewed with pt which stated unknown status of presence or abscence of cervix s/p hysterectomy should proceed with exam and testing.  PAP resulted with adequate endocervical cells, negative for malignancy or intraepithelial lesion, HPV testing negative.  Next PAP 5 years  Delsa Grana, [place] 01/11/18 3:07 PM

## 2018-01-11 NOTE — Patient Instructions (Signed)

## 2018-01-13 ENCOUNTER — Encounter: Payer: Self-pay | Admitting: Family Medicine

## 2018-01-13 LAB — PAP, TP IMAGING W/ HPV RNA, RFLX HPV TYPE 16,18/45: HPV DNA HIGH RISK: NOT DETECTED

## 2018-01-13 NOTE — Progress Notes (Signed)
PAP neg and reflex HPV neg, pt with unsure subtotal hysterectomy surgical hx, unknown last PAP, proceeded with PAP with presence of endocervical cells.  Should redo PAP in 5 years  Letter sent to pt via mychart

## 2018-01-24 ENCOUNTER — Other Ambulatory Visit: Payer: Self-pay | Admitting: Family Medicine

## 2018-03-07 ENCOUNTER — Ambulatory Visit (INDEPENDENT_AMBULATORY_CARE_PROVIDER_SITE_OTHER): Payer: Medicare HMO | Admitting: Family Medicine

## 2018-03-07 ENCOUNTER — Encounter: Payer: Self-pay | Admitting: Family Medicine

## 2018-03-07 VITALS — BP 120/70 | HR 64 | Temp 97.9°F | Resp 18 | Ht 66.0 in | Wt 216.0 lb

## 2018-03-07 DIAGNOSIS — E039 Hypothyroidism, unspecified: Secondary | ICD-10-CM | POA: Diagnosis not present

## 2018-03-07 DIAGNOSIS — Z23 Encounter for immunization: Secondary | ICD-10-CM | POA: Diagnosis not present

## 2018-03-07 DIAGNOSIS — E118 Type 2 diabetes mellitus with unspecified complications: Secondary | ICD-10-CM

## 2018-03-07 DIAGNOSIS — I10 Essential (primary) hypertension: Secondary | ICD-10-CM | POA: Diagnosis not present

## 2018-03-07 DIAGNOSIS — F17200 Nicotine dependence, unspecified, uncomplicated: Secondary | ICD-10-CM | POA: Diagnosis not present

## 2018-03-07 DIAGNOSIS — E7849 Other hyperlipidemia: Secondary | ICD-10-CM | POA: Diagnosis not present

## 2018-03-07 NOTE — Progress Notes (Signed)
Subjective:    Patient ID: [name], female    DOB: July 15, 1960, 57 y.o.   MRN: [id number]  Diabetes   09/17/14 Patient is here today for followup of her multiple medical problems. She has history of hyperlipidemia. She is currently on Crestor 20 mg by mouth daily. Due to her history of cardiovascular disease as well as peripheral vascular disease, her goal LDL cholesterol is less than 70. She denies any myalgias or right upper quadrant pain. Unfortunately she continues to smoke. She has no desire to quit smoking at the present time. She is also taking Niaspan as well. Her cardiologist recommended she stop niaspan and reduce aspirin to 81 mg poqday.  Her biggest problem with niaspan is flushing. Her blood pressure is currently well controlled. She denies any chest pain short of breath or dyspnea on exertion. She is taking her beta blocker as well as her angiotensin receptor blocker for secondary prevention of cardiovascular disease. She is also currently taking metformin 500 mg by mouth daily for diabetes mellitus type 2. She denies polyuria, polydipsia, or blurred vision.  At that time, my plan was: Due to her peripheral vascular disease her history of heart disease, her diabetes, I strongly recommended the patient quit smoking. She has no desire to quit smoking at the present time. Her blood pressure is adequately controlled I did recommend that she monitor her blood pressure more closely at home and call me if her blood pressures consistently greater than 335 systolic. I will check a hemoglobin A1c as well as a urine microalbumin. I will also check a fasting lipid panel. Goal LDL cholesterol is less than 70. I told the patient I am okay with her discontinuing Niaspan if the flushing is problematic. She would like to stop that medicine at the present time.  03/04/15 Patient continues to smoke. She has no desire to quit. She is audibly wheezing today on examination. She denies any chest pain. She  denies any angina. She denies any claudication in her legs although she is extremely sedentary. She does not exercise at all. Diabetic foot exam is performed today. She has weakly palpable dorsalis pedis pulses bilaterally. She has known peripheral vascular disease with depressed ABIs on arterial Dopplers performed in 2014. She is compliant with aspirin. She is compliant with Crestor. She denies any myalgias or right upper quadrant pain. She is not regularly checking her sugars are low the last 3 mornings, her sugars have been ranging 120-140. She denies any polyuria, polydipsia, or blurred vision. She is due today for her flu shot. She is also due for diabetic eye exam although she has not scheduled this yet.  At that time, my plan was: I again strongly strongly encouraged the patient to quit smoking. I believe she has stage I COPD just based on her audible wheezing. She refuses to quit. I will check a fasting lipid panel. Based on her past medical history, her goal LDL cholesterol is less than 70, her goal HDL cholesterol is greater than 45. I will check a hemoglobin A1c. Her goal hemoglobin A1c is less than 6.5. I recommended a diabetic eye exam. She is compliant with her aspirin. Her blood pressure is controlled today. She received her flu shot  10/23/15 Patient is still smoking. She has no desire to quit at the present time. She denies any chest pain shortness of breath or dyspnea on exertion. Unfortunately her oncologist passed away. Regarding her blood sugars, she states that her blood sugars in  the morning are typically between 120 and 130. She seldom checks them later that afternoon but when she does they're typically less than 160 which sound excellent. She denies frequent hypoglycemia. Does happen rarely and occasionally but she easily recognizes it is able to adjust it. Blood pressure today is slightly elevated at 146. She is due for a recheck a TSH. She is also due for hepatitis C screening. She  denies any myalgias or right upper quadrant pain. She does report some numbness and tingling in her feet.  At that time, my plan was: Patient is due for hepatitis C screening. I will complete that today. Blood pressures elevated and I will increase losartan to 100 mg by mouth daily. Check a hemoglobin A1c. Goal hemoglobin A1c is less than 6.5. I'll also check a urine microalbumin. Regarding her hypothyroidism I'll check a TSH. I will check a fasting lipid panel. Goal LDL cholesterol is less than 70 given her history of ASCVD. She is compliant with an aspirin. Diabetic foot exam is performed today. Recommended diabetic eye exam annually   03/26/16 Patient is here today for follow-up. She denies any complaints but while I'm standing in the hallway reviewing her chart I can hear the patient coughed terribly. On examination today she has faint bibasilar crackles right greater than left. There is no pitting edema in her legs. She does smoke she denies any fevers chills or hemoptysis. Her blood pressure today is elevated at 150/84. She denies any chest pain. She denies any angina. She denies any myalgias or right upper quadrant pain. Her last lab work was excellent. In fact her hemoglobin A1c was 6. She does have some symptoms of hypoglycemia. These tend to occur in the mornings.  At that time, my plan was: Patient received her flu shot today. I will check a hemoglobin A1c. If her hemoglobin A1c is still near 6, I believe we should back down on the glipizide to avoid hypoglycemia. I believe we can compensate with metformin. This may also help her lose some weight. She is also been eating honey buns every day and I recommended against this. She continues to smoke and has no desire to quit smoking. Her blood pressures too high. I plan to start the patient on amlodipine but I would like to get the chest x-ray results back first. If there is evidence of pulmonary edema, and set of amlodipine, I would increase her Lasix.  I will also check a fasting lipid panel.  09/02/16 Patient continues to smoke. Her blood pressures elevated today at 164/88. She denies any chest pain shortness of breath or dyspnea on exertion. At her last visit, her hemoglobin A1c was outstanding at 5.9 and therefore I decreased her dose of glipizide. Since doing so, her fasting blood sugars range between 101 150. Her two-hour postprandial sugars range between 100-180. She denies any hypoglycemia. She denies any polyuria polydipsia or blurry vision. She denies any myalgias or right upper quadrant pain.  At that time, my plan was: Blood pressure is not well controlled. Add amlodipine 5 mg a day and recheck blood pressure in one month. Recheck hemoglobin A1c. If hemoglobin A1c is still well controlled, I will wean off glipizide and increase metformin to 1000 mg by mouth twice a day. Check fasting lipid panel. Continue to encourage the patient quit smoking.  03/03/17 Patient is here today for follow-up. She continues to take niacin in addition to Crestor. Her last HDL cholesterol was 38. Although she has dyslipidemia, we discussed  the clinical study showing no benefit in adding niacin to his statin and preventing cardiovascular events. Furthermore she has terrible side effects of the medication including nausea and flushing. She would prefer to stop it. Unfortunately she continues to smoke. Pulse oximetry today is 94% on room air. She has audible wheezing which is mild and a prolonged expiratory phase along with some rhonchorous breath sounds. We had a discussion today again about smoking cessation and I also discussed Chantix with her. However she is pre-contemplative and has no desire to quit smoking at the present time. She is also on levothyroxine for hypothyroidism. She denies any fatigue, hair loss, hot or cold intolerance. She is due to recheck a TSH. At her last visit, her blood sugar was elevated. However since adding amlodipine, her blood pressure is  much better controlled and is today 118/70. She denies any chest pain shortness of breath or dyspnea on exertion. She is also here to check her diabetes. She denies any polyuria, polydipsia, or blurry vision.  At that time, my plan was: Blood pressure is now well controlled. I will make no additional changes to her antihypertensives. Regarding her hypothyroidism, I will check a TSH to ensure adequate dosage of levothyroxine. I will check a fasting lipid panel. Her goal LDL cholesterol is less than 70. I have suggested that she discontinue niacin due to lack of clinical benefit and due to side effects. Regarding her diabetes, I will check a hemoglobin A1c. Goal hemoglobin A1c is less than 6.5. Clinically she appears to be developing or has developed COPD. She is asymptomatic although she does have diminished oxygen saturations on room air. At the time being she does not require inhalers. I continue to encourage smoking cessation.  She received her flu shot today.  09/05/17 Patient is here today for follow-up.  Unfortunately she continues to smoke and has no desire to quit.  Her review of systems however is negative.  She denies any chest pain.  She denies any shortness of breath.  She denies any dyspnea on exertion.  She denies any claudication.  Diabetic foot exam is performed today and is significant for diminished pulses bilaterally however she denies any symptoms of claudication with this.  Unfortunately she is relatively sedentary.  She is overdue for a mammogram.  She is overdue for HIV screening.  She is overdue for a Pap smear.  She is also overdue for diabetic eye exam.  She declines a Pap smear today.  She does not want me to schedule her to see an eye doctor.  She would rather schedule this herself.  She will consent to an HIV test as well as a mammogram. At that time, my plan was: I will schedule the patient for a mammogram.  I recommended a Pap smear but she declined.  She states that she will  schedule this herself in the future.  She will allow me to screen her for HIV.  She refuses a referral to an ophthalmologist.  She states that she will schedule this herself.  She is due to recheck her TSH for her hypothyroidism to ensure therapeutic dosage of her levothyroxine.  Regarding her diabetes, I will check a hemoglobin A1c.  Goal hemoglobin A1c is less than 7.  I will check a fasting lipid panel.  Goal LDL cholesterol is less than 100.  I will check a urine microalbumin.  Blood pressure today is well controlled at 126/72.  Continue to strongly encourage smoking cessation.  03/07/18 Patient is  due today for her flu shot.  She has had a Pap smear since I last saw her.  Her preventative care is up-to-date.  Unfortunately, as always, the patient continues to smoke.  I spent approximately 10 minutes today with the patient discussing the risk of COPD, cancer, and increased risk of cardiovascular disease from smoking, and I strongly encouraged her to quit.  Patient is still in the pre-contemplative phase.  She denies any chest pain.  She denies any shortness of breath.  She is relatively sedentary.  She is not getting regular exercise.  She denies any polyuria, polydipsia, or blurry vision.  She denies any neuropathy in her feet.  Diabetic foot exam is normal.  She denies any myalgias.  She denies any right upper quadrant pain.  She is due today for fasting lab work Past Medical History:  Diagnosis Date  . Arthritis    knees  . Blood transfusion 08/2007  . CHF (congestive heart failure) ([facility])   . Claudication ([facility])   . Coronary artery disease   . Diabetes mellitus   . GERD (gastroesophageal reflux disease)   . Hyperlipidemia   . Hypertension   . Hypothyroidism   . Tobacco abuse    No past surgical history on file. Current Outpatient Medications on File Prior to Visit  Medication Sig Dispense Refill  . ACCU-CHEK AVIVA PLUS test strip USE AS INSTRUCTED 300 each 3  . ACCU-CHEK SOFTCLIX LANCETS  lancets USE AS INSTRUCTED 300 each 3  . Alcohol Swabs (B-D SINGLE USE SWABS REGULAR) PADS USE AS DIRECTED 300 each 3  . amLODipine (NORVASC) 5 MG tablet TAKE 1 TABLET EVERY DAY 90 tablet 3  . aspirin 325 MG tablet Take 325 mg by mouth daily.    . Blood Glucose Calibration (ACCU-CHEK AVIVA) SOLN USE AS DIRECTED 1 each 3  . Blood Glucose Monitoring Suppl (ACCU-CHEK AVIVA PLUS) w/Device KIT Use as directed 1 kit 0  . carvedilol (COREG) 25 MG tablet Take 1 tablet (25 mg total) 2 (two) times daily with a meal by mouth. 180 tablet 3  . furosemide (LASIX) 20 MG tablet TAKE 1 TABLET EVERY DAY 90 tablet 3  . levothyroxine (SYNTHROID, LEVOTHROID) 137 MCG tablet TAKE 1 TABLET BY MOUTH ONCE DAILY BEFORE BREAKFAST 60 tablet 0  . losartan (COZAAR) 100 MG tablet TAKE 1 TABLET EVERY DAY 90 tablet 3  . metFORMIN (GLUCOPHAGE) 1000 MG tablet TAKE 1 TABLET TWICE DAILY WITH A MEAL 180 tablet 3  . nitroGLYCERIN (NITROSTAT) 0.4 MG SL tablet Place 1 tablet (0.4 mg total) under the tongue every 5 (five) minutes as needed. 100 tablet 3  . potassium chloride (K-DUR,KLOR-CON) 10 MEQ tablet Take 1 tablet (10 mEq total) daily by mouth. 90 tablet 3  . ranitidine (ZANTAC) 300 MG tablet TAKE 1 TABLET (300 MG TOTAL) AT BEDTIME. 90 tablet 3  . rosuvastatin (CRESTOR) 20 MG tablet TAKE 1 TABLET EVERY DAY 90 tablet 1   No current facility-administered medications on file prior to visit.    No Known Allergies Social History   Socioeconomic History  . Marital status: Legally Separated    Spouse name: Not on file  . Number of children: Not on file  . Years of education: Not on file  . Highest education level: Not on file  Occupational History  . Not on file  Social Needs  . Financial resource strain: Not on file  . Food insecurity:    Worry: Not on file    Inability: Not on  file  . Transportation needs:    Medical: Not on file    Non-medical: Not on file  Tobacco Use  . Smoking status: Current Every Day Smoker     Packs/day: 0.50    Years: 32.00    Pack years: 16.00    Types: Cigarettes  . Smokeless tobacco: Never Used  Substance and Sexual Activity  . Alcohol use: No  . Drug use: No  . Sexual activity: Yes  Lifestyle  . Physical activity:    Days per week: Not on file    Minutes per session: Not on file  . Stress: Not on file  Relationships  . Social connections:    Talks on phone: Not on file    Gets together: Not on file    Attends religious service: Not on file    Active member of club or organization: Not on file    Attends meetings of clubs or organizations: Not on file    Relationship status: Not on file  . Intimate partner violence:    Fear of current or ex partner: Not on file    Emotionally abused: Not on file    Physically abused: Not on file    Forced sexual activity: Not on file  Other Topics Concern  . Not on file  Social History Narrative  . Not on file     Review of Systems  All other systems reviewed and are negative.      Objective:   Physical Exam  Constitutional: She appears well-developed and well-nourished. No distress.  HENT:  Mouth/Throat: No oropharyngeal exudate.  Eyes: Conjunctivae are normal. No scleral icterus.  Neck: Neck supple. No JVD present. No thyromegaly present.  Cardiovascular: Normal rate, regular rhythm and normal heart sounds. Exam reveals no gallop and no friction rub.  No murmur heard. Pulmonary/Chest: Effort normal. No respiratory distress. She has no wheezes. She has rales.  Abdominal: Soft. Bowel sounds are normal. She exhibits no distension. There is no tenderness. There is no rebound and no guarding.  Musculoskeletal: She exhibits no edema.  Lymphadenopathy:    She has no cervical adenopathy.  Skin: She is not diaphoretic.  Vitals reviewed.         Assessment & Plan:  Hypothyroidism, unspecified type - Plan: TSH  Essential hypertension  TOBACCO ABUSE  Controlled type 2 diabetes mellitus with complication,  without long-term current use of insulin (HCC) - Plan: Hemoglobin A1c, CBC with Differential/Platelet, COMPLETE METABOLIC PANEL WITH GFR, Lipid panel, Microalbumin, urine  Other hyperlipidemia  Continue to encourage smoking cessation.  Blood pressure is well controlled.  Check TSH to ensure adequate dosage of levothyroxine.  Check hemoglobin A1c to ensure that her A1c is less than 6.5.  Check lipid panel to ensure LDL cholesterol is less than 70.  Patient received her flu shot.  Diabetic foot exam is up-to-date.  The remainder of her preventative care is up-to-date.  Encourage the patient to work with her husband on smoking cessation and to try to increase her aerobic exercise to 30 minutes a day 5 days a week gradually.

## 2018-03-08 LAB — COMPLETE METABOLIC PANEL WITH GFR
AG RATIO: 1.8 (calc) (ref 1.0–2.5)
ALT: 9 U/L (ref 6–29)
AST: 12 U/L (ref 10–35)
Albumin: 4.2 g/dL (ref 3.6–5.1)
Alkaline phosphatase (APISO): 75 U/L (ref 33–130)
BILIRUBIN TOTAL: 0.3 mg/dL (ref 0.2–1.2)
BUN: 14 mg/dL (ref 7–25)
CHLORIDE: 103 mmol/L (ref 98–110)
CO2: 24 mmol/L (ref 20–32)
Calcium: 9.5 mg/dL (ref 8.6–10.4)
Creat: 0.61 mg/dL (ref 0.50–1.05)
GFR, Est African American: 117 mL/min/{1.73_m2} (ref >=60)
GFR, Est Non African American: 101 mL/min/{1.73_m2} (ref >=60)
GLUCOSE: 116 mg/dL — AB (ref 65–99)
Globulin: 2.4 g/dL (calc) (ref 1.9–3.7)
POTASSIUM: 4.5 mmol/L (ref 3.5–5.3)
Sodium: 138 mmol/L (ref 135–146)
Total Protein: 6.6 g/dL (ref 6.1–8.1)

## 2018-03-08 LAB — CBC WITH DIFFERENTIAL/PLATELET
Basophils Absolute: 81 cells/uL (ref 0–200)
Basophils Relative: 0.9 %
EOS ABS: 90 {cells}/uL (ref 15–500)
Eosinophils Relative: 1 %
HCT: 37.7 % (ref 35.0–45.0)
Hemoglobin: 12.8 g/dL (ref 11.7–15.5)
Lymphs Abs: 1989 cells/uL (ref 850–3900)
MCH: 32.7 pg (ref 27.0–33.0)
MCHC: 34 g/dL (ref 32.0–36.0)
MCV: 96.2 fL (ref 80.0–100.0)
MONOS PCT: 5.6 %
MPV: 10.1 fL (ref 7.5–12.5)
NEUTROS PCT: 70.4 %
Neutro Abs: 6336 cells/uL (ref 1500–7800)
PLATELETS: 280 10*3/uL (ref 140–400)
RBC: 3.92 10*6/uL (ref 3.80–5.10)
RDW: 13.2 % (ref 11.0–15.0)
TOTAL LYMPHOCYTE: 22.1 %
WBC: 9 10*3/uL (ref 3.8–10.8)
WBCMIX: 504 {cells}/uL (ref 200–950)

## 2018-03-08 LAB — LIPID PANEL
CHOLESTEROL: 128 mg/dL (ref <200)
HDL: 34 mg/dL — AB (ref >50)
LDL Cholesterol (Calc): 66 mg/dL (calc)
Non-HDL Cholesterol (Calc): 94 mg/dL (calc) (ref <130)
TRIGLYCERIDES: 226 mg/dL — AB (ref <150)
Total CHOL/HDL Ratio: 3.8 (calc) (ref <5.0)

## 2018-03-08 LAB — HEMOGLOBIN A1C
Hgb A1c MFr Bld: 6.1 % of total Hgb — ABNORMAL HIGH (ref <5.7)
MEAN PLASMA GLUCOSE: 128 (calc)
eAG (mmol/L): 7.1 (calc)

## 2018-03-08 LAB — MICROALBUMIN, URINE: Microalb, Ur: 0.3 mg/dL

## 2018-03-08 LAB — TSH: TSH: 0.2 mIU/L — ABNORMAL LOW (ref 0.40–4.50)

## 2018-03-10 ENCOUNTER — Encounter: Payer: Self-pay | Admitting: *Deleted

## 2018-03-10 ENCOUNTER — Ambulatory Visit
Admission: RE | Admit: 2018-03-10 | Discharge: 2018-03-10 | Disposition: A | Discharge status: Home / Self Care | Payer: Medicare HMO | Source: Ambulatory Visit | Attending: Family Medicine | Admitting: Family Medicine

## 2018-03-10 ENCOUNTER — Other Ambulatory Visit: Payer: Self-pay | Admitting: Family Medicine

## 2018-03-10 DIAGNOSIS — M85851 Other specified disorders of bone density and structure, right thigh: Secondary | ICD-10-CM | POA: Diagnosis not present

## 2018-03-10 DIAGNOSIS — Z780 Asymptomatic menopausal state: Secondary | ICD-10-CM | POA: Diagnosis not present

## 2018-03-10 MED ORDER — LEVOTHYROXINE SODIUM 125 MCG PO TABS: 125.0000 ug | ORAL_TABLET | Freq: Every day | ORAL | 0 refills | Status: DC

## 2018-03-24 DIAGNOSIS — H5203 Hypermetropia, bilateral: Secondary | ICD-10-CM | POA: Diagnosis not present

## 2018-03-24 DIAGNOSIS — H52209 Unspecified astigmatism, unspecified eye: Secondary | ICD-10-CM | POA: Diagnosis not present

## 2018-03-24 DIAGNOSIS — H524 Presbyopia: Secondary | ICD-10-CM | POA: Diagnosis not present

## 2018-03-24 LAB — HM DIABETES EYE EXAM

## 2018-05-01 ENCOUNTER — Other Ambulatory Visit: Payer: Self-pay | Admitting: Family Medicine

## 2018-05-12 ENCOUNTER — Other Ambulatory Visit: Payer: Medicare HMO

## 2018-05-12 DIAGNOSIS — E039 Hypothyroidism, unspecified: Secondary | ICD-10-CM

## 2018-05-12 LAB — TSH: TSH: 1.04 mIU/L (ref 0.40–4.50)

## 2018-06-02 ENCOUNTER — Other Ambulatory Visit: Payer: Self-pay | Admitting: Family Medicine

## 2018-07-28 ENCOUNTER — Other Ambulatory Visit: Payer: Self-pay | Admitting: Family Medicine

## 2018-09-04 ENCOUNTER — Ambulatory Visit: Payer: Medicare HMO | Admitting: Family Medicine

## 2018-09-05 ENCOUNTER — Encounter: Payer: Self-pay | Admitting: Family Medicine

## 2018-09-05 ENCOUNTER — Other Ambulatory Visit: Payer: Self-pay

## 2018-09-05 ENCOUNTER — Ambulatory Visit (INDEPENDENT_AMBULATORY_CARE_PROVIDER_SITE_OTHER): Payer: Medicare HMO | Admitting: Family Medicine

## 2018-09-05 VITALS — BP 140/70 | HR 60 | Temp 98.4°F | Resp 18 | Ht 66.0 in | Wt 205.0 lb

## 2018-09-05 DIAGNOSIS — F17200 Nicotine dependence, unspecified, uncomplicated: Secondary | ICD-10-CM | POA: Diagnosis not present

## 2018-09-05 DIAGNOSIS — E039 Hypothyroidism, unspecified: Secondary | ICD-10-CM

## 2018-09-05 DIAGNOSIS — E118 Type 2 diabetes mellitus with unspecified complications: Secondary | ICD-10-CM | POA: Diagnosis not present

## 2018-09-05 DIAGNOSIS — I10 Essential (primary) hypertension: Secondary | ICD-10-CM | POA: Diagnosis not present

## 2018-09-05 DIAGNOSIS — E7849 Other hyperlipidemia: Secondary | ICD-10-CM | POA: Diagnosis not present

## 2018-09-05 NOTE — Progress Notes (Signed)
Subjective:    Patient ID: [name], female    DOB: 1961/04/09, 58 y.o.   MRN: [id number]  Diabetes   09/17/14 Patient is here today for followup of her multiple medical problems. She has history of hyperlipidemia. She is currently on Crestor 20 mg by mouth daily. Due to her history of cardiovascular disease as well as peripheral vascular disease, her goal LDL cholesterol is less than 70. She denies any myalgias or right upper quadrant pain. Unfortunately she continues to smoke. She has no desire to quit smoking at the present time. She is also taking Niaspan as well. Her cardiologist recommended she stop niaspan and reduce aspirin to 81 mg poqday.  Her biggest problem with niaspan is flushing. Her blood pressure is currently well controlled. She denies any chest pain short of breath or dyspnea on exertion. She is taking her beta blocker as well as her angiotensin receptor blocker for secondary prevention of cardiovascular disease. She is also currently taking metformin 500 mg by mouth daily for diabetes mellitus type 2. She denies polyuria, polydipsia, or blurred vision.  At that time, my plan was: Due to her peripheral vascular disease her history of heart disease, her diabetes, I strongly recommended the patient quit smoking. She has no desire to quit smoking at the present time. Her blood pressure is adequately controlled I did recommend that she monitor her blood pressure more closely at home and call me if her blood pressures consistently greater than 553 systolic. I will check a hemoglobin A1c as well as a urine microalbumin. I will also check a fasting lipid panel. Goal LDL cholesterol is less than 70. I told the patient I am okay with her discontinuing Niaspan if the flushing is problematic. She would like to stop that medicine at the present time.  03/04/15 Patient continues to smoke. She has no desire to quit. She is audibly wheezing today on examination. She denies any chest pain. She  denies any angina. She denies any claudication in her legs although she is extremely sedentary. She does not exercise at all. Diabetic foot exam is performed today. She has weakly palpable dorsalis pedis pulses bilaterally. She has known peripheral vascular disease with depressed ABIs on arterial Dopplers performed in 2014. She is compliant with aspirin. She is compliant with Crestor. She denies any myalgias or right upper quadrant pain. She is not regularly checking her sugars are low the last 3 mornings, her sugars have been ranging 120-140. She denies any polyuria, polydipsia, or blurred vision. She is due today for her flu shot. She is also due for diabetic eye exam although she has not scheduled this yet.  At that time, my plan was: I again strongly strongly encouraged the patient to quit smoking. I believe she has stage I COPD just based on her audible wheezing. She refuses to quit. I will check a fasting lipid panel. Based on her past medical history, her goal LDL cholesterol is less than 70, her goal HDL cholesterol is greater than 45. I will check a hemoglobin A1c. Her goal hemoglobin A1c is less than 6.5. I recommended a diabetic eye exam. She is compliant with her aspirin. Her blood pressure is controlled today. She received her flu shot  10/23/15 Patient is still smoking. She has no desire to quit at the present time. She denies any chest pain shortness of breath or dyspnea on exertion. Unfortunately her oncologist passed away. Regarding her blood sugars, she states that her blood sugars in  the morning are typically between 120 and 130. She seldom checks them later that afternoon but when she does they're typically less than 160 which sound excellent. She denies frequent hypoglycemia. Does happen rarely and occasionally but she easily recognizes it is able to adjust it. Blood pressure today is slightly elevated at 146. She is due for a recheck a TSH. She is also due for hepatitis C screening. She  denies any myalgias or right upper quadrant pain. She does report some numbness and tingling in her feet.  At that time, my plan was: Patient is due for hepatitis C screening. I will complete that today. Blood pressures elevated and I will increase losartan to 100 mg by mouth daily. Check a hemoglobin A1c. Goal hemoglobin A1c is less than 6.5. I'll also check a urine microalbumin. Regarding her hypothyroidism I'll check a TSH. I will check a fasting lipid panel. Goal LDL cholesterol is less than 70 given her history of ASCVD. She is compliant with an aspirin. Diabetic foot exam is performed today. Recommended diabetic eye exam annually   03/26/16 Patient is here today for follow-up. She denies any complaints but while I'm standing in the hallway reviewing her chart I can hear the patient coughed terribly. On examination today she has faint bibasilar crackles right greater than left. There is no pitting edema in her legs. She does smoke she denies any fevers chills or hemoptysis. Her blood pressure today is elevated at 150/84. She denies any chest pain. She denies any angina. She denies any myalgias or right upper quadrant pain. Her last lab work was excellent. In fact her hemoglobin A1c was 6. She does have some symptoms of hypoglycemia. These tend to occur in the mornings.  At that time, my plan was: Patient received her flu shot today. I will check a hemoglobin A1c. If her hemoglobin A1c is still near 6, I believe we should back down on the glipizide to avoid hypoglycemia. I believe we can compensate with metformin. This may also help her lose some weight. She is also been eating honey buns every day and I recommended against this. She continues to smoke and has no desire to quit smoking. Her blood pressures too high. I plan to start the patient on amlodipine but I would like to get the chest x-ray results back first. If there is evidence of pulmonary edema, and set of amlodipine, I would increase her Lasix.  I will also check a fasting lipid panel.  09/02/16 Patient continues to smoke. Her blood pressures elevated today at 164/88. She denies any chest pain shortness of breath or dyspnea on exertion. At her last visit, her hemoglobin A1c was outstanding at 5.9 and therefore I decreased her dose of glipizide. Since doing so, her fasting blood sugars range between 101 150. Her two-hour postprandial sugars range between 100-180. She denies any hypoglycemia. She denies any polyuria polydipsia or blurry vision. She denies any myalgias or right upper quadrant pain.  At that time, my plan was: Blood pressure is not well controlled. Add amlodipine 5 mg a day and recheck blood pressure in one month. Recheck hemoglobin A1c. If hemoglobin A1c is still well controlled, I will wean off glipizide and increase metformin to 1000 mg by mouth twice a day. Check fasting lipid panel. Continue to encourage the patient quit smoking.  03/03/17 Patient is here today for follow-up. She continues to take niacin in addition to Crestor. Her last HDL cholesterol was 38. Although she has dyslipidemia, we discussed  the clinical study showing no benefit in adding niacin to his statin and preventing cardiovascular events. Furthermore she has terrible side effects of the medication including nausea and flushing. She would prefer to stop it. Unfortunately she continues to smoke. Pulse oximetry today is 94% on room air. She has audible wheezing which is mild and a prolonged expiratory phase along with some rhonchorous breath sounds. We had a discussion today again about smoking cessation and I also discussed Chantix with her. However she is pre-contemplative and has no desire to quit smoking at the present time. She is also on levothyroxine for hypothyroidism. She denies any fatigue, hair loss, hot or cold intolerance. She is due to recheck a TSH. At her last visit, her blood sugar was elevated. However since adding amlodipine, her blood pressure is  much better controlled and is today 118/70. She denies any chest pain shortness of breath or dyspnea on exertion. She is also here to check her diabetes. She denies any polyuria, polydipsia, or blurry vision.  At that time, my plan was: Blood pressure is now well controlled. I will make no additional changes to her antihypertensives. Regarding her hypothyroidism, I will check a TSH to ensure adequate dosage of levothyroxine. I will check a fasting lipid panel. Her goal LDL cholesterol is less than 70. I have suggested that she discontinue niacin due to lack of clinical benefit and due to side effects. Regarding her diabetes, I will check a hemoglobin A1c. Goal hemoglobin A1c is less than 6.5. Clinically she appears to be developing or has developed COPD. She is asymptomatic although she does have diminished oxygen saturations on room air. At the time being she does not require inhalers. I continue to encourage smoking cessation.  She received her flu shot today.  09/05/17 Patient is here today for follow-up.  Unfortunately she continues to smoke and has no desire to quit.  Her review of systems however is negative.  She denies any chest pain.  She denies any shortness of breath.  She denies any dyspnea on exertion.  She denies any claudication.  Diabetic foot exam is performed today and is significant for diminished pulses bilaterally however she denies any symptoms of claudication with this.  Unfortunately she is relatively sedentary.  She is overdue for a mammogram.  She is overdue for HIV screening.  She is overdue for a Pap smear.  She is also overdue for diabetic eye exam.  She declines a Pap smear today.  She does not want me to schedule her to see an eye doctor.  She would rather schedule this herself.  She will consent to an HIV test as well as a mammogram. At that time, my plan was: I will schedule the patient for a mammogram.  I recommended a Pap smear but she declined.  She states that she will  schedule this herself in the future.  She will allow me to screen her for HIV.  She refuses a referral to an ophthalmologist.  She states that she will schedule this herself.  She is due to recheck her TSH for her hypothyroidism to ensure therapeutic dosage of her levothyroxine.  Regarding her diabetes, I will check a hemoglobin A1c.  Goal hemoglobin A1c is less than 7.  I will check a fasting lipid panel.  Goal LDL cholesterol is less than 100.  I will check a urine microalbumin.  Blood pressure today is well controlled at 126/72.  Continue to strongly encourage smoking cessation.  03/07/18 Patient is  due today for her flu shot.  She has had a Pap smear since I last saw her.  Her preventative care is up-to-date.  Unfortunately, as always, the patient continues to smoke.  I spent approximately 10 minutes today with the patient discussing the risk of COPD, cancer, and increased risk of cardiovascular disease from smoking, and I strongly encouraged her to quit.  Patient is still in the pre-contemplative phase.  She denies any chest pain.  She denies any shortness of breath.  She is relatively sedentary.  She is not getting regular exercise.  She denies any polyuria, polydipsia, or blurry vision.  She denies any neuropathy in her feet.  Diabetic foot exam is normal.  She denies any myalgias.  She denies any right upper quadrant pain.  She is due today for fasting lab work.  At that time, my plan was: Continue to encourage smoking cessation.  Blood pressure is well controlled.  Check TSH to ensure adequate dosage of levothyroxine.  Check hemoglobin A1c to ensure that her A1c is less than 6.5.  Check lipid panel to ensure LDL cholesterol is less than 70.  Patient received her flu shot.  Diabetic foot exam is up-to-date.  The remainder of her preventative care is up-to-date.  Encourage the patient to work with her husband on smoking cessation and to try to increase her aerobic exercise to 30 minutes a day 5 days a week  gradually.  09/05/18 Here today for follow-up.  Unfortunately she continues to smoke.  She has no desire to quit at the present time.  I strongly encouraged her to consider smoking cessation especially given the current coronavirus pandemic.  She denies any polyuria.  She denies any polydipsia, she denies any blurry vision.  She does occasionally get some burning stinging pain in her feet however this is very mild.  She is not interested in gabapentin at the present time.  Today on her physical exam I appreciate a murmur for the first time over the aortic valve.  It is very soft.  Perhaps a 1/6.  She is completely asymptomatic.  She denies any chest pain shortness of breath or dyspnea on exertion.  She is not regularly checking her sugars however she denies any symptoms of hypoglycemia.  She denies any myalgias or right upper quadrant pain.  She denies any recent weight gain.  She denies any hot flashes.  She denies any fatigue.  She recently received a letter that she can no longer take ranitidine.  However she denies any heartburn.  Therefore I recommended that she simply just stop the medication and see if she needs medication for GERD. Past Medical History:  Diagnosis Date   Arthritis    knees   Blood transfusion 08/2007   CHF (congestive heart failure) (HCC)    Claudication (HCC)    Coronary artery disease    Diabetes mellitus    GERD (gastroesophageal reflux disease)    Hyperlipidemia    Hypertension    Hypothyroidism    Tobacco abuse    No past surgical history on file. Current Outpatient Medications on File Prior to Visit  Medication Sig Dispense Refill   ACCU-CHEK AVIVA PLUS test strip USE AS INSTRUCTED 300 each 3   ACCU-CHEK SOFTCLIX LANCETS lancets USE AS INSTRUCTED 300 each 3   Alcohol Swabs (B-D SINGLE USE SWABS REGULAR) PADS USE AS DIRECTED 300 each 3   amLODipine (NORVASC) 5 MG tablet TAKE 1 TABLET EVERY DAY 90 tablet 3   aspirin 325  MG tablet Take 325 mg by  mouth daily.     Blood Glucose Calibration (ACCU-CHEK AVIVA) SOLN USE AS DIRECTED 1 each 3   Blood Glucose Monitoring Suppl (ACCU-CHEK AVIVA PLUS) w/Device KIT Use as directed 1 kit 0   carvedilol (COREG) 25 MG tablet TAKE 1 TABLET TWICE DAILY WITH A MEAL 180 tablet 0   furosemide (LASIX) 20 MG tablet TAKE 1 TABLET EVERY DAY 90 tablet 3   levothyroxine (SYNTHROID, LEVOTHROID) 125 MCG tablet TAKE 1 TABLET BY MOUTH ONCE DAILY 90 tablet 1   losartan (COZAAR) 100 MG tablet TAKE 1 TABLET EVERY DAY 90 tablet 3   metFORMIN (GLUCOPHAGE) 1000 MG tablet TAKE 1 TABLET TWICE DAILY WITH A MEAL 180 tablet 3   nitroGLYCERIN (NITROSTAT) 0.4 MG SL tablet Place 1 tablet (0.4 mg total) under the tongue every 5 (five) minutes as needed. 100 tablet 3   potassium chloride (K-DUR,KLOR-CON) 10 MEQ tablet TAKE 1 TABLET EVERY DAY 90 tablet 0   ranitidine (ZANTAC) 300 MG tablet TAKE 1 TABLET AT BEDTIME 90 tablet 0   rosuvastatin (CRESTOR) 20 MG tablet TAKE 1 TABLET EVERY DAY 90 tablet 0   No current facility-administered medications on file prior to visit.    No Known Allergies Social History   Socioeconomic History   Marital status: Legally Separated    Spouse name: Not on file   Number of children: Not on file   Years of education: Not on file   Highest education level: Not on file  Occupational History   Not on file  Social Needs   Financial resource strain: Not on file   Food insecurity:    Worry: Not on file    Inability: Not on file   Transportation needs:    Medical: Not on file    Non-medical: Not on file  Tobacco Use   Smoking status: Current Every Day Smoker    Packs/day: 0.50    Years: 32.00    Pack years: 16.00    Types: Cigarettes   Smokeless tobacco: Never Used  Substance and Sexual Activity   Alcohol use: No   Drug use: No   Sexual activity: Yes  Lifestyle   Physical activity:    Days per week: Not on file    Minutes per session: Not on file   Stress:  Not on file  Relationships   Social connections:    Talks on phone: Not on file    Gets together: Not on file    Attends religious service: Not on file    Active member of club or organization: Not on file    Attends meetings of clubs or organizations: Not on file    Relationship status: Not on file   Intimate partner violence:    Fear of current or ex partner: Not on file    Emotionally abused: Not on file    Physically abused: Not on file    Forced sexual activity: Not on file  Other Topics Concern   Not on file  Social History Narrative   Not on file     Review of Systems  All other systems reviewed and are negative.      Objective:   Physical Exam  Constitutional: She appears well-developed and well-nourished. No distress.  HENT:  Mouth/Throat: No oropharyngeal exudate.  Eyes: Conjunctivae are normal. No scleral icterus.  Neck: Neck supple. No JVD present. No thyromegaly present.  Cardiovascular: Normal rate and regular rhythm. Exam reveals no gallop and no friction  rub.  Murmur heard. Pulmonary/Chest: Effort normal. No respiratory distress. She has no wheezes. She has no rales.  Abdominal: Soft. Bowel sounds are normal. She exhibits no distension. There is no abdominal tenderness. There is no rebound and no guarding.  Musculoskeletal:        General: No edema.  Lymphadenopathy:    She has no cervical adenopathy.  Skin: She is not diaphoretic.  Vitals reviewed.         Assessment & Plan:  Essential hypertension  TOBACCO ABUSE  Controlled type 2 diabetes mellitus with complication, without long-term current use of insulin (HCC) - Plan: Hemoglobin A1c, COMPLETE METABOLIC PANEL WITH GFR, CBC with Differential/Platelet, Lipid panel, Microalbumin, urine  Other hyperlipidemia  Hypothyroidism, unspecified type - Plan: TSH  Patient's blood pressure today is acceptable.  I will not make any changes in her antihypertensives.  She continues to smoke and I  strongly encouraged her to quit smoking.  I suspect the murmur is likely aortic stenosis developing with age.  At the present time the patient is asymptomatic and the murmur is very soft.  Therefore I will not perform an echocardiogram.  However should she develop symptoms or if the murmur worsens, I would proceed with an echocardiogram.  I will check a TSH to ensure adequate treatment of her hypothyroidism.  I will check hemoglobin A1c to monitor the management of her diabetes.  Her goal hemoglobin A1c is less than 6.5.  I will also check a fasting lipid panel.  Ideally I like her LDL cholesterol to be below 100.  Monitor for diabetic nephropathy with a microalbumin.  Otherwise her preventative care is up-to-date.  Regular anticipatory guidance is provided.  More than 25 minutes were spent today with the patient in examination and discussion.

## 2018-09-06 LAB — HEMOGLOBIN A1C
Hgb A1c MFr Bld: 5.9 % of total Hgb — ABNORMAL HIGH (ref <5.7)
Mean Plasma Glucose: 123 (calc)
eAG (mmol/L): 6.8 (calc)

## 2018-09-06 LAB — CBC WITH DIFFERENTIAL/PLATELET
Absolute Monocytes: 593 cells/uL (ref 200–950)
Basophils Absolute: 69 cells/uL (ref 0–200)
Basophils Relative: 0.8 %
Eosinophils Absolute: 120 cells/uL (ref 15–500)
Eosinophils Relative: 1.4 %
HCT: 38 % (ref 35.0–45.0)
Hemoglobin: 13 g/dL (ref 11.7–15.5)
Lymphs Abs: 2055 cells/uL (ref 850–3900)
MCH: 33 pg (ref 27.0–33.0)
MCHC: 34.2 g/dL (ref 32.0–36.0)
MCV: 96.4 fL (ref 80.0–100.0)
MPV: 10.6 fL (ref 7.5–12.5)
Monocytes Relative: 6.9 %
Neutro Abs: 5762 cells/uL (ref 1500–7800)
Neutrophils Relative %: 67 %
Platelets: 273 10*3/uL (ref 140–400)
RBC: 3.94 10*6/uL (ref 3.80–5.10)
RDW: 12.4 % (ref 11.0–15.0)
Total Lymphocyte: 23.9 %
WBC: 8.6 10*3/uL (ref 3.8–10.8)

## 2018-09-06 LAB — LIPID PANEL
Cholesterol: 124 mg/dL (ref <200)
HDL: 36 mg/dL — ABNORMAL LOW (ref >=50)
LDL Cholesterol (Calc): 68 mg/dL (calc)
Non-HDL Cholesterol (Calc): 88 mg/dL (calc) (ref <130)
Total CHOL/HDL Ratio: 3.4 (calc) (ref <5.0)
Triglycerides: 115 mg/dL (ref <150)

## 2018-09-06 LAB — COMPLETE METABOLIC PANEL WITH GFR
AG Ratio: 1.6 (calc) (ref 1.0–2.5)
ALT: 7 U/L (ref 6–29)
AST: 17 U/L (ref 10–35)
Albumin: 4.2 g/dL (ref 3.6–5.1)
Alkaline phosphatase (APISO): 73 U/L (ref 37–153)
BUN: 9 mg/dL (ref 7–25)
CO2: 26 mmol/L (ref 20–32)
Calcium: 9.6 mg/dL (ref 8.6–10.4)
Chloride: 103 mmol/L (ref 98–110)
Creat: 0.59 mg/dL (ref 0.50–1.05)
GFR, Est African American: 118 mL/min/{1.73_m2} (ref >=60)
GFR, Est Non African American: 102 mL/min/{1.73_m2} (ref >=60)
Globulin: 2.6 g/dL (calc) (ref 1.9–3.7)
Glucose, Bld: 97 mg/dL (ref 65–99)
Potassium: 4.8 mmol/L (ref 3.5–5.3)
Sodium: 139 mmol/L (ref 135–146)
Total Bilirubin: 0.4 mg/dL (ref 0.2–1.2)
Total Protein: 6.8 g/dL (ref 6.1–8.1)

## 2018-09-06 LAB — MICROALBUMIN, URINE: Microalb, Ur: 0.6 mg/dL

## 2018-09-06 LAB — TSH: TSH: 0.36 mIU/L — ABNORMAL LOW (ref 0.40–4.50)

## 2018-09-07 ENCOUNTER — Other Ambulatory Visit: Payer: Self-pay

## 2018-09-07 MED ORDER — LEVOTHYROXINE SODIUM 112 MCG PO TABS: 112.0000 ug | ORAL_TABLET | Freq: Every day | ORAL | 5 refills | Status: DC

## 2018-09-12 ENCOUNTER — Telehealth: Payer: Self-pay | Admitting: *Deleted

## 2018-09-12 MED ORDER — OMEPRAZOLE 40 MG PO CPDR: 40.0000 mg | DELAYED_RELEASE_CAPSULE | Freq: Every day | ORAL | 3 refills | Status: DC

## 2018-09-12 NOTE — Telephone Encounter (Signed)
Could switch to omeprazole 40 mg a day.

## 2018-09-12 NOTE — Telephone Encounter (Signed)
Received fax requesting alternative to Zantac as medication is no longer available on market.   Reports that alternative therpaies include: Omeprazole Pantoprazole Esomeprazole  MD please advise.

## 2018-09-12 NOTE — Telephone Encounter (Signed)
Prescription sent to pharmacy.   Letter sent.  

## 2018-10-31 ENCOUNTER — Other Ambulatory Visit: Payer: Self-pay | Admitting: Family Medicine

## 2018-11-03 ENCOUNTER — Other Ambulatory Visit: Payer: Self-pay | Admitting: Family Medicine

## 2018-11-06 MED ORDER — ROSUVASTATIN CALCIUM 20 MG PO TABS: 20.0000 mg | ORAL_TABLET | Freq: Every day | ORAL | 0 refills | Status: DC

## 2018-11-06 MED ORDER — POTASSIUM CHLORIDE CRYS ER 10 MEQ PO TBCR: 10.0000 meq | EXTENDED_RELEASE_TABLET | Freq: Every day | ORAL | 0 refills | Status: DC

## 2018-11-06 MED ORDER — CARVEDILOL 25 MG PO TABS: 25.0000 mg | ORAL_TABLET | Freq: Two times a day (BID) | ORAL | 0 refills | Status: DC

## 2018-11-14 ENCOUNTER — Other Ambulatory Visit: Payer: Self-pay | Admitting: Family Medicine

## 2019-01-25 ENCOUNTER — Other Ambulatory Visit: Payer: Self-pay | Admitting: Family Medicine

## 2019-03-05 ENCOUNTER — Other Ambulatory Visit: Payer: Self-pay

## 2019-03-05 ENCOUNTER — Other Ambulatory Visit: Payer: Medicare HMO

## 2019-03-05 DIAGNOSIS — E7849 Other hyperlipidemia: Secondary | ICD-10-CM

## 2019-03-05 DIAGNOSIS — F17200 Nicotine dependence, unspecified, uncomplicated: Secondary | ICD-10-CM | POA: Diagnosis not present

## 2019-03-05 DIAGNOSIS — I10 Essential (primary) hypertension: Secondary | ICD-10-CM

## 2019-03-05 DIAGNOSIS — E118 Type 2 diabetes mellitus with unspecified complications: Secondary | ICD-10-CM

## 2019-03-05 DIAGNOSIS — E039 Hypothyroidism, unspecified: Secondary | ICD-10-CM

## 2019-03-06 LAB — HEMOGLOBIN A1C
Hgb A1c MFr Bld: 5.8 % of total Hgb — ABNORMAL HIGH (ref <5.7)
Mean Plasma Glucose: 120 (calc)
eAG (mmol/L): 6.6 (calc)

## 2019-03-06 LAB — LIPID PANEL
Cholesterol: 126 mg/dL (ref <200)
HDL: 36 mg/dL — ABNORMAL LOW (ref >=50)
LDL Cholesterol (Calc): 67 mg/dL (calc)
Non-HDL Cholesterol (Calc): 90 mg/dL (calc) (ref <130)
Total CHOL/HDL Ratio: 3.5 (calc) (ref <5.0)
Triglycerides: 143 mg/dL (ref <150)

## 2019-03-06 LAB — COMPREHENSIVE METABOLIC PANEL
AG Ratio: 1.8 (calc) (ref 1.0–2.5)
ALT: 8 U/L (ref 6–29)
AST: 12 U/L (ref 10–35)
Albumin: 4.1 g/dL (ref 3.6–5.1)
Alkaline phosphatase (APISO): 68 U/L (ref 37–153)
BUN: 16 mg/dL (ref 7–25)
CO2: 24 mmol/L (ref 20–32)
Calcium: 9.3 mg/dL (ref 8.6–10.4)
Chloride: 104 mmol/L (ref 98–110)
Creat: 0.67 mg/dL (ref 0.50–1.05)
Globulin: 2.3 g/dL (calc) (ref 1.9–3.7)
Glucose, Bld: 107 mg/dL — ABNORMAL HIGH (ref 65–99)
Potassium: 4.7 mmol/L (ref 3.5–5.3)
Sodium: 138 mmol/L (ref 135–146)
Total Bilirubin: 0.4 mg/dL (ref 0.2–1.2)
Total Protein: 6.4 g/dL (ref 6.1–8.1)

## 2019-03-06 LAB — CBC WITH DIFFERENTIAL/PLATELET
Absolute Monocytes: 538 cells/uL (ref 200–950)
Basophils Absolute: 59 cells/uL (ref 0–200)
Basophils Relative: 0.7 %
Eosinophils Absolute: 151 cells/uL (ref 15–500)
Eosinophils Relative: 1.8 %
HCT: 37.5 % (ref 35.0–45.0)
Hemoglobin: 12.6 g/dL (ref 11.7–15.5)
Lymphs Abs: 2134 cells/uL (ref 850–3900)
MCH: 33.3 pg — ABNORMAL HIGH (ref 27.0–33.0)
MCHC: 33.6 g/dL (ref 32.0–36.0)
MCV: 99.2 fL (ref 80.0–100.0)
MPV: 10.6 fL (ref 7.5–12.5)
Monocytes Relative: 6.4 %
Neutro Abs: 5519 cells/uL (ref 1500–7800)
Neutrophils Relative %: 65.7 %
Platelets: 261 10*3/uL (ref 140–400)
RBC: 3.78 10*6/uL — ABNORMAL LOW (ref 3.80–5.10)
RDW: 13.6 % (ref 11.0–15.0)
Total Lymphocyte: 25.4 %
WBC: 8.4 10*3/uL (ref 3.8–10.8)

## 2019-03-06 LAB — TSH: TSH: 2.03 mIU/L (ref 0.40–4.50)

## 2019-03-08 ENCOUNTER — Other Ambulatory Visit: Payer: Self-pay

## 2019-03-08 ENCOUNTER — Telehealth (HOSPITAL_COMMUNITY): Payer: Self-pay | Admitting: *Deleted

## 2019-03-08 ENCOUNTER — Encounter: Payer: Self-pay | Admitting: Family Medicine

## 2019-03-08 ENCOUNTER — Ambulatory Visit (INDEPENDENT_AMBULATORY_CARE_PROVIDER_SITE_OTHER): Payer: Medicare HMO | Admitting: Family Medicine

## 2019-03-08 VITALS — BP 110/60 | HR 60 | Temp 97.6°F | Resp 18 | Ht 66.0 in | Wt 209.0 lb

## 2019-03-08 DIAGNOSIS — F17200 Nicotine dependence, unspecified, uncomplicated: Secondary | ICD-10-CM | POA: Diagnosis not present

## 2019-03-08 DIAGNOSIS — R0989 Other specified symptoms and signs involving the circulatory and respiratory systems: Secondary | ICD-10-CM | POA: Diagnosis not present

## 2019-03-08 DIAGNOSIS — E039 Hypothyroidism, unspecified: Secondary | ICD-10-CM | POA: Diagnosis not present

## 2019-03-08 DIAGNOSIS — Z23 Encounter for immunization: Secondary | ICD-10-CM

## 2019-03-08 DIAGNOSIS — I10 Essential (primary) hypertension: Secondary | ICD-10-CM

## 2019-03-08 DIAGNOSIS — E7849 Other hyperlipidemia: Secondary | ICD-10-CM | POA: Diagnosis not present

## 2019-03-08 DIAGNOSIS — E118 Type 2 diabetes mellitus with unspecified complications: Secondary | ICD-10-CM | POA: Diagnosis not present

## 2019-03-08 NOTE — Progress Notes (Signed)
Subjective:    Patient ID: [name], female    DOB: 08-10-1960, 58 y.o.   MRN: [id number]  Patient is here today to recheck her chronic medical conditions.  Her blood pressure is well controlled at 110/60.  She denies any chest pain shortness of breath or dyspnea on exertion.  She denies any depression.  However she continues to smoke.  I have strongly recommended smoking cessation on every visit.  I continue to encourage the patient to quit smoking however she is in the pre-contemplative phase.  She denies any polyuria, polydipsia, or blurry vision.  She occasionally gets some numbness and tingling in her feet however this is not consistent.  Diabetic foot exam today is significant only for decreased pedal pulses bilaterally.  They are palpable however they are extremely weak.  She denies any claudication.  She denies any myalgias or right upper quadrant pain.  She is due for her flu shot. Past Medical History:  Diagnosis Date  . Arthritis    knees  . Blood transfusion 08/2007  . CHF (congestive heart failure) ([place])   . Claudication ([place])   . Coronary artery disease   . Diabetes mellitus   . GERD (gastroesophageal reflux disease)   . Hyperlipidemia   . Hypertension   . Hypothyroidism   . Tobacco abuse    No past surgical history on file. Current Outpatient Medications on File Prior to Visit  Medication Sig Dispense Refill  . ACCU-CHEK AVIVA PLUS test strip USE AS INSTRUCTED 300 strip 3  . Accu-Chek Softclix Lancets lancets USE AS INSTRUCTED 300 each 3  . Alcohol Swabs (B-D SINGLE USE SWABS REGULAR) PADS USE AS DIRECTED 300 each 3  . amLODipine (NORVASC) 5 MG tablet TAKE 1 TABLET EVERY DAY 90 tablet 3  . aspirin 325 MG tablet Take 325 mg by mouth daily.    . Blood Glucose Calibration (ACCU-CHEK AVIVA) SOLN USE AS DIRECTED 1 each 3  . Blood Glucose Monitoring Suppl (ACCU-CHEK AVIVA PLUS) w/Device KIT Use as directed 1 kit 0  . carvedilol (COREG) 25 MG tablet Take 1 tablet (25 mg  total) by mouth 2 (two) times daily with a meal. 180 tablet 0  . carvedilol (COREG) 25 MG tablet Take 1 tablet (25 mg total) by mouth 2 (two) times daily with a meal. 180 tablet 0  . carvedilol (COREG) 25 MG tablet TAKE 1 TABLET TWICE DAILY WITH MEALS 180 tablet 3  . furosemide (LASIX) 20 MG tablet TAKE 1 TABLET EVERY DAY 90 tablet 3  . levothyroxine (SYNTHROID, LEVOTHROID) 112 MCG tablet Take 1 tablet (112 mcg total) by mouth daily before breakfast. 30 tablet 5  . losartan (COZAAR) 100 MG tablet TAKE 1 TABLET EVERY DAY 90 tablet 3  . metFORMIN (GLUCOPHAGE) 1000 MG tablet TAKE 1 TABLET TWICE DAILY WITH A MEAL 180 tablet 3  . nitroGLYCERIN (NITROSTAT) 0.4 MG SL tablet Place 1 tablet (0.4 mg total) under the tongue every 5 (five) minutes as needed. 100 tablet 3  . Omega-3 Fatty Acids (FISH OIL) 1000 MG CAPS Take by mouth.    Marland Kitchen omeprazole (PRILOSEC) 40 MG capsule Take 1 capsule (40 mg total) by mouth daily. 90 capsule 3  . potassium chloride (K-DUR) 10 MEQ tablet Take 1 tablet (10 mEq total) by mouth daily. 90 tablet 0  . potassium chloride (K-DUR) 10 MEQ tablet Take 1 tablet (10 mEq total) by mouth daily. 90 tablet 0  . potassium chloride (K-DUR) 10 MEQ tablet TAKE 1 TABLET  EVERY DAY 90 tablet 3  . rosuvastatin (CRESTOR) 20 MG tablet Take 1 tablet (20 mg total) by mouth daily. 90 tablet 0  . rosuvastatin (CRESTOR) 20 MG tablet Take 1 tablet (20 mg total) by mouth daily. 90 tablet 0  . rosuvastatin (CRESTOR) 20 MG tablet TAKE 1 TABLET EVERY DAY 90 tablet 3   No current facility-administered medications on file prior to visit.    No Known Allergies Social History   Socioeconomic History  . Marital status: Legally Separated    Spouse name: Not on file  . Number of children: Not on file  . Years of education: Not on file  . Highest education level: Not on file  Occupational History  . Not on file  Social Needs  . Financial resource strain: Not on file  . Food insecurity    Worry: Not on  file    Inability: Not on file  . Transportation needs    Medical: Not on file    Non-medical: Not on file  Tobacco Use  . Smoking status: Current Every Day Smoker    Packs/day: 0.50    Years: 32.00    Pack years: 16.00    Types: Cigarettes  . Smokeless tobacco: Never Used  Substance and Sexual Activity  . Alcohol use: No  . Drug use: No  . Sexual activity: Yes  Lifestyle  . Physical activity    Days per week: Not on file    Minutes per session: Not on file  . Stress: Not on file  Relationships  . Social Herbalist on phone: Not on file    Gets together: Not on file    Attends religious service: Not on file    Active member of club or organization: Not on file    Attends meetings of clubs or organizations: Not on file    Relationship status: Not on file  . Intimate partner violence    Fear of current or ex partner: Not on file    Emotionally abused: Not on file    Physically abused: Not on file    Forced sexual activity: Not on file  Other Topics Concern  . Not on file  Social History Narrative  . Not on file     Review of Systems  All other systems reviewed and are negative.      Objective:   Physical Exam  Constitutional: She appears well-developed and well-nourished. No distress.  HENT:  Mouth/Throat: No oropharyngeal exudate.  Eyes: Conjunctivae are normal. No scleral icterus.  Neck: Neck supple. No JVD present. No thyromegaly present.  Cardiovascular: Normal rate and regular rhythm. Exam reveals no gallop and no friction rub.  Murmur heard. Pulmonary/Chest: Effort normal. No respiratory distress. She has no wheezes. She has no rales.  Abdominal: Soft. Bowel sounds are normal. She exhibits no distension. There is no abdominal tenderness. There is no rebound and no guarding.  Musculoskeletal:        General: No edema.  Lymphadenopathy:    She has no cervical adenopathy.  Skin: She is not diaphoretic.  Vitals reviewed.          Assessment & Plan:  Essential hypertension  TOBACCO ABUSE  Controlled type 2 diabetes mellitus with complication, without long-term current use of insulin (HCC)  Hypothyroidism, unspecified type  Other hyperlipidemia  Decreased pedal pulses - Plan: VAS [place] ABI WITH/WO TBI  Patient's blood pressure is outstanding.  I will make no changes in her medications at this  time.  Strongly encourage the patient to quit smoking.  Patient is in the pre-contemplative phase.  TSH is within normal limits.  We will make no changes in her levothyroxine dose.  Her hemoglobin A1c is outstanding at 5.8.  Make no changes in her metformin.  Her LDL cholesterol is below 70.  Therefore I will not make any changes to her Crestor.  However given her decreased pedal pulses, I have recommended lower extremity arterial Dopplers with ABIs to evaluate further.  Continue aspirin 81 mg daily

## 2019-03-08 NOTE — Telephone Encounter (Addendum)
03/09/19 8:20 am left VM asking pt to return my call 03/08/19 8:30 am VM left asking pt to return my call

## 2019-03-08 NOTE — Addendum Note (Signed)
Addended by: Shary Decamp B on: 03/08/2019 10:46 AM   Modules accepted: Orders

## 2019-04-03 ENCOUNTER — Telehealth (HOSPITAL_COMMUNITY): Payer: Self-pay | Admitting: *Deleted

## 2019-04-03 NOTE — Telephone Encounter (Signed)

## 2019-04-04 ENCOUNTER — Other Ambulatory Visit: Payer: Self-pay

## 2019-04-04 ENCOUNTER — Ambulatory Visit (HOSPITAL_COMMUNITY)
Admission: RE | Admit: 2019-04-04 | Discharge: 2019-04-04 | Disposition: A | Discharge status: Home / Self Care | Payer: Medicare HMO | Source: Ambulatory Visit | Attending: Family Medicine | Admitting: Family Medicine

## 2019-04-04 DIAGNOSIS — R0989 Other specified symptoms and signs involving the circulatory and respiratory systems: Secondary | ICD-10-CM | POA: Diagnosis not present

## 2019-04-05 ENCOUNTER — Encounter: Payer: Self-pay | Admitting: Family Medicine

## 2019-04-05 DIAGNOSIS — I739 Peripheral vascular disease, unspecified: Secondary | ICD-10-CM | POA: Insufficient documentation

## 2019-08-12 ENCOUNTER — Other Ambulatory Visit: Payer: Self-pay | Admitting: Family Medicine

## 2019-09-10 ENCOUNTER — Other Ambulatory Visit: Payer: Self-pay | Admitting: Family Medicine

## 2019-09-10 DIAGNOSIS — Z1231 Encounter for screening mammogram for malignant neoplasm of breast: Secondary | ICD-10-CM

## 2019-10-25 ENCOUNTER — Other Ambulatory Visit: Payer: Self-pay | Admitting: Family Medicine

## 2019-11-13 ENCOUNTER — Encounter: Payer: Self-pay | Admitting: Family Medicine

## 2019-11-13 ENCOUNTER — Other Ambulatory Visit: Payer: Self-pay

## 2019-11-13 ENCOUNTER — Ambulatory Visit: Payer: Medicare HMO | Admitting: Family Medicine

## 2019-11-13 VITALS — BP 160/72 | HR 63 | Temp 98.5°F | Ht 66.0 in | Wt 205.0 lb

## 2019-11-13 DIAGNOSIS — I2510 Atherosclerotic heart disease of native coronary artery without angina pectoris: Secondary | ICD-10-CM | POA: Diagnosis not present

## 2019-11-13 DIAGNOSIS — E118 Type 2 diabetes mellitus with unspecified complications: Secondary | ICD-10-CM | POA: Diagnosis not present

## 2019-11-13 DIAGNOSIS — E039 Hypothyroidism, unspecified: Secondary | ICD-10-CM

## 2019-11-13 DIAGNOSIS — E7849 Other hyperlipidemia: Secondary | ICD-10-CM | POA: Diagnosis not present

## 2019-11-13 DIAGNOSIS — F17200 Nicotine dependence, unspecified, uncomplicated: Secondary | ICD-10-CM | POA: Diagnosis not present

## 2019-11-13 DIAGNOSIS — I739 Peripheral vascular disease, unspecified: Secondary | ICD-10-CM

## 2019-11-13 DIAGNOSIS — I10 Essential (primary) hypertension: Secondary | ICD-10-CM | POA: Diagnosis not present

## 2019-11-13 MED ORDER — AMLODIPINE BESYLATE 10 MG PO TABS: 10.0000 mg | ORAL_TABLET | Freq: Every day | ORAL | 3 refills | Status: DC

## 2019-11-13 NOTE — Progress Notes (Signed)
Subjective:    Patient ID: [name], female    DOB: 30-Jun-1960, 59 y.o.   MRN: [id number]  At last visit, decreased pedal pulses were observed.  Arterial dopplers revealed:  Right: Resting right ankle-brachial index is within normal range. No  evidence of significant right lower extremity arterial disease. The right  toe-brachial index is normal.   Left: Resting left ankle-brachial index indicates moderate left lower  extremity arterial disease. The left toe-brachial index is abnormal.   Reviewing her past records, the patient had a catheterization in 2009 that showed a 50 to 60% blockage in her LAD as well as a 50 to 60% blockage in her left circumflex.  Her most recent echocardiogram showed a normal ejection fraction.  She denies any shortness of breath or dyspnea on exertion.  She denies any angina.  She denies any claudication in her left leg.  She is taking an aspirin every day.  However her blood pressure is elevated today and I confirmed this myself.  She is also continuing to smoke.  She denies any polyuria or polydipsia.  She denies any blurry vision.  She has a diabetic eye exam scheduled later this fall.  The remainder of her preventative care is up-to-date including her Covid vaccination. Past Medical History:  Diagnosis Date  . Arthritis    knees  . Blood transfusion 08/2007  . CHF (congestive heart failure) ([place])   . Claudication ([place])   . Coronary artery disease   . Diabetes mellitus   . GERD (gastroesophageal reflux disease)   . Hyperlipidemia   . Hypertension   . Hypothyroidism   . PVD (peripheral vascular disease) ([place])   . Tobacco abuse    No past surgical history on file. Current Outpatient Medications on File Prior to Visit  Medication Sig Dispense Refill  . ACCU-CHEK AVIVA PLUS test strip USE AS INSTRUCTED 300 strip 3  . Accu-Chek Softclix Lancets lancets USE AS INSTRUCTED 300 each 3  . Alcohol Swabs (B-D SINGLE USE SWABS REGULAR) PADS USE AS DIRECTED  300 each 3  . amLODipine (NORVASC) 5 MG tablet TAKE 1 TABLET EVERY DAY 90 tablet 3  . aspirin 325 MG tablet Take 325 mg by mouth daily.    . Blood Glucose Calibration (ACCU-CHEK AVIVA) SOLN USE AS DIRECTED 1 each 3  . Blood Glucose Monitoring Suppl (ACCU-CHEK AVIVA PLUS) w/Device KIT Use as directed 1 kit 0  . carvedilol (COREG) 25 MG tablet TAKE 1 TABLET TWICE DAILY WITH MEALS 180 tablet 3  . furosemide (LASIX) 20 MG tablet TAKE 1 TABLET EVERY DAY 90 tablet 3  . levothyroxine (SYNTHROID, LEVOTHROID) 112 MCG tablet Take 1 tablet (112 mcg total) by mouth daily before breakfast. 30 tablet 5  . losartan (COZAAR) 100 MG tablet TAKE 1 TABLET EVERY DAY 90 tablet 3  . metFORMIN (GLUCOPHAGE) 1000 MG tablet TAKE 1 TABLET TWICE DAILY WITH MEALS 180 tablet 3  . nitroGLYCERIN (NITROSTAT) 0.4 MG SL tablet Place 1 tablet (0.4 mg total) under the tongue every 5 (five) minutes as needed. 100 tablet 3  . Omega-3 Fatty Acids (FISH OIL) 1000 MG CAPS Take by mouth.    Marland Kitchen omeprazole (PRILOSEC) 40 MG capsule TAKE 1 CAPSULE EVERY DAY 90 capsule 3  . potassium chloride (K-DUR) 10 MEQ tablet Take 1 tablet (10 mEq total) by mouth daily. 90 tablet 0  . rosuvastatin (CRESTOR) 20 MG tablet Take 1 tablet (20 mg total) by mouth daily. 90 tablet 0   No current  facility-administered medications on file prior to visit.   No Known Allergies Social History   Socioeconomic History  . Marital status: Legally Separated    Spouse name: Not on file  . Number of children: Not on file  . Years of education: Not on file  . Highest education level: Not on file  Occupational History  . Not on file  Tobacco Use  . Smoking status: Current Every Day Smoker    Packs/day: 0.50    Years: 32.00    Pack years: 16.00    Types: Cigarettes  . Smokeless tobacco: Never Used  Vaping Use  . Vaping Use: Never used  Substance and Sexual Activity  . Alcohol use: No  . Drug use: No  . Sexual activity: Yes  Other Topics Concern  . Not on  file  Social History Narrative  . Not on file   Social Determinants of Health   Financial Resource Strain:   . Difficulty of Paying Living Expenses:   Food Insecurity:   . Worried About Charity fundraiser in the Last Year:   . Arboriculturist in the Last Year:   Transportation Needs:   . Film/video editor (Medical):   Marland Kitchen Lack of Transportation (Non-Medical):   Physical Activity:   . Days of Exercise per Week:   . Minutes of Exercise per Session:   Stress:   . Feeling of Stress :   Social Connections:   . Frequency of Communication with Friends and Family:   . Frequency of Social Gatherings with Friends and Family:   . Attends Religious Services:   . Active Member of Clubs or Organizations:   . Attends Archivist Meetings:   Marland Kitchen Marital Status:   Intimate Partner Violence:   . Fear of Current or Ex-Partner:   . Emotionally Abused:   Marland Kitchen Physically Abused:   . Sexually Abused:      Review of Systems  All other systems reviewed and are negative.      Objective:   Physical Exam Vitals reviewed.  Constitutional:      General: She is not in acute distress.    Appearance: She is well-developed. She is not diaphoretic.  HENT:     Mouth/Throat:     Pharynx: No oropharyngeal exudate.  Eyes:     General: No scleral icterus.    Conjunctiva/sclera: Conjunctivae normal.  Neck:     Thyroid: No thyromegaly.     Vascular: No JVD.  Cardiovascular:     Rate and Rhythm: Normal rate and regular rhythm.     Heart sounds: Murmur heard.  No friction rub. No gallop.   Pulmonary:     Effort: Pulmonary effort is normal. No respiratory distress.     Breath sounds: No wheezing or rales.  Abdominal:     General: Bowel sounds are normal. There is no distension.     Palpations: Abdomen is soft.     Tenderness: There is no abdominal tenderness. There is no guarding or rebound.  Musculoskeletal:     Cervical back: Neck supple.  Lymphadenopathy:     Cervical: No cervical  adenopathy.           Assessment & Plan:  Essential hypertension  TOBACCO ABUSE  Controlled type 2 diabetes mellitus with complication, without long-term current use of insulin ([place]) - Plan: CBC with Differential/Platelet, COMPLETE METABOLIC PANEL WITH GFR, Lipid panel, Microalbumin, urine, Hemoglobin A1c  Hypothyroidism, unspecified type - Plan: TSH  Other hyperlipidemia  PVD (peripheral vascular disease) ([place])  CAD in native artery  At the present time, the patient is asymptomatic regarding her coronary artery disease and peripheral vascular disease.  She has no claudication and she has no angina.  However I spent our entire visit today emphasizing the importance for smoking cessation.  This is her biggest uncontrolled risk factor that could cause progression of her arterial disease.  I explained the risk of stroke and heart attack and limb threatening ischemia.  Patient will monitor for any of the symptoms.  Will increase her amlodipine to 10 mg a day and recheck her blood pressure in 2 weeks.  Check a TSH to monitor her hypothyroidism.  Check a fasting lipid panel.  Goal LDL cholesterol is less than 70.  Check a hemoglobin A1c.  Goal hemoglobin A1c is less than 6.5.  Strongly encourage smoking cessation and discussed strategies including Chantix.  Patient is in the precontemplative phase and is not ready to quit.

## 2019-11-14 LAB — COMPLETE METABOLIC PANEL WITH GFR
AG Ratio: 1.6 (calc) (ref 1.0–2.5)
ALT: 9 U/L (ref 6–29)
AST: 11 U/L (ref 10–35)
Albumin: 4.2 g/dL (ref 3.6–5.1)
Alkaline phosphatase (APISO): 72 U/L (ref 37–153)
BUN: 14 mg/dL (ref 7–25)
CO2: 25 mmol/L (ref 20–32)
Calcium: 9.6 mg/dL (ref 8.6–10.4)
Chloride: 105 mmol/L (ref 98–110)
Creat: 0.53 mg/dL (ref 0.50–1.05)
GFR, Est African American: 121 mL/min/{1.73_m2} (ref >=60)
GFR, Est Non African American: 105 mL/min/{1.73_m2} (ref >=60)
Globulin: 2.6 g/dL (calc) (ref 1.9–3.7)
Glucose, Bld: 109 mg/dL — ABNORMAL HIGH (ref 65–99)
Potassium: 4.4 mmol/L (ref 3.5–5.3)
Sodium: 139 mmol/L (ref 135–146)
Total Bilirubin: 0.4 mg/dL (ref 0.2–1.2)
Total Protein: 6.8 g/dL (ref 6.1–8.1)

## 2019-11-14 LAB — LIPID PANEL
Cholesterol: 119 mg/dL (ref <200)
HDL: 35 mg/dL — ABNORMAL LOW (ref >=50)
LDL Cholesterol (Calc): 62 mg/dL (calc)
Non-HDL Cholesterol (Calc): 84 mg/dL (calc) (ref <130)
Total CHOL/HDL Ratio: 3.4 (calc) (ref <5.0)
Triglycerides: 138 mg/dL (ref <150)

## 2019-11-14 LAB — HEMOGLOBIN A1C
Hgb A1c MFr Bld: 5.9 % of total Hgb — ABNORMAL HIGH (ref <5.7)
Mean Plasma Glucose: 123 (calc)
eAG (mmol/L): 6.8 (calc)

## 2019-11-14 LAB — CBC WITH DIFFERENTIAL/PLATELET
Absolute Monocytes: 601 cells/uL (ref 200–950)
Basophils Absolute: 87 cells/uL (ref 0–200)
Basophils Relative: 0.9 %
Eosinophils Absolute: 107 cells/uL (ref 15–500)
Eosinophils Relative: 1.1 %
HCT: 38.8 % (ref 35.0–45.0)
Hemoglobin: 13.2 g/dL (ref 11.7–15.5)
Lymphs Abs: 2095 cells/uL (ref 850–3900)
MCH: 33.2 pg — ABNORMAL HIGH (ref 27.0–33.0)
MCHC: 34 g/dL (ref 32.0–36.0)
MCV: 97.5 fL (ref 80.0–100.0)
MPV: 10.5 fL (ref 7.5–12.5)
Monocytes Relative: 6.2 %
Neutro Abs: 6809 cells/uL (ref 1500–7800)
Neutrophils Relative %: 70.2 %
Platelets: 267 10*3/uL (ref 140–400)
RBC: 3.98 10*6/uL (ref 3.80–5.10)
RDW: 12.8 % (ref 11.0–15.0)
Total Lymphocyte: 21.6 %
WBC: 9.7 10*3/uL (ref 3.8–10.8)

## 2019-11-14 LAB — TSH: TSH: 0.96 mIU/L (ref 0.40–4.50)

## 2019-11-14 LAB — MICROALBUMIN, URINE: Microalb, Ur: 1.8 mg/dL

## 2019-11-23 ENCOUNTER — Other Ambulatory Visit: Payer: Self-pay

## 2019-11-23 MED ORDER — ROSUVASTATIN CALCIUM 20 MG PO TABS: 20.0000 mg | ORAL_TABLET | Freq: Every day | ORAL | 0 refills | Status: DC

## 2019-11-23 MED ORDER — POTASSIUM CHLORIDE CRYS ER 10 MEQ PO TBCR: 10.0000 meq | EXTENDED_RELEASE_TABLET | Freq: Every day | ORAL | 0 refills | Status: DC

## 2019-11-23 MED ORDER — CARVEDILOL 25 MG PO TABS: 25.0000 mg | ORAL_TABLET | Freq: Two times a day (BID) | ORAL | 3 refills | Status: DC

## 2019-11-23 MED ORDER — LEVOTHYROXINE SODIUM 112 MCG PO TABS: 112.0000 ug | ORAL_TABLET | Freq: Every day | ORAL | 5 refills | Status: DC

## 2020-03-12 ENCOUNTER — Other Ambulatory Visit: Payer: Self-pay | Admitting: Family Medicine

## 2020-04-02 ENCOUNTER — Telehealth: Payer: Self-pay | Admitting: Family Medicine

## 2020-04-02 NOTE — Progress Notes (Signed)
  Chronic Care Management   Outreach Note  04/02/2020 Name: [name] MRN: [id number] DOB: 03/11/1961  Referred by: Donita Brooks, MD Reason for referral : No chief complaint on file.   An unsuccessful telephone outreach was attempted today. The patient was referred to the pharmacist for assistance with care management and care coordination.   Follow Up Plan:   Carley Perdue [employer] Scheduler

## 2020-04-11 ENCOUNTER — Telehealth: Payer: Self-pay | Admitting: Family Medicine

## 2020-04-11 NOTE — Progress Notes (Signed)
  Chronic Care Management   Outreach Note  04/11/2020 Name: [name] MRN: [id number] DOB: Oct 07, 1960  Referred by: Donita Brooks, MD Reason for referral : No chief complaint on file.   A second unsuccessful telephone outreach was attempted today. The patient was referred to pharmacist for assistance with care management and care coordination.  Follow Up Plan:   Carley Perdue [employer] Scheduler

## 2020-05-07 ENCOUNTER — Telehealth: Payer: Self-pay | Admitting: Family Medicine

## 2020-05-07 NOTE — Progress Notes (Signed)
  Chronic Care Management   Outreach Note  05/07/2020 Name: KHADEEJA ELDEN MRN: [id number] DOB: [birth year]  Referred by: Donita Brooks, MD Reason for referral : No chief complaint on file.   Third unsuccessful telephone outreach was attempted today. The patient was referred to the pharmacist for assistance with care management and care coordination.   Follow Up Plan:   Carley Perdue [employer] Scheduler

## 2020-05-21 ENCOUNTER — Other Ambulatory Visit: Payer: Self-pay | Admitting: Family Medicine

## 2020-07-08 ENCOUNTER — Other Ambulatory Visit: Payer: Self-pay | Admitting: Family Medicine

## 2020-09-17 ENCOUNTER — Other Ambulatory Visit: Payer: Medicare HMO

## 2020-09-17 ENCOUNTER — Other Ambulatory Visit: Payer: Self-pay

## 2020-09-17 DIAGNOSIS — E7849 Other hyperlipidemia: Secondary | ICD-10-CM | POA: Diagnosis not present

## 2020-09-17 DIAGNOSIS — E118 Type 2 diabetes mellitus with unspecified complications: Secondary | ICD-10-CM

## 2020-09-17 DIAGNOSIS — E039 Hypothyroidism, unspecified: Secondary | ICD-10-CM

## 2020-09-17 DIAGNOSIS — I10 Essential (primary) hypertension: Secondary | ICD-10-CM

## 2020-09-18 LAB — LIPID PANEL
Cholesterol: 114 mg/dL (ref <200)
HDL: 35 mg/dL — ABNORMAL LOW (ref >=50)
LDL Cholesterol (Calc): 58 mg/dL (calc)
Non-HDL Cholesterol (Calc): 79 mg/dL (calc) (ref <130)
Total CHOL/HDL Ratio: 3.3 (calc) (ref <5.0)
Triglycerides: 131 mg/dL (ref <150)

## 2020-09-18 LAB — HEMOGLOBIN A1C
Hgb A1c MFr Bld: 6.1 % of total Hgb — ABNORMAL HIGH (ref <5.7)
Mean Plasma Glucose: 128 mg/dL
eAG (mmol/L): 7.1 mmol/L

## 2020-09-18 LAB — COMPLETE METABOLIC PANEL WITH GFR
AG Ratio: 1.8 (calc) (ref 1.0–2.5)
ALT: 8 U/L (ref 6–29)
AST: 10 U/L (ref 10–35)
Albumin: 4.3 g/dL (ref 3.6–5.1)
Alkaline phosphatase (APISO): 74 U/L (ref 37–153)
BUN: 14 mg/dL (ref 7–25)
CO2: 27 mmol/L (ref 20–32)
Calcium: 9.6 mg/dL (ref 8.6–10.4)
Chloride: 101 mmol/L (ref 98–110)
Creat: 0.63 mg/dL (ref 0.50–1.05)
GFR, Est African American: 114 mL/min/{1.73_m2} (ref >=60)
GFR, Est Non African American: 98 mL/min/{1.73_m2} (ref >=60)
Globulin: 2.4 g/dL (calc) (ref 1.9–3.7)
Glucose, Bld: 107 mg/dL — ABNORMAL HIGH (ref 65–99)
Potassium: 4.8 mmol/L (ref 3.5–5.3)
Sodium: 137 mmol/L (ref 135–146)
Total Bilirubin: 0.5 mg/dL (ref 0.2–1.2)
Total Protein: 6.7 g/dL (ref 6.1–8.1)

## 2020-09-18 LAB — CBC WITH DIFFERENTIAL/PLATELET
Absolute Monocytes: 567 cells/uL (ref 200–950)
Basophils Absolute: 81 cells/uL (ref 0–200)
Basophils Relative: 0.9 %
Eosinophils Absolute: 81 cells/uL (ref 15–500)
Eosinophils Relative: 0.9 %
HCT: 38.5 % (ref 35.0–45.0)
Hemoglobin: 13.2 g/dL (ref 11.7–15.5)
Lymphs Abs: 2277 cells/uL (ref 850–3900)
MCH: 34.1 pg — ABNORMAL HIGH (ref 27.0–33.0)
MCHC: 34.3 g/dL (ref 32.0–36.0)
MCV: 99.5 fL (ref 80.0–100.0)
MPV: 9.7 fL (ref 7.5–12.5)
Monocytes Relative: 6.3 %
Neutro Abs: 5994 cells/uL (ref 1500–7800)
Neutrophils Relative %: 66.6 %
Platelets: 314 10*3/uL (ref 140–400)
RBC: 3.87 10*6/uL (ref 3.80–5.10)
RDW: 14.5 % (ref 11.0–15.0)
Total Lymphocyte: 25.3 %
WBC: 9 10*3/uL (ref 3.8–10.8)

## 2020-09-18 LAB — TSH: TSH: 1.31 mIU/L (ref 0.40–4.50)

## 2020-09-26 ENCOUNTER — Other Ambulatory Visit: Payer: Self-pay

## 2020-09-26 ENCOUNTER — Ambulatory Visit (INDEPENDENT_AMBULATORY_CARE_PROVIDER_SITE_OTHER): Payer: Medicare HMO | Admitting: Family Medicine

## 2020-09-26 VITALS — BP 150/64 | HR 62 | Temp 97.8°F | Resp 18

## 2020-09-26 DIAGNOSIS — I10 Essential (primary) hypertension: Secondary | ICD-10-CM

## 2020-09-26 DIAGNOSIS — I739 Peripheral vascular disease, unspecified: Secondary | ICD-10-CM

## 2020-09-26 DIAGNOSIS — F17200 Nicotine dependence, unspecified, uncomplicated: Secondary | ICD-10-CM | POA: Diagnosis not present

## 2020-09-26 DIAGNOSIS — E118 Type 2 diabetes mellitus with unspecified complications: Secondary | ICD-10-CM

## 2020-09-26 DIAGNOSIS — Z0001 Encounter for general adult medical examination with abnormal findings: Secondary | ICD-10-CM

## 2020-09-26 DIAGNOSIS — Z23 Encounter for immunization: Secondary | ICD-10-CM

## 2020-09-26 DIAGNOSIS — Z1231 Encounter for screening mammogram for malignant neoplasm of breast: Secondary | ICD-10-CM | POA: Diagnosis not present

## 2020-09-26 DIAGNOSIS — I110 Hypertensive heart disease with heart failure: Secondary | ICD-10-CM | POA: Diagnosis not present

## 2020-09-26 DIAGNOSIS — I509 Heart failure, unspecified: Secondary | ICD-10-CM | POA: Diagnosis not present

## 2020-09-26 DIAGNOSIS — E1151 Type 2 diabetes mellitus with diabetic peripheral angiopathy without gangrene: Secondary | ICD-10-CM | POA: Diagnosis not present

## 2020-09-26 DIAGNOSIS — E039 Hypothyroidism, unspecified: Secondary | ICD-10-CM

## 2020-09-26 DIAGNOSIS — E7849 Other hyperlipidemia: Secondary | ICD-10-CM

## 2020-09-26 DIAGNOSIS — Z0000 Encounter for general adult medical examination without abnormal findings: Secondary | ICD-10-CM

## 2020-09-26 MED ORDER — POTASSIUM CHLORIDE CRYS ER 10 MEQ PO TBCR: 10.0000 meq | EXTENDED_RELEASE_TABLET | Freq: Every day | ORAL | 3 refills | Status: DC

## 2020-09-26 MED ORDER — OMEPRAZOLE 40 MG PO CPDR: 1.0000 | DELAYED_RELEASE_CAPSULE | Freq: Every day | ORAL | 3 refills | Status: DC

## 2020-09-26 MED ORDER — FUROSEMIDE 20 MG PO TABS: 20.0000 mg | ORAL_TABLET | Freq: Every day | ORAL | 3 refills | Status: DC

## 2020-09-26 MED ORDER — CARVEDILOL 25 MG PO TABS: 25.0000 mg | ORAL_TABLET | Freq: Two times a day (BID) | ORAL | 3 refills | Status: DC

## 2020-09-26 MED ORDER — AMLODIPINE BESYLATE 10 MG PO TABS: 10.0000 mg | ORAL_TABLET | Freq: Every day | ORAL | 3 refills | Status: DC

## 2020-09-26 MED ORDER — LEVOTHYROXINE SODIUM 112 MCG PO TABS: 112.0000 ug | ORAL_TABLET | Freq: Every day | ORAL | 3 refills | Status: DC

## 2020-09-26 MED ORDER — LOSARTAN POTASSIUM 100 MG PO TABS: 1.0000 | ORAL_TABLET | Freq: Every day | ORAL | 3 refills | Status: DC

## 2020-09-26 MED ORDER — METFORMIN HCL 1000 MG PO TABS: 1.0000 | ORAL_TABLET | Freq: Two times a day (BID) | ORAL | 3 refills | Status: DC

## 2020-09-26 MED ORDER — ROSUVASTATIN CALCIUM 20 MG PO TABS: 20.0000 mg | ORAL_TABLET | Freq: Every day | ORAL | 3 refills | Status: DC

## 2020-09-26 NOTE — Progress Notes (Signed)
Patient in office for immunization update. Patient due for TDAP.   [employer] report run and given to patient. Verbalized consent for immunization administration.   Tolerated administration well.

## 2020-09-26 NOTE — Progress Notes (Signed)
Subjective:    Patient ID: [name], female    DOB: 07/01/1960, 60 y.o.   MRN: [id number]  Patient is a very pleasant 60 year old Caucasian female here today for complete physical exam.  She denies any concerns.  She is due for mammogram and she will allow me to schedule her for that.  Colonoscopy is up-to-date and is due again next year.  She is due for a Pap smear.  She refuses this today and prefers to wait till another appointment.  Her last Pap smear was 3 years ago and was negative.  She is also due for lung cancer screening is a smoker.  She declines this today.  Her shot record is listed below Immunization History  Administered Date(s) Administered  . H1N1 03/29/2008  . Influenza, High Dose Seasonal PF 03/07/2018  . Influenza,inj,Quad PF,6+ Mos 02/20/2013, 05/09/2014, 03/04/2015, 03/26/2016, 03/03/2017, 03/08/2019  . Influenza-Unspecified 04/16/2010, 02/02/2011, 02/22/2012, 03/03/2017, 03/07/2018  . PFIZER(Purple Top)SARS-COV-2 Vaccination 10/11/2019, 11/02/2019, 05/19/2020  . Pneumococcal Polysaccharide-23 08/30/2007, 09/05/2017  . Td 05/03/2011  . Tdap 05/03/2011, 09/26/2020  . Zoster Recombinat (Shingrix) 02/12/2020    Past Medical History:  Diagnosis Date  . Arthritis    knees  . Blood transfusion 08/2007  . CHF (congestive heart failure) ([place])   . Claudication ([place])   . Coronary artery disease   . Diabetes mellitus   . GERD (gastroesophageal reflux disease)   . Hyperlipidemia   . Hypertension   . Hypothyroidism   . PVD (peripheral vascular disease) ([place])   . PVD (peripheral vascular disease) ([place])   . Tobacco abuse    No past surgical history on file. Current Outpatient Medications on File Prior to Visit  Medication Sig Dispense Refill  . ACCU-CHEK AVIVA PLUS test strip USE AS INSTRUCTED 300 strip 3  . Accu-Chek Softclix Lancets lancets USE AS INSTRUCTED 300 each 3  . Alcohol Swabs (B-D SINGLE USE SWABS REGULAR) PADS USE AS DIRECTED 300 each 3  .  amLODipine (NORVASC) 10 MG tablet Take 1 tablet (10 mg total) by mouth daily. 90 tablet 3  . aspirin 325 MG tablet Take 325 mg by mouth daily.    . Blood Glucose Calibration (ACCU-CHEK AVIVA) SOLN USE AS DIRECTED 1 each 3  . Blood Glucose Monitoring Suppl (ACCU-CHEK AVIVA PLUS) w/Device KIT Use as directed 1 kit 0  . carvedilol (COREG) 25 MG tablet Take 1 tablet (25 mg total) by mouth 2 (two) times daily with a meal. 180 tablet 3  . furosemide (LASIX) 20 MG tablet TAKE 1 TABLET EVERY DAY 90 tablet 3  . levothyroxine (SYNTHROID) 112 MCG tablet TAKE 1 TABLET DAILY BEFORE BREAKFAST 90 tablet 3  . losartan (COZAAR) 100 MG tablet TAKE 1 TABLET EVERY DAY 90 tablet 3  . metFORMIN (GLUCOPHAGE) 1000 MG tablet TAKE 1 TABLET TWICE DAILY WITH MEALS 180 tablet 3  . nitroGLYCERIN (NITROSTAT) 0.4 MG SL tablet Place 1 tablet (0.4 mg total) under the tongue every 5 (five) minutes as needed. 100 tablet 3  . Omega-3 Fatty Acids (FISH OIL) 1000 MG CAPS Take by mouth.    Marland Kitchen omeprazole (PRILOSEC) 40 MG capsule TAKE 1 CAPSULE EVERY DAY 90 capsule 3  . potassium chloride (KLOR-CON) 10 MEQ tablet TAKE 1 TABLET EVERY DAY 90 tablet 0  . rosuvastatin (CRESTOR) 20 MG tablet TAKE 1 TABLET EVERY DAY 90 tablet 0   No current facility-administered medications on file prior to visit.   No Known Allergies Social History   Socioeconomic History  .  Marital status: Legally Separated    Spouse name: Not on file  . Number of children: Not on file  . Years of education: Not on file  . Highest education level: Not on file  Occupational History  . Not on file  Tobacco Use  . Smoking status: Current Every Day Smoker    Packs/day: 0.50    Years: 32.00    Pack years: 16.00    Types: Cigarettes  . Smokeless tobacco: Never Used  Vaping Use  . Vaping Use: Never used  Substance and Sexual Activity  . Alcohol use: No  . Drug use: No  . Sexual activity: Yes  Other Topics Concern  . Not on file  Social History Narrative  .  Not on file   Social Determinants of Health   Financial Resource Strain: Not on file  Food Insecurity: Not on file  Transportation Needs: Not on file  Physical Activity: Not on file  Stress: Not on file  Social Connections: Not on file  Intimate Partner Violence: Not on file     Review of Systems  All other systems reviewed and are negative.      Objective:   Physical Exam Vitals reviewed.  Constitutional:      General: She is not in acute distress.    Appearance: She is well-developed. She is not diaphoretic.  HENT:     Mouth/Throat:     Pharynx: No oropharyngeal exudate.  Eyes:     General: No scleral icterus.    Conjunctiva/sclera: Conjunctivae normal.  Neck:     Thyroid: No thyromegaly.     Vascular: No JVD.  Cardiovascular:     Rate and Rhythm: Normal rate and regular rhythm.     Heart sounds: Murmur heard.  No friction rub. No gallop.   Pulmonary:     Effort: Pulmonary effort is normal. No respiratory distress.     Breath sounds: No wheezing or rales.  Abdominal:     General: Bowel sounds are normal. There is no distension.     Palpations: Abdomen is soft.     Tenderness: There is no abdominal tenderness. There is no guarding or rebound.  Musculoskeletal:     Cervical back: Neck supple.  Lymphadenopathy:     Cervical: No cervical adenopathy.       Lab on 09/17/2020  Component Date Value Ref Range Status  . WBC 09/17/2020 9.0  3.8 - 10.8 Thousand/uL Final  . RBC 09/17/2020 3.87  3.80 - 5.10 Million/uL Final  . Hemoglobin 09/17/2020 13.2  11.7 - 15.5 g/dL Final  . HCT 09/17/2020 38.5  35.0 - 45.0 % Final  . MCV 09/17/2020 99.5  80.0 - 100.0 fL Final  . MCH 09/17/2020 34.1* 27.0 - 33.0 pg Final  . MCHC 09/17/2020 34.3  32.0 - 36.0 g/dL Final  . RDW 09/17/2020 14.5  11.0 - 15.0 % Final  . Platelets 09/17/2020 314  140 - 400 Thousand/uL Final  . MPV 09/17/2020 9.7  7.5 - 12.5 fL Final  . Neutro Abs 09/17/2020 5,994  1,500 - 7,800 cells/uL Final  .  Lymphs Abs 09/17/2020 2,277  850 - 3,900 cells/uL Final  . Absolute Monocytes 09/17/2020 567  200 - 950 cells/uL Final  . Eosinophils Absolute 09/17/2020 81  15 - 500 cells/uL Final  . Basophils Absolute 09/17/2020 81  0 - 200 cells/uL Final  . Neutrophils Relative % 09/17/2020 66.6  % Final  . Total Lymphocyte 09/17/2020 25.3  % Final  . Monocytes Relative  09/17/2020 6.3  % Final  . Eosinophils Relative 09/17/2020 0.9  % Final  . Basophils Relative 09/17/2020 0.9  % Final  . Glucose, Bld 09/17/2020 107* 65 - 99 mg/dL Final   Comment: .            Fasting reference interval . For someone without known diabetes, a glucose value between 100 and 125 mg/dL is consistent with prediabetes and should be confirmed with a follow-up test. .   . BUN 09/17/2020 14  7 - 25 mg/dL Final  . Creat 09/17/2020 0.63  0.50 - 1.05 mg/dL Final   Comment: For patients >25 years of age, the reference limit for Creatinine is approximately 13% higher for people identified as African-American. .   . GFR, Est Non African American 09/17/2020 98  > OR = 60 mL/min/1.30m Final  . GFR, Est African American 09/17/2020 114  > OR = 60 mL/min/1.792mFinal  . BUN/Creatinine Ratio 0478/29/5621OT APPLICABLE  6 - 22 (calc) Final  . Sodium 09/17/2020 137  135 - 146 mmol/L Final  . Potassium 09/17/2020 4.8  3.5 - 5.3 mmol/L Final  . Chloride 09/17/2020 101  98 - 110 mmol/L Final  . CO2 09/17/2020 27  20 - 32 mmol/L Final  . Calcium 09/17/2020 9.6  8.6 - 10.4 mg/dL Final  . Total Protein 09/17/2020 6.7  6.1 - 8.1 g/dL Final  . Albumin 09/17/2020 4.3  3.6 - 5.1 g/dL Final  . Globulin 09/17/2020 2.4  1.9 - 3.7 g/dL (calc) Final  . AG Ratio 09/17/2020 1.8  1.0 - 2.5 (calc) Final  . Total Bilirubin 09/17/2020 0.5  0.2 - 1.2 mg/dL Final  . Alkaline phosphatase (APISO) 09/17/2020 74  37 - 153 U/L Final  . AST 09/17/2020 10  10 - 35 U/L Final  . ALT 09/17/2020 8  6 - 29 U/L Final  . Hgb A1c MFr Bld 09/17/2020 6.1* <5.7 % of  total Hgb Final   Comment: For someone without known diabetes, a hemoglobin  A1c value between 5.7% and 6.4% is consistent with prediabetes and should be confirmed with a  follow-up test. . For someone with known diabetes, a value <7% indicates that their diabetes is well controlled. A1c targets should be individualized based on duration of diabetes, age, comorbid conditions, and other considerations. . This assay result is consistent with an increased risk of diabetes. . Currently, no consensus exists regarding use of hemoglobin A1c for diagnosis of diabetes for children. .   . Mean Plasma Glucose 09/17/2020 128  mg/dL Final  . eAG (mmol/L) 09/17/2020 7.1  mmol/L Final  . Cholesterol 09/17/2020 114  <200 mg/dL Final  . HDL 09/17/2020 35* > OR = 50 mg/dL Final  . Triglycerides 09/17/2020 131  <150 mg/dL Final  . LDL Cholesterol (Calc) 09/17/2020 58  mg/dL (calc) Final   Comment: Reference range: <100 . Desirable range <100 mg/dL for primary prevention;   <70 mg/dL for patients with CHD or diabetic patients  with > or = 2 CHD risk factors. . Marland KitchenDL-C is now calculated using the Martin-Hopkins  calculation, which is a validated novel method providing  better accuracy than the Friedewald equation in the  estimation of LDL-C.  MaCresenciano Genret al. JAAnnamaria [id number];578(46 [id number]  (http://education.[employer].com/faq/FAQ164)   . Total CHOL/HDL Ratio 09/17/2020 3.3  <5.0 (calc) Final  . Non-HDL Cholesterol (Calc) 09/17/2020 79  <130 mg/dL (calc) Final   Comment: For patients with diabetes plus 1 major ASCVD risk  factor, treating to  a non-HDL-C goal of <100 mg/dL  (LDL-C of <70 mg/dL) is considered a therapeutic  option.   . TSH 09/17/2020 1.31  0.40 - 4.50 mIU/L Final       Assessment & Plan:  General medical exam  Essential hypertension  Hypothyroidism, unspecified type - Plan: TSH  TOBACCO ABUSE  Controlled type 2 diabetes mellitus with complication, without  long-term current use of insulin ([place]) - Plan: CBC with Differential/Platelet, COMPLETE METABOLIC PANEL WITH GFR, Lipid panel, Microalbumin, urine, Hemoglobin A1c  PVD (peripheral vascular disease) ([place])  Other hyperlipidemia  Blood pressure today is elevated however her home blood pressures which she has been recording are 847-841 systolic.  These are very well controlled.  Her diabetes is well controlled with an A1c of 6.2.  Her LDL cholesterol is well controlled.  The remainder of her lab work including her levothyroxine is well within normal limits aside from her low HDL cholesterol.  I will schedule the patient for a mammogram.  I recommended a Pap smear but she defers this at the present time.  Diabetic foot exam was performed today and is normal aside from diminished pedal pulses.  Spent the majority of her visit encouraging the patient to quit smoking.  I recommended that she get a lung CT to screen for lung cancer but she declines this at the present time

## 2020-09-27 LAB — MICROALBUMIN, URINE: Microalb, Ur: 0.2 mg/dL

## 2020-10-16 ENCOUNTER — Telehealth: Payer: Self-pay | Admitting: Family Medicine

## 2020-10-16 NOTE — Telephone Encounter (Signed)
Left message for patient to call back and schedule Medicare Annual Wellness Visit (AWV) in office.   If not able to come in office, please offer to do virtually or by telephone.   Last AWV: 01/04/2018   Please schedule at anytime with [facility]-Nurse Health Advisor.  If any questions, please contact me at [phone]

## 2020-11-26 ENCOUNTER — Ambulatory Visit (HOSPITAL_COMMUNITY)
Admission: RE | Admit: 2020-11-26 | Discharge: 2020-11-26 | Disposition: A | Discharge status: Home / Self Care | Payer: Medicare HMO | Source: Ambulatory Visit | Attending: Family Medicine | Admitting: Family Medicine

## 2020-11-26 ENCOUNTER — Other Ambulatory Visit: Payer: Self-pay

## 2020-11-26 DIAGNOSIS — Z1231 Encounter for screening mammogram for malignant neoplasm of breast: Secondary | ICD-10-CM | POA: Diagnosis not present

## 2020-12-04 ENCOUNTER — Telehealth: Payer: Self-pay | Admitting: *Deleted

## 2020-12-04 NOTE — Chronic Care Management (AMB) (Signed)
  Chronic Care Management   Outreach Note  12/04/2020 Name: [name] MRN: [id number] DOB: 08-18-1960  [name] is a 60 y.o. year old female who is a primary care patient of Tanya Nones, Priscille Heidelberg, MD. I reached out to Pricilla Riffle by phone today in response to a referral sent by Ms. Fritzi Mandes Thall's PCP, Dr. Tanya Nones.      An unsuccessful telephone outreach was attempted today. The patient was referred to the case management team for assistance with care management and care coordination.   Follow Up Plan: A HIPAA compliant phone message was left for the patient providing contact information and requesting a return call. The care management team will reach out to the patient again over the next 7 days.  If patient returns call to provider office, please advise to call Embedded Care Management Care Guide Eugenie Birks at [phone].  Gwenevere Ghazi  Care Guide, Embedded Care Coordination [facility] Management

## 2020-12-11 NOTE — Chronic Care Management (AMB) (Signed)
  Chronic Care Management   Outreach Note  12/11/2020 Name: [name] MRN: [id number] DOB: 05-30-1961  [name] is a 60 y.o. year old female who is a primary care patient of Tanya Nones, Priscille Heidelberg, MD. I reached out to [name] by phone today in response to a referral sent by [name]'s PCP, Dr. Tanya Nones.      A second unsuccessful telephone outreach was attempted today. The patient was referred to the case management team for assistance with care management and care coordination.   Follow Up Plan: A HIPAA compliant phone message was left for the patient providing contact information and requesting a return call. The care management team will reach out to the patient again over the next 7 days.  If patient returns call to provider office, please advise to call Embedded Care Management Care Guide Gwenevere Ghazi at [phone].  Gwenevere Ghazi  Care Guide, Embedded Care Coordination [facility] Management

## 2020-12-12 NOTE — Chronic Care Management (AMB) (Signed)
  Chronic Care Management   Outreach Note  12/12/2020 Name: SHERANDA SEABROOKS MRN: [id number] DOB: 20-Jun-1960  KRISTELLE CAVALLARO is a 60 y.o. year old female who is a primary care patient of Tanya Nones, Priscille Heidelberg, MD. I reached out to Pricilla Riffle by phone today in response to a referral sent by Ms. Fritzi Mandes Higginson's PCP, Dr. Tanya Nones.      Third unsuccessful telephone outreach was attempted today. The patient was referred to the case management team for assistance with care management and care coordination. The patient's primary care provider has been notified of our unsuccessful attempts to make or maintain contact with the patient. The care management team is pleased to engage with this patient at any time in the future should he/she be interested in assistance from the care management team.   Follow Up Plan: A HIPAA compliant phone message was left for the patient providing contact information and requesting a return call. The care management team will reach out to the patient again over the next 7 days.  If patient returns call to provider office, please advise to call Embedded Care Management Care Guide Gwenevere Ghazi at [phone].  Gwenevere Ghazi  Care Guide, Embedded Care Coordination [facility] Management

## 2020-12-18 NOTE — Chronic Care Management (AMB) (Signed)
  Chronic Care Management   Outreach Note  12/18/2020 Name: [name] MRN: [id number] DOB: 02/25/1961  [name] is a 60 y.o. year old female who is a primary care patient of Tanya Nones, Priscille Heidelberg, MD. I reached out to Pricilla Riffle by phone today in response to a referral sent by [name]'s PCP, Dr. Tanya Nones.      Forth unsuccessful telephone outreach was attempted today. The patient was referred to the case management team for assistance with care management and care coordination. The patient's primary care provider has been notified of our unsuccessful attempts to make or maintain contact with the patient. The care management team is pleased to engage with this patient at any time in the future should he/she be interested in assistance from the care management team.   Follow Up Plan: We have been unable to make contact with the patient.The care management team is available to follow up with the patient after provider conversation with the patient regarding recommendation for care management engagement and subsequent re-referral to the care management team. A HIPAA compliant phone message was left for the patient providing contact information and requesting a return call. If patient returns call to provider office, please advise to call Embedded Care Management Care Guide Gwenevere Ghazi at [phone].  Gwenevere Ghazi  Care Guide, Embedded Care Coordination [facility] Management  Direct Dial: [phone]

## 2021-01-10 ENCOUNTER — Other Ambulatory Visit: Payer: Self-pay | Admitting: Family Medicine

## 2021-04-17 ENCOUNTER — Telehealth: Payer: Self-pay | Admitting: Family Medicine

## 2021-04-17 NOTE — Telephone Encounter (Signed)
Left message for patient to call back and schedule Medicare Annual Wellness Visit (AWV) in office.   If not able to come in office, please offer to do virtually or by telephone.  Left office number and my jabber [phone].  Last AWV:01/04/2018  Please schedule at anytime with Nurse Health Advisor.

## 2021-04-30 ENCOUNTER — Other Ambulatory Visit: Payer: Self-pay

## 2021-04-30 ENCOUNTER — Ambulatory Visit (INDEPENDENT_AMBULATORY_CARE_PROVIDER_SITE_OTHER): Payer: Medicare HMO

## 2021-04-30 VITALS — Ht 66.0 in | Wt 205.0 lb

## 2021-04-30 DIAGNOSIS — Z0000 Encounter for general adult medical examination without abnormal findings: Secondary | ICD-10-CM

## 2021-04-30 NOTE — Progress Notes (Signed)
Subjective:   [name] is a 60 y.o. female who presents for Medicare Annual (Subsequent) preventive examination. Virtual Visit via Telephone Note  I connected with  [name] on 04/30/21 at 12:00 PM EST by telephone and verified that I am speaking with the correct person using two identifiers.  Location: Patient: Home Provider: [facility] Persons participating in the virtual visit: patient/Nurse Health Advisor   I discussed the limitations, risks, security and privacy concerns of performing an evaluation and management service by telephone and the availability of in person appointments. The patient expressed understanding and agreed to proceed.  Interactive audio and video telecommunications were attempted between this nurse and patient, however failed, due to patient having technical difficulties OR patient did not have access to video capability.  We continued and completed visit with audio only.  Some vital signs may be absent or patient reported.   Chriss Driver, LPN  Review of Systems     Cardiac Risk Factors include: advanced age (>90mn, >>78women);diabetes mellitus;hypertension;dyslipidemia;obesity (BMI >30kg/m2);sedentary lifestyle     Objective:    Today's Vitals   04/30/21 1151  Weight: 205 lb (93 kg)  Height: '5\' 6"'  (1.676 m)   Body mass index is 33.09 kg/m.  Advanced Directives 04/30/2021 09/26/2020 01/04/2018  Does Patient Have a Medical Advance Directive? No No No  Would patient like information on creating a medical advance directive? No - Patient declined No - Patient declined Yes (ED - Information included in AVS)    Current Medications (verified) Outpatient Encounter Medications as of 04/30/2021  Medication Sig   ACCU-CHEK AVIVA PLUS test strip USE AS INSTRUCTED   Accu-Chek Softclix Lancets lancets USE AS INSTRUCTED   Alcohol Swabs (B-D SINGLE USE SWABS REGULAR) PADS USE AS DIRECTED   amLODipine (NORVASC) 10 MG tablet Take 1 tablet (10 mg  total) by mouth daily.   aspirin 325 MG tablet Take 325 mg by mouth daily.   Blood Glucose Calibration (ACCU-CHEK AVIVA) SOLN USE AS DIRECTED   Blood Glucose Monitoring Suppl (ACCU-CHEK AVIVA PLUS) w/Device KIT Use as directed   carvedilol (COREG) 25 MG tablet TAKE 1 TABLET TWICE DAILY WITH A MEAL   FLUZONE QUADRIVALENT 0.5 ML injection    furosemide (LASIX) 20 MG tablet Take 1 tablet (20 mg total) by mouth daily.   levothyroxine (SYNTHROID) 112 MCG tablet Take 1 tablet (112 mcg total) by mouth daily before breakfast.   losartan (COZAAR) 100 MG tablet Take 1 tablet (100 mg total) by mouth daily.   metFORMIN (GLUCOPHAGE) 1000 MG tablet Take 1 tablet (1,000 mg total) by mouth 2 (two) times daily with a meal.   nitroGLYCERIN (NITROSTAT) 0.4 MG SL tablet Place 1 tablet (0.4 mg total) under the tongue every 5 (five) minutes as needed.   Omega-3 Fatty Acids (FISH OIL) 1000 MG CAPS Take by mouth.   omeprazole (PRILOSEC) 40 MG capsule Take 1 capsule (40 mg total) by mouth daily.   potassium chloride (KLOR-CON) 10 MEQ tablet    rosuvastatin (CRESTOR) 20 MG tablet Take 1 tablet (20 mg total) by mouth daily.   [DISCONTINUED] potassium chloride (KLOR-CON) 10 MEQ tablet TAKE 1 TABLET EVERY DAY   No facility-administered encounter medications on file as of 04/30/2021.    Allergies (verified) Patient has no known allergies.   History: Past Medical History:  Diagnosis Date   Arthritis    knees   Blood transfusion 08/2007   CHF (congestive heart failure) (HCC)    Claudication ([place]  Coronary artery disease    Diabetes mellitus    GERD (gastroesophageal reflux disease)    Hyperlipidemia    Hypertension    Hypothyroidism    PVD (peripheral vascular disease) (HCC)    PVD (peripheral vascular disease) ([place])    Tobacco abuse    History reviewed. No pertinent surgical history. Family History  Problem Relation Age of Onset   Heart disease Mother    Stroke Mother    Heart disease Father     Heart disease Maternal Grandmother    Stroke Maternal Grandmother    Heart disease Maternal Grandfather    Kidney disease Paternal Grandmother    Heart disease Paternal Grandfather    Social History   Socioeconomic History   Marital status: Legally Separated    Spouse name: Not on file   Number of children: 0   Years of education: Not on file   Highest education level: Not on file  Occupational History   Not on file  Tobacco Use   Smoking status: Every Day    Packs/day: 0.50    Years: 32.00    Pack years: 16.00    Types: Cigarettes   Smokeless tobacco: Never  Vaping Use   Vaping Use: Never used  Substance and Sexual Activity   Alcohol use: No   Drug use: No   Sexual activity: Yes  Other Topics Concern   Not on file  Social History Narrative   Not on file   Social Determinants of Health   Financial Resource Strain: Low Risk    Difficulty of Paying Living Expenses: Not hard at all  Food Insecurity: No Food Insecurity   Worried About Charity fundraiser in the Last Year: Never true   Ran Out of Food in the Last Year: Never true  Transportation Needs: No Transportation Needs   Lack of Transportation (Medical): No   Lack of Transportation (Non-Medical): No  Physical Activity: Insufficiently Active   Days of Exercise per Week: 4 days   Minutes of Exercise per Session: 30 min  Stress: No Stress Concern Present   Feeling of Stress : Not at all  Social Connections: Socially Isolated   Frequency of Communication with Friends and Family: More than three times a week   Frequency of Social Gatherings with Friends and Family: Twice a week   Attends Religious Services: Never   Marine scientist or Organizations: No   Attends Music therapist: Never   Marital Status: Separated    Tobacco Counseling Ready to quit: Not Answered Counseling given: Not Answered   Clinical Intake:  Pre-visit preparation completed: Yes  Pain : No/denies pain     BMI -  recorded: 33.09 Nutritional Status: BMI > 30  Obese Nutritional Risks: None Diabetes: Yes CBG done?: No Did pt. bring in CBG monitor from home?: No  How often do you need to have someone help you when you read instructions, pamphlets, or other written materials from your doctor or pharmacy?: 1 - Never  Diabetic?Nutrition Risk Assessment:  Has the patient had any N/V/D within the last 2 months?  No  Does the patient have any non-healing wounds?  No  Has the patient had any unintentional weight loss or weight gain?  No   Diabetes:  Is the patient diabetic?  Yes  If diabetic, was a CBG obtained today?  No  Did the patient bring in their glucometer from home?  No  How often do you monitor your CBG's? Daily, sometimes  BID.   Financial Strains and Diabetes Management:  Are you having any financial strains with the device, your supplies or your medication? Yes .  Does the patient want to be seen by Chronic Care Management for management of their diabetes?  No  Would the patient like to be referred to a Nutritionist or for Diabetic Management?  No   Diabetic Exams:  Diabetic Eye Exam: Completed 03/24/2018. Overdue for diabetic eye exam. Pt has been advised about the importance in completing this exam. Pt states she will call to schedule exam after first of year.  Diabetic Foot Exam: Completed 03/08/2019. Pt has been advised about the importance in completing this exam.   Interpreter Needed?: No  Information entered by :: MJ Daneil Beem, LPN   Activities of Daily Living In your present state of health, do you have any difficulty performing the following activities: 04/30/2021 09/26/2020  Hearing? N N  Vision? N N  Difficulty concentrating or making decisions? N N  Walking or climbing stairs? N N  Dressing or bathing? N N  Doing errands, shopping? N N  Preparing Food and eating ? N N  Using the Toilet? N N  In the past six months, have you accidently leaked urine? N N  Do you have  problems with loss of bowel control? N N  Managing your Medications? N N  Managing your Finances? N N  Housekeeping or managing your Housekeeping? N N  Some recent data might be hidden    Patient Care Team: Susy Frizzle, MD as PCP - General (Family Medicine) Verl Blalock, Marijo Conception, MD (Inactive) as Attending Physician (Cardiology)  Indicate any recent Medical Services you may have received from other than [facility] providers in the past year (date may be approximate).     Assessment:   This is a routine wellness examination for [facility].  Hearing/Vision screen Hearing Screening - Comments:: No hearing issues.  Vision Screening - Comments:: Glasses. [place].   Dietary issues and exercise activities discussed: Current Exercise Habits: Home exercise routine, Type of exercise: walking;stretching, Time (Minutes): 30, Frequency (Times/Week): 4, Weekly Exercise (Minutes/Week): 120, Intensity: Mild, Exercise limited by: orthopedic condition(s);cardiac condition(s)   Goals Addressed             This Visit's Progress    DIET - REDUCE CALORIE INTAKE   Not on track      Depression Screen PHQ 2/9 Scores 04/30/2021 09/26/2020 01/04/2018 01/04/2018 03/03/2017 10/23/2015 12/14/2013  PHQ - 2 Score 0 0 0 0 0 0 0  PHQ- 9 Score - - - - 0 - -    Fall Risk Fall Risk  04/30/2021 09/26/2020 01/04/2018 01/04/2018 12/14/2013  Falls in the past year? 0 0 No No No  Number falls in past yr: 0 - - - -  Injury with Fall? 0 - - - -  Risk for fall due to : Impaired balance/gait;Orthopedic patient No Fall Risks - - -  Follow up Falls prevention discussed Falls evaluation completed - - -    FALL RISK PREVENTION PERTAINING TO THE HOME:  Any stairs in or around the home? Yes  If so, are there any without handrails? No  Home free of loose throw rugs in walkways, pet beds, electrical cords, etc? Yes  Adequate lighting in your home to reduce risk of falls? Yes   ASSISTIVE DEVICES UTILIZED TO PREVENT  FALLS:  Life alert? No  Use of a cane, walker or w/c? Yes  Grab bars in the bathroom? No  Shower chair or bench in shower? No  Elevated toilet seat or a handicapped toilet? No   TIMED UP AND GO:  Was the test performed? No .  Phone visit.  Cognitive Function:     6CIT Screen 04/30/2021  What Year? 0 points  What month? 0 points  What time? 0 points  Count back from 20 0 points  Months in reverse 0 points  Repeat phrase 2 points  Total Score 2    Immunizations Immunization History  Administered Date(s) Administered   H1N1 03/29/2008   Influenza, High Dose Seasonal PF 03/07/2018   Influenza,inj,Quad PF,6+ Mos 02/20/2013, 05/09/2014, 03/04/2015, 03/26/2016, 03/03/2017, 03/08/2019   Influenza-Unspecified 04/16/2010, 02/02/2011, 02/22/2012, 03/03/2017, 03/07/2018, 03/11/2021   PFIZER(Purple Top)SARS-COV-2 Vaccination 10/11/2019, 11/02/2019, 05/19/2020   Pneumococcal Polysaccharide-23 08/30/2007, 09/05/2017   Td 05/03/2011   Tdap 05/03/2011, 09/26/2020   Zoster Recombinat (Shingrix) 02/12/2020    TDAP status: Up to date  Flu Vaccine status: Up to date  Pneumococcal vaccine status: Up to date  Covid-19 vaccine status: Completed vaccines  Qualifies for Shingles Vaccine? Yes   Zostavax completed No   Shingrix Completed?: Yes  Screening Tests Health Maintenance  Topic Date Due   Pneumococcal Vaccine 9-36 Years old (2 - PCV) 09/06/2018   OPHTHALMOLOGY EXAM  03/25/2019   FOOT EXAM  03/07/2020   Zoster Vaccines- Shingrix (2 of 2) 04/08/2020   COVID-19 Vaccine (4 - Booster for Pfizer series) 07/14/2020   PAP SMEAR-Modifier  01/11/2021   HEMOGLOBIN A1C  03/19/2021   COLONOSCOPY (Pts 45-69yr Insurance coverage will need to be confirmed)  09/22/2021   MAMMOGRAM  11/27/2022   TETANUS/TDAP  09/27/2030   INFLUENZA VACCINE  Completed   Hepatitis C Screening  Completed   HIV Screening  Completed   HPV VACCINES  Aged Out    Health Maintenance  Health Maintenance Due   Topic Date Due   Pneumococcal Vaccine 137630Years old (2 - PCV) 09/06/2018   OPHTHALMOLOGY EXAM  03/25/2019   FOOT EXAM  03/07/2020   Zoster Vaccines- Shingrix (2 of 2) 04/08/2020   COVID-19 Vaccine (4 - Booster for Pfizer series) 07/14/2020   PAP SMEAR-Modifier  01/11/2021   HEMOGLOBIN A1C  03/19/2021    Colorectal cancer screening: Type of screening: Colonoscopy. Completed 09/23/2011. Repeat every 10 years  Mammogram status: Completed 11/26/2020. Repeat every year  Bone Density status: Completed 03/10/2018. Results reflect: Bone density results: OSTEOPENIA. Repeat every 2 years.  Lung Cancer Screening: (Low Dose CT Chest recommended if Age 60-80years, 30 pack-year currently smoking OR have quit w/in 15years.) does not qualify.  Smoking 16 pack year.  Additional Screening:  Hepatitis C Screening: does qualify; Completed 10/23/2015  Vision Screening: Recommended annual ophthalmology exams for early detection of glaucoma and other disorders of the eye. Is the patient up to date with their annual eye exam?  No  Who is the provider or what is the name of the office in which the patient attends annual eye exams? N/A If pt is not established with a provider, would they like to be referred to a provider to establish care? No .   Dental Screening: Recommended annual dental exams for proper oral hygiene  Community Resource Referral / Chronic Care Management: CRR required this visit?  No   CCM required this visit?  No      Plan:     I have personally reviewed and noted the following in the patient's chart:   Medical and social history Use of alcohol, tobacco or  illicit drugs  Current medications and supplements including opioid prescriptions.  Functional ability and status Nutritional status Physical activity Advanced directives List of other physicians Hospitalizations, surgeries, and ER visits in previous 12 months Vitals Screenings to include cognitive, depression, and  falls Referrals and appointments  In addition, I have reviewed and discussed with patient certain preventive protocols, quality metrics, and best practice recommendations. A written personalized care plan for preventive services as well as general preventive health recommendations were provided to patient.     Chriss Driver, LPN   [id number]   Nurse Notes: Phone visit. 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 Pt is up to date on all age appropriate vaccines. Pt is overdue for eye exam, diabetic foot exam and bone density. Pt states she will call and schedule appointments after first of year.

## 2021-04-30 NOTE — Patient Instructions (Signed)
[name] , Thank you for taking time to come for your Medicare Wellness Visit. I appreciate your ongoing commitment to your health goals. Please review the following plan we discussed and let me know if I can assist you in the future.   Screening recommendations/referrals: Colonoscopy: Done 09/23/2011 Repeat in 10 years  Mammogram: Done 11/26/2020 Repeat annually  Bone Density: Done 03/10/2018 Repeat every 2 years  Recommended yearly ophthalmology/optometry visit for glaucoma screening and checkup Recommended yearly dental visit for hygiene and checkup  Vaccinations: Influenza vaccine: Done 03/11/2021 Repeat annually  Pneumococcal vaccine: Done 08/30/2007 and 09/05/2017 Tdap vaccine: Done 09/26/2020 Repeat in 10 years  Shingles vaccine: Shingrix discussed. Please contact your pharmacy for coverage information.    Covid-19: Done 5/123/2021, 11/12/2019 and 05/19/2020  Advanced directives: Advance directive discussed with you today. Even though you declined this today, please call our office should you change your mind, and we can give you the proper paperwork for you to fill out.   Conditions/risks identified: Aim for 30 minutes of exercise or brisk walking each day, drink 6-8 glasses of water and eat lots of fruits and vegetables.   Next appointment: Follow up in one year for your annual wellness visit. 2023.  Preventive Care 40-64 Years, Female Preventive care refers to lifestyle choices and visits with your health care provider that can promote health and wellness. What does preventive care include? A yearly physical exam. This is also called an annual well check. Dental exams once or twice a year. Routine eye exams. Ask your health care provider how often you should have your eyes checked. Personal lifestyle choices, including: Daily care of your teeth and gums. Regular physical activity. Eating a healthy diet. Avoiding tobacco and drug use. Limiting alcohol use. Practicing  safe sex. Taking low-dose aspirin daily starting at age 19. Taking vitamin and mineral supplements as recommended by your health care provider. What happens during an annual well check? The services and screenings done by your health care provider during your annual well check will depend on your age, overall health, lifestyle risk factors, and family history of disease. Counseling  Your health care provider may ask you questions about your: Alcohol use. Tobacco use. Drug use. Emotional well-being. Home and relationship well-being. Sexual activity. Eating habits. Work and work Statistician. Method of birth control. Menstrual cycle. Pregnancy history. Screening  You may have the following tests or measurements: Height, weight, and BMI. Blood pressure. Lipid and cholesterol levels. These may be checked every 5 years, or more frequently if you are over 57 years old. Skin check. Lung cancer screening. You may have this screening every year starting at age 28 if you have a 30-pack-year history of smoking and currently smoke or have quit within the past 15 years. Fecal occult blood test (FOBT) of the stool. You may have this test every year starting at age 59. Flexible sigmoidoscopy or colonoscopy. You may have a sigmoidoscopy every 5 years or a colonoscopy every 10 years starting at age 3. Hepatitis C blood test. Hepatitis B blood test. Sexually transmitted disease (STD) testing. Diabetes screening. This is done by checking your blood sugar (glucose) after you have not eaten for a while (fasting). You may have this done every 1-3 years. Mammogram. This may be done every 1-2 years. Talk to your health care provider about when you should start having regular mammograms. This may depend on whether you have a family history of breast cancer. BRCA-related cancer screening. This may be done if you  have a family history of breast, ovarian, tubal, or peritoneal cancers. Pelvic exam and Pap test.  This may be done every 3 years starting at age 44. Starting at age 63, this may be done every 5 years if you have a Pap test in combination with an HPV test. Bone density scan. This is done to screen for osteoporosis. You may have this scan if you are at high risk for osteoporosis. Discuss your test results, treatment options, and if necessary, the need for more tests with your health care provider. Vaccines  Your health care provider may recommend certain vaccines, such as: Influenza vaccine. This is recommended every year. Tetanus, diphtheria, and acellular pertussis (Tdap, Td) vaccine. You may need a Td booster every 10 years. Zoster vaccine. You may need this after age 51. Pneumococcal 13-valent conjugate (PCV13) vaccine. You may need this if you have certain conditions and were not previously vaccinated. Pneumococcal polysaccharide (PPSV23) vaccine. You may need one or two doses if you smoke cigarettes or if you have certain conditions. Talk to your health care provider about which screenings and vaccines you need and how often you need them. This information is not intended to replace advice given to you by your health care provider. Make sure you discuss any questions you have with your health care provider. Document Released: 06/13/2015 Document Revised: 02/04/2016 Document Reviewed: 03/18/2015 Elsevier Interactive Patient Education  2017 [place] Prevention in the Home Falls can cause injuries. They can happen to people of all ages. There are many things you can do to make your home safe and to help prevent falls. What can I do on the outside of my home? Regularly fix the edges of walkways and driveways and fix any cracks. Remove anything that might make you trip as you walk through a door, such as a raised step or threshold. Trim any bushes or trees on the path to your home. Use bright outdoor lighting. Clear any walking paths of anything that might make someone trip,  such as rocks or tools. Regularly check to see if handrails are loose or broken. Make sure that both sides of any steps have handrails. Any raised decks and porches should have guardrails on the edges. Have any leaves, snow, or ice cleared regularly. Use sand or salt on walking paths during winter. Clean up any spills in your garage right away. This includes oil or grease spills. What can I do in the bathroom? Use night lights. Install grab bars by the toilet and in the tub and shower. Do not use towel bars as grab bars. Use non-skid mats or decals in the tub or shower. If you need to sit down in the shower, use a plastic, non-slip stool. Keep the floor dry. Clean up any water that spills on the floor as soon as it happens. Remove soap buildup in the tub or shower regularly. Attach bath mats securely with double-sided non-slip rug tape. Do not have throw rugs and other things on the floor that can make you trip. What can I do in the bedroom? Use night lights. Make sure that you have a light by your bed that is easy to reach. Do not use any sheets or blankets that are too big for your bed. They should not hang down onto the floor. Have a firm chair that has side arms. You can use this for support while you get dressed. Do not have throw rugs and other things on the floor that  can make you trip. What can I do in the kitchen? Clean up any spills right away. Avoid walking on wet floors. Keep items that you use a lot in easy-to-reach places. If you need to reach something above you, use a strong step stool that has a grab bar. Keep electrical cords out of the way. Do not use floor polish or wax that makes floors slippery. If you must use wax, use non-skid floor wax. Do not have throw rugs and other things on the floor that can make you trip. What can I do with my stairs? Do not leave any items on the stairs. Make sure that there are handrails on both sides of the stairs and use them. Fix  handrails that are broken or loose. Make sure that handrails are as long as the stairways. Check any carpeting to make sure that it is firmly attached to the stairs. Fix any carpet that is loose or worn. Avoid having throw rugs at the top or bottom of the stairs. If you do have throw rugs, attach them to the floor with carpet tape. Make sure that you have a light switch at the top of the stairs and the bottom of the stairs. If you do not have them, ask someone to add them for you. What else can I do to help prevent falls? Wear shoes that: Do not have high heels. Have rubber bottoms. Are comfortable and fit you well. Are closed at the toe. Do not wear sandals. If you use a stepladder: Make sure that it is fully opened. Do not climb a closed stepladder. Make sure that both sides of the stepladder are locked into place. Ask someone to hold it for you, if possible. Clearly mark and make sure that you can see: Any grab bars or handrails. First and last steps. Where the edge of each step is. Use tools that help you move around (mobility aids) if they are needed. These include: Canes. Walkers. Scooters. Crutches. Turn on the lights when you go into a dark area. Replace any light bulbs as soon as they burn out. Set up your furniture so you have a clear path. Avoid moving your furniture around. If any of your floors are uneven, fix them. If there are any pets around you, be aware of where they are. Review your medicines with your doctor. Some medicines can make you feel dizzy. This can increase your chance of falling. Ask your doctor what other things that you can do to help prevent falls. This information is not intended to replace advice given to you by your health care provider. Make sure you discuss any questions you have with your health care provider. Document Released: 03/13/2009 Document Revised: 10/23/2015 Document Reviewed: 06/21/2014 Elsevier Interactive Patient Education  2017  [employer].

## 2021-05-03 ENCOUNTER — Other Ambulatory Visit: Payer: Self-pay | Admitting: Family Medicine

## 2021-05-19 ENCOUNTER — Other Ambulatory Visit (INDEPENDENT_AMBULATORY_CARE_PROVIDER_SITE_OTHER): Payer: Medicare HMO

## 2021-05-19 ENCOUNTER — Other Ambulatory Visit: Payer: Self-pay

## 2021-05-19 DIAGNOSIS — E039 Hypothyroidism, unspecified: Secondary | ICD-10-CM | POA: Diagnosis not present

## 2021-05-19 DIAGNOSIS — E118 Type 2 diabetes mellitus with unspecified complications: Secondary | ICD-10-CM | POA: Diagnosis not present

## 2021-05-20 LAB — COMPLETE METABOLIC PANEL WITH GFR
AG Ratio: 1.6 (calc) (ref 1.0–2.5)
ALT: 11 U/L (ref 6–29)
AST: 13 U/L (ref 10–35)
Albumin: 4.4 g/dL (ref 3.6–5.1)
Alkaline phosphatase (APISO): 108 U/L (ref 37–153)
BUN: 14 mg/dL (ref 7–25)
CO2: 27 mmol/L (ref 20–32)
Calcium: 10.1 mg/dL (ref 8.6–10.4)
Chloride: 103 mmol/L (ref 98–110)
Creat: 0.65 mg/dL (ref 0.50–1.05)
Globulin: 2.7 g/dL (calc) (ref 1.9–3.7)
Glucose, Bld: 111 mg/dL — ABNORMAL HIGH (ref 65–99)
Potassium: 6 mmol/L — ABNORMAL HIGH (ref 3.5–5.3)
Sodium: 140 mmol/L (ref 135–146)
Total Bilirubin: 0.4 mg/dL (ref 0.2–1.2)
Total Protein: 7.1 g/dL (ref 6.1–8.1)
eGFR: 101 mL/min/{1.73_m2} (ref >=60)

## 2021-05-20 LAB — CBC WITH DIFFERENTIAL/PLATELET
Absolute Monocytes: 445 cells/uL (ref 200–950)
Basophils Absolute: 67 cells/uL (ref 0–200)
Basophils Relative: 0.8 %
Eosinophils Absolute: 118 cells/uL (ref 15–500)
Eosinophils Relative: 1.4 %
HCT: 38.1 % (ref 35.0–45.0)
Hemoglobin: 12.6 g/dL (ref 11.7–15.5)
Lymphs Abs: 1915 cells/uL (ref 850–3900)
MCH: 31.9 pg (ref 27.0–33.0)
MCHC: 33.1 g/dL (ref 32.0–36.0)
MCV: 96.5 fL (ref 80.0–100.0)
MPV: 10 fL (ref 7.5–12.5)
Monocytes Relative: 5.3 %
Neutro Abs: 5855 cells/uL (ref 1500–7800)
Neutrophils Relative %: 69.7 %
Platelets: 341 10*3/uL (ref 140–400)
RBC: 3.95 10*6/uL (ref 3.80–5.10)
RDW: 12.6 % (ref 11.0–15.0)
Total Lymphocyte: 22.8 %
WBC: 8.4 10*3/uL (ref 3.8–10.8)

## 2021-05-20 LAB — TSH: TSH: 1.09 mIU/L (ref 0.40–4.50)

## 2021-05-20 LAB — LIPID PANEL
Cholesterol: 131 mg/dL (ref <200)
HDL: 48 mg/dL — ABNORMAL LOW (ref >=50)
LDL Cholesterol (Calc): 66 mg/dL (calc)
Non-HDL Cholesterol (Calc): 83 mg/dL (calc) (ref <130)
Total CHOL/HDL Ratio: 2.7 (calc) (ref <5.0)
Triglycerides: 91 mg/dL (ref <150)

## 2021-05-20 LAB — HEMOGLOBIN A1C
Hgb A1c MFr Bld: 5.7 % of total Hgb — ABNORMAL HIGH (ref <5.7)
Mean Plasma Glucose: 117 mg/dL
eAG (mmol/L): 6.5 mmol/L

## 2021-05-21 ENCOUNTER — Other Ambulatory Visit: Payer: Self-pay

## 2021-05-21 DIAGNOSIS — E875 Hyperkalemia: Secondary | ICD-10-CM

## 2021-05-26 ENCOUNTER — Other Ambulatory Visit: Payer: Medicare HMO

## 2021-05-26 ENCOUNTER — Other Ambulatory Visit: Payer: Self-pay

## 2021-05-26 DIAGNOSIS — E875 Hyperkalemia: Secondary | ICD-10-CM

## 2021-05-27 LAB — COMPREHENSIVE METABOLIC PANEL
AG Ratio: 1.6 (calc) (ref 1.0–2.5)
ALT: 9 U/L (ref 6–29)
AST: 12 U/L (ref 10–35)
Albumin: 4 g/dL (ref 3.6–5.1)
Alkaline phosphatase (APISO): 101 U/L (ref 37–153)
BUN: 20 mg/dL (ref 7–25)
CO2: 27 mmol/L (ref 20–32)
Calcium: 9.6 mg/dL (ref 8.6–10.4)
Chloride: 105 mmol/L (ref 98–110)
Creat: 0.67 mg/dL (ref 0.50–1.05)
Globulin: 2.5 g/dL (calc) (ref 1.9–3.7)
Glucose, Bld: 92 mg/dL (ref 65–99)
Potassium: 4.4 mmol/L (ref 3.5–5.3)
Sodium: 140 mmol/L (ref 135–146)
Total Bilirubin: 0.3 mg/dL (ref 0.2–1.2)
Total Protein: 6.5 g/dL (ref 6.1–8.1)

## 2021-05-28 ENCOUNTER — Telehealth: Payer: Self-pay

## 2021-05-28 ENCOUNTER — Encounter: Payer: Self-pay | Admitting: Family Medicine

## 2021-05-28 ENCOUNTER — Ambulatory Visit (INDEPENDENT_AMBULATORY_CARE_PROVIDER_SITE_OTHER): Payer: Medicare HMO | Admitting: Family Medicine

## 2021-05-28 ENCOUNTER — Other Ambulatory Visit: Payer: Self-pay

## 2021-05-28 VITALS — BP 128/78 | HR 58 | Temp 98.1°F | Resp 18 | Ht 66.0 in | Wt 205.0 lb

## 2021-05-28 DIAGNOSIS — I509 Heart failure, unspecified: Secondary | ICD-10-CM | POA: Diagnosis not present

## 2021-05-28 DIAGNOSIS — Z124 Encounter for screening for malignant neoplasm of cervix: Secondary | ICD-10-CM | POA: Diagnosis not present

## 2021-05-28 DIAGNOSIS — E119 Type 2 diabetes mellitus without complications: Secondary | ICD-10-CM | POA: Diagnosis not present

## 2021-05-28 DIAGNOSIS — E875 Hyperkalemia: Secondary | ICD-10-CM

## 2021-05-28 NOTE — Progress Notes (Signed)
Subjective:    Patient ID: [name], female    DOB: May 26, 1961, 60 y.o.   MRN: [id number]  HPI  Patient is a 60 year old Caucasian female who is here today for a Pap smear.  I saw her for her complete physical exam earlier in the year.  I recommended a Pap smear at that time however the patient wanted to defer it at that time to a later date.  She scheduled this appointment to perform Pap smear.  Otherwise she is doing well.  I am concerned that her oxygen levels are slightly low.  She is audibly wheezing today on exam I believe this is evidence of COPD.  Continue to encourage her to quit smoking however she is in the precontemplative phase.  She states that she feels fine.  She denies any shortness of breath or chest pain. Past Medical History:  Diagnosis Date   Arthritis    knees   Blood transfusion 08/2007   CHF (congestive heart failure) (HCC)    Claudication (HCC)    Coronary artery disease    Diabetes mellitus    GERD (gastroesophageal reflux disease)    Hyperlipidemia    Hypertension    Hypothyroidism    PVD (peripheral vascular disease) ([place])    PVD (peripheral vascular disease) ([place])    Tobacco abuse    History reviewed. No pertinent surgical history. Current Outpatient Medications on File Prior to Visit  Medication Sig Dispense Refill   ACCU-CHEK AVIVA PLUS test strip USE AS INSTRUCTED 300 strip 3   Accu-Chek Softclix Lancets lancets USE AS INSTRUCTED 300 each 3   Alcohol Swabs (DROPSAFE ALCOHOL PREP) 70 % PADS USE AS DIRECTED 300 each 3   amLODipine (NORVASC) 10 MG tablet Take 1 tablet (10 mg total) by mouth daily. 90 tablet 3   aspirin 325 MG tablet Take 325 mg by mouth daily.     Blood Glucose Calibration (ACCU-CHEK AVIVA) SOLN USE AS DIRECTED 1 each 3   Blood Glucose Monitoring Suppl (ACCU-CHEK AVIVA PLUS) w/Device KIT Use as directed 1 kit 0   carvedilol (COREG) 25 MG tablet TAKE 1 TABLET TWICE DAILY WITH A MEAL 180 tablet 3   furosemide (LASIX) 20 MG tablet  Take 1 tablet (20 mg total) by mouth daily. 90 tablet 3   levothyroxine (SYNTHROID) 112 MCG tablet Take 1 tablet (112 mcg total) by mouth daily before breakfast. 90 tablet 3   losartan (COZAAR) 100 MG tablet Take 1 tablet (100 mg total) by mouth daily. 90 tablet 3   metFORMIN (GLUCOPHAGE) 1000 MG tablet Take 1 tablet (1,000 mg total) by mouth 2 (two) times daily with a meal. 180 tablet 3   nitroGLYCERIN (NITROSTAT) 0.4 MG SL tablet Place 1 tablet (0.4 mg total) under the tongue every 5 (five) minutes as needed. 100 tablet 3   Omega-3 Fatty Acids (FISH OIL) 1000 MG CAPS Take by mouth.     omeprazole (PRILOSEC) 40 MG capsule Take 1 capsule (40 mg total) by mouth daily. 90 capsule 3   rosuvastatin (CRESTOR) 20 MG tablet Take 1 tablet (20 mg total) by mouth daily. 90 tablet 3   potassium chloride (KLOR-CON) 10 MEQ tablet  (Patient not taking: Reported on 05/28/2021)     No current facility-administered medications on file prior to visit.   Marland Kitchenall Social History   Socioeconomic History   Marital status: Legally Separated    Spouse name: Not on file   Number of children: 0   Years of education:  Not on file   Highest education level: Not on file  Occupational History   Not on file  Tobacco Use   Smoking status: Every Day    Packs/day: 0.50    Years: 32.00    Pack years: 16.00    Types: Cigarettes   Smokeless tobacco: Never  Vaping Use   Vaping Use: Never used  Substance and Sexual Activity   Alcohol use: No   Drug use: No   Sexual activity: Yes  Other Topics Concern   Not on file  Social History Narrative   Not on file   Social Determinants of Health   Financial Resource Strain: Low Risk    Difficulty of Paying Living Expenses: Not hard at all  Food Insecurity: No Food Insecurity   Worried About Charity fundraiser in the Last Year: Never true   Ran Out of Food in the Last Year: Never true  Transportation Needs: No Transportation Needs   Lack of Transportation (Medical): No    Lack of Transportation (Non-Medical): No  Physical Activity: Insufficiently Active   Days of Exercise per Week: 4 days   Minutes of Exercise per Session: 30 min  Stress: No Stress Concern Present   Feeling of Stress : Not at all  Social Connections: Socially Isolated   Frequency of Communication with Friends and Family: More than three times a week   Frequency of Social Gatherings with Friends and Family: Twice a week   Attends Religious Services: Never   Marine scientist or Organizations: No   Attends Music therapist: Never   Marital Status: Separated  Intimate Partner Violence: Not At Risk   Fear of Current or Ex-Partner: No   Emotionally Abused: No   Physically Abused: No   Sexually Abused: No      Review of Systems  All other systems reviewed and are negative.     Objective:   Physical Exam Exam conducted with a chaperone present.  Constitutional:      General: She is not in acute distress.    Appearance: Normal appearance. She is not ill-appearing or toxic-appearing.  Cardiovascular:     Rate and Rhythm: Normal rate and regular rhythm.     Pulses: Normal pulses.     Heart sounds: Normal heart sounds. No murmur heard.   No gallop.  Pulmonary:     Effort: Pulmonary effort is normal.     Breath sounds: Wheezing present.  Abdominal:     Hernia: There is no hernia in the left inguinal area or right inguinal area.  Genitourinary:    General: Normal vulva.     Labia:        Right: No rash.        Left: No rash.      Vagina: Normal.     Cervix: No cervical motion tenderness, friability, lesion, erythema or cervical bleeding.  Lymphadenopathy:     Lower Body: No right inguinal adenopathy. No left inguinal adenopathy.  Neurological:     Mental Status: She is alert.          Assessment & Plan:   Cervical cancer screening - Plan: PAP, Thin Prep w/HPV rflx HPV Type 16/18 Pap smear was sent to pathology in a labeled container.  Patient  tolerated procedure well without complication.  I continue to encourage her to quit smoking but she is in the precontemplative phase.

## 2021-05-28 NOTE — Telephone Encounter (Signed)
Pt called in just wanting to know when pcp would like for her to start back taking her potassium. Pt also asked when does she need to come back into the office for lab work to recheck her potassium.  Please advise.  Cb#: [phone]

## 2021-05-29 LAB — PAP, TP IMAGING W/ HPV RNA, RFLX HPV TYPE 16,18/45: HPV DNA High Risk: NOT DETECTED

## 2021-05-29 NOTE — Telephone Encounter (Signed)
Informed patient Potassium is normal at 4.4.  Not low.  I'd stay off potassium for now and recheck it in 3 months with her fasting labs. Appointments scheduled.  Orders placed.

## 2021-07-15 ENCOUNTER — Other Ambulatory Visit: Payer: Self-pay | Admitting: Family Medicine

## 2021-08-13 LAB — HM DIABETES EYE EXAM

## 2021-08-20 ENCOUNTER — Other Ambulatory Visit: Payer: Self-pay | Admitting: Family Medicine

## 2021-08-25 ENCOUNTER — Other Ambulatory Visit: Payer: Self-pay

## 2021-08-25 ENCOUNTER — Other Ambulatory Visit: Payer: Medicare HMO

## 2021-08-25 DIAGNOSIS — I252 Old myocardial infarction: Secondary | ICD-10-CM

## 2021-08-25 LAB — POTASSIUM: Potassium: 4.6 mmol/L (ref 3.5–5.3)

## 2021-08-25 NOTE — Progress Notes (Signed)
po

## 2021-08-27 ENCOUNTER — Ambulatory Visit: Payer: Medicare HMO | Admitting: Family Medicine

## 2021-09-03 ENCOUNTER — Ambulatory Visit: Payer: Medicare HMO | Admitting: Family Medicine

## 2021-09-09 ENCOUNTER — Other Ambulatory Visit: Payer: Self-pay | Admitting: Family Medicine

## 2021-09-30 ENCOUNTER — Other Ambulatory Visit: Payer: Self-pay | Admitting: Family Medicine

## 2021-09-30 NOTE — Telephone Encounter (Signed)
Patient has scheduled appointment-11/17/21. RF per protocol ?Requested Prescriptions  ?Pending Prescriptions Disp Refills  ?? metFORMIN (GLUCOPHAGE) 1000 MG tablet [Pharmacy Med Name: METFORMIN HYDROCHLORIDE 1000 MG Tablet] 60 tablet 1  ?  Sig: TAKE 1 TABLET TWICE DAILY WITH MEALS (NEED MD APPOINTMENT)  ?  ? Endocrinology:  Diabetes - Biguanides Failed - 09/30/2021  2:47 AM  ?  ?  Failed - B12 Level in normal range and within 720 days  ?  No results found for: VITAMINB12   ?  ?  Failed - Valid encounter within last 6 months  ?  Recent Outpatient Visits   ?      ? 1 year ago General medical exam  ? [facility] Pickard, Cammie Mcgee, MD  ? 1 year ago Essential hypertension  ? [facility] Pickard, Cammie Mcgee, MD  ? 2 years ago Essential hypertension  ? [facility] Pickard, Cammie Mcgee, MD  ? 3 years ago Essential hypertension  ? [facility] Pickard, Cammie Mcgee, MD  ? 3 years ago Hypothyroidism, unspecified type  ? [facility] Pickard, Cammie Mcgee, MD  ?  ?  ?Future Appointments   ?        ? In 1 month Pickard, Cammie Mcgee, MD [place]  ?  ? ?  ?  ?  Passed - Cr in normal range and within 360 days  ?  Creat  ?Date Value Ref Range Status  ?05/26/2021 0.67 0.50 - 1.05 mg/dL Final  ? ?Creatinine,U  ?Date Value Ref Range Status  ?09/12/2007   Final  ? 28.8 ?(NOTE)  Cutoff Values for Urine Drug Screen:        Drug Class           Cutoff (ng/mL)        Amphetamines            1000        Barbiturates             200        Cocaine Metabolites      300        Benzodiazepines          200        Methadone       ?          300        Opiates                 2000        Phencyclidine             25        Propoxyphene             300        Marijuana Metabolites     50  For medical purposes only.  ?   ?  ?  Passed - HBA1C is between 0 and 7.9 and within 180 days  ?  Hgb A1c MFr Bld  ?Date Value Ref Range Status  ?05/19/2021 5.7 (H) <5.7 % of  total Hgb Final  ?  Comment:  ?  For someone without known diabetes, a hemoglobin  ?A1c value between 5.7% and 6.4% is consistent with ?prediabetes and should be confirmed with a  ?follow-up test. ?. ?For someone with known diabetes, a value <7% ?indicates that their diabetes is well controlled. A1c ?targets should be individualized based on duration  of ?diabetes, age, comorbid conditions, and other ?considerations. ?. ?This assay result is consistent with an increased risk ?of diabetes. ?. ?Currently, no consensus exists regarding use of ?hemoglobin A1c for diagnosis of diabetes for children. ?. ?  ?   ?  ?  Passed - eGFR in normal range and within 360 days  ?  GFR, Est African American  ?Date Value Ref Range Status  ?09/17/2020 114 > OR = 60 mL/min/1.75m Final  ? ?GFR, Est Non African American  ?Date Value Ref Range Status  ?09/17/2020 98 > OR = 60 mL/min/1.754mFinal  ? ?eGFR  ?Date Value Ref Range Status  ?05/19/2021 101 > OR = 60 mL/min/1.7351minal  ?  Comment:  ?  The eGFR is based on the CKD-EPI 2021 equation. To calculate  ?the new eGFR from a previous Creatinine or Cystatin C ?result, go to https://www.kidney.org/professionals/ ?kdoqi/gfr%5Fcalculator ?  ?   ?  ?  Passed - CBC within normal limits and completed in the last 12 months  ?  WBC  ?Date Value Ref Range Status  ?05/19/2021 8.4 3.8 - 10.8 Thousand/uL Final  ? ?RBC  ?Date Value Ref Range Status  ?05/19/2021 3.95 3.80 - 5.10 Million/uL Final  ? ?Hemoglobin  ?Date Value Ref Range Status  ?05/19/2021 12.6 11.7 - 15.5 g/dL Final  ? ?HCT  ?Date Value Ref Range Status  ?05/19/2021 38.1 35.0 - 45.0 % Final  ? ?MCHC  ?Date Value Ref Range Status  ?05/19/2021 33.1 32.0 - 36.0 g/dL Final  ? ?MCH  ?Date Value Ref Range Status  ?05/19/2021 31.9 27.0 - 33.0 pg Final  ? ?MCV  ?Date Value Ref Range Status  ?05/19/2021 96.5 80.0 - 100.0 fL Final  ? ?No results found for: PLTCOUNTKUC, LABPLAT, POCLochbuieDW  ?Date Value Ref Range Status  ?05/19/2021 12.6 11.0 - 15.0  % Final  ? ?  ?  ?  ? ?

## 2021-10-10 ENCOUNTER — Encounter: Payer: Self-pay | Admitting: Internal Medicine

## 2021-10-31 ENCOUNTER — Other Ambulatory Visit: Payer: Self-pay | Admitting: Family Medicine

## 2021-11-02 NOTE — Telephone Encounter (Signed)
Requested medications are due for refill today.  yes  Requested medications are on the active medications list.  yes  Last refill. 09/09/2021 #30 0 refills  Future visit scheduled.   yes  Notes to clinic.  Courtesy refill already given.    Requested Prescriptions  Pending Prescriptions Disp Refills   levothyroxine (SYNTHROID) 112 MCG tablet [Pharmacy Med Name: LEVOTHYROXINE SODIUM 112 MCG Tablet] 60 tablet     Sig: TAKE 1 TABLET EVERY DAY BEFORE BREAKFAST (NEED MD APPOINTMENT)     Endocrinology:  Hypothyroid Agents Failed - 10/31/2021  3:22 AM      Failed - Valid encounter within last 12 months    Recent Outpatient Visits           1 year ago General medical exam   [facility] Donita Brooks, MD   1 year ago Essential hypertension   [facility] Tanya Nones, Priscille Heidelberg, MD   2 years ago Essential hypertension   [facility] Tanya Nones, Priscille Heidelberg, MD   3 years ago Essential hypertension   [facility] Tanya Nones, Priscille Heidelberg, MD   3 years ago Hypothyroidism, unspecified type   [facility] Pickard, Priscille Heidelberg, MD       Future Appointments             In 2 weeks Pickard, Priscille Heidelberg, MD [facility], [facility]             Passed - TSH in normal range and within 360 days    TSH  Date Value Ref Range Status  05/19/2021 1.09 0.40 - 4.50 mIU/L Final

## 2021-11-17 ENCOUNTER — Ambulatory Visit: Payer: Self-pay | Admitting: Family Medicine

## 2021-11-21 ENCOUNTER — Other Ambulatory Visit: Payer: Self-pay | Admitting: Family Medicine

## 2021-11-23 NOTE — Telephone Encounter (Signed)
Requested medications are due for refill today.  unsure  Requested medications are on the active medications list.  yes  Last refill. 02/18/2021 - unknown quantity  Future visit scheduled.   yes  Notes to clinic.  Medication is listed as historical.    Requested Prescriptions  Pending Prescriptions Disp Refills   potassium chloride (KLOR-CON) 10 MEQ tablet [Pharmacy Med Name: POTASSIUM CHLORIDE ER 10 MEQ Tablet Extended Release] 90 tablet     Sig: TAKE 1 TABLET EVERY DAY     Endocrinology:  Minerals - Potassium Supplementation Failed - 11/21/2021  4:05 AM      Failed - Valid encounter within last 12 months    Recent Outpatient Visits           1 year ago General medical exam   [facility] Donita Brooks, MD   2 years ago Essential hypertension   [facility] Pickard, Priscille Heidelberg, MD   2 years ago Essential hypertension   [facility] Donita Brooks, MD   3 years ago Essential hypertension   [facility] Donita Brooks, MD   3 years ago Hypothyroidism, unspecified type   [facility] Donita Brooks, MD       Future Appointments             In 3 weeks Pickard, Priscille Heidelberg, MD [facility], [facility]            Passed - K in normal range and within 360 days    Potassium  Date Value Ref Range Status  08/25/2021 4.6 3.5 - 5.3 mmol/L Final         Passed - Cr in normal range and within 360 days    Creat  Date Value Ref Range Status  05/26/2021 0.67 0.50 - 1.05 mg/dL Final   Creatinine,U  Date Value Ref Range Status  09/12/2007   Final   28.8 (NOTE)  Cutoff Values for Urine Drug Screen:        Drug Class           Cutoff (ng/mL)        Amphetamines            1000        Barbiturates             200        Cocaine Metabolites      300        Benzodiazepines          200        Methadone                 300        Opiates                 2000        Phencyclidine              25        Propoxyphene             300        Marijuana Metabolites     50  For medical purposes only.         Signed Prescriptions Disp Refills   metFORMIN (GLUCOPHAGE) 1000 MG tablet 180 tablet 0    Sig: TAKE 1 TABLET TWICE DAILY WITH MEALS (NEED MD APPOINTMENT)     Endocrinology:  Diabetes - Biguanides Failed -  11/21/2021  4:05 AM      Failed - HBA1C is between 0 and 7.9 and within 180 days    Hgb A1c MFr Bld  Date Value Ref Range Status  05/19/2021 5.7 (H) <5.7 % of total Hgb Final    Comment:    For someone without known diabetes, a hemoglobin  A1c value between 5.7% and 6.4% is consistent with prediabetes and should be confirmed with a  follow-up test. . For someone with known diabetes, a value <7% indicates that their diabetes is well controlled. A1c targets should be individualized based on duration of diabetes, age, comorbid conditions, and other considerations. . This assay result is consistent with an increased risk of diabetes. . Currently, no consensus exists regarding use of hemoglobin A1c for diagnosis of diabetes for children. .          Failed - B12 Level in normal range and within 720 days    No results found for: "VITAMINB12"       Failed - Valid encounter within last 6 months    Recent Outpatient Visits           1 year ago General medical exam   [facility] Donita Brooks, MD   2 years ago Essential hypertension   [facility] Tanya Nones, Priscille Heidelberg, MD   2 years ago Essential hypertension   [facility] Tanya Nones, Priscille Heidelberg, MD   3 years ago Essential hypertension   [facility] Tanya Nones, Priscille Heidelberg, MD   3 years ago Hypothyroidism, unspecified type   [facility] Donita Brooks, MD       Future Appointments             In 3 weeks Pickard, Priscille Heidelberg, MD [facility], [facility]            Passed - Cr in normal range and within 360 days    Creat   Date Value Ref Range Status  05/26/2021 0.67 0.50 - 1.05 mg/dL Final   Creatinine,U  Date Value Ref Range Status  09/12/2007   Final   28.8 (NOTE)  Cutoff Values for Urine Drug Screen:        Drug Class           Cutoff (ng/mL)        Amphetamines            1000        Barbiturates             200        Cocaine Metabolites      300        Benzodiazepines          200        Methadone                 300        Opiates                 2000        Phencyclidine             25        Propoxyphene             300        Marijuana Metabolites     50  For medical purposes only.         Passed - eGFR in normal range and  within 360 days    GFR, Est African American  Date Value Ref Range Status  09/17/2020 114 > OR = 60 mL/min/1.26m2 Final   GFR, Est Non African American  Date Value Ref Range Status  09/17/2020 98 > OR = 60 mL/min/1.63m2 Final   eGFR  Date Value Ref Range Status  05/19/2021 101 > OR = 60 mL/min/1.97m2 Final    Comment:    The eGFR is based on the CKD-EPI 2021 equation. To calculate  the new eGFR from a previous Creatinine or Cystatin C result, go to https://www.kidney.org/professionals/ kdoqi/gfr%5Fcalculator          Passed - CBC within normal limits and completed in the last 12 months    WBC  Date Value Ref Range Status  05/19/2021 8.4 3.8 - 10.8 Thousand/uL Final   RBC  Date Value Ref Range Status  05/19/2021 3.95 3.80 - 5.10 Million/uL Final   Hemoglobin  Date Value Ref Range Status  05/19/2021 12.6 11.7 - 15.5 g/dL Final   HCT  Date Value Ref Range Status  05/19/2021 38.1 35.0 - 45.0 % Final   MCHC  Date Value Ref Range Status  05/19/2021 33.1 32.0 - 36.0 g/dL Final   [facility]  Date Value Ref Range Status  05/19/2021 31.9 27.0 - 33.0 pg Final   MCV  Date Value Ref Range Status  05/19/2021 96.5 80.0 - 100.0 fL Final   No results found for: "PLTCOUNTKUC", "LABPLAT", "POCPLA" RDW  Date Value Ref Range Status  05/19/2021 12.6 11.0 - 15.0 % Final           s

## 2021-12-14 ENCOUNTER — Ambulatory Visit (INDEPENDENT_AMBULATORY_CARE_PROVIDER_SITE_OTHER): Payer: Medicare HMO | Admitting: Family Medicine

## 2021-12-14 VITALS — BP 120/70 | HR 65 | Temp 97.8°F | Ht 66.0 in | Wt 180.0 lb

## 2021-12-14 DIAGNOSIS — Z122 Encounter for screening for malignant neoplasm of respiratory organs: Secondary | ICD-10-CM

## 2021-12-14 DIAGNOSIS — I252 Old myocardial infarction: Secondary | ICD-10-CM

## 2021-12-14 DIAGNOSIS — I509 Heart failure, unspecified: Secondary | ICD-10-CM | POA: Diagnosis not present

## 2021-12-14 DIAGNOSIS — E1151 Type 2 diabetes mellitus with diabetic peripheral angiopathy without gangrene: Secondary | ICD-10-CM | POA: Diagnosis not present

## 2021-12-14 DIAGNOSIS — I110 Hypertensive heart disease with heart failure: Secondary | ICD-10-CM | POA: Diagnosis not present

## 2021-12-14 DIAGNOSIS — I10 Essential (primary) hypertension: Secondary | ICD-10-CM | POA: Diagnosis not present

## 2021-12-14 DIAGNOSIS — E118 Type 2 diabetes mellitus with unspecified complications: Secondary | ICD-10-CM | POA: Diagnosis not present

## 2021-12-14 DIAGNOSIS — E039 Hypothyroidism, unspecified: Secondary | ICD-10-CM

## 2021-12-14 DIAGNOSIS — I739 Peripheral vascular disease, unspecified: Secondary | ICD-10-CM

## 2021-12-14 DIAGNOSIS — F17200 Nicotine dependence, unspecified, uncomplicated: Secondary | ICD-10-CM

## 2021-12-14 MED ORDER — NITROGLYCERIN 0.4 MG SL SUBL: 0.4000 mg | SUBLINGUAL_TABLET | SUBLINGUAL | 3 refills | Status: DC | PRN

## 2021-12-14 NOTE — Progress Notes (Signed)
Subjective:    Patient ID: [name], female    DOB: Mar 20, 1961, 61 y.o.   MRN: [id number]  Patient is a very pleasant 61 year old Caucasian female who presents today for follow-up.  She has a history of congestive heart failure as well as peripheral vascular disease with laboratory ABI in the left leg in 2020.  She denies claudication.  She denies chest pain.  She denies shortness of breath or dyspnea on exertion.  Unfortunately she continues to smoke.  We again discussed lung cancer screening and today she consents to get a CT scan of her lungs.  She has no desire to quit smoking at the present time.  Her blood pressure is well controlled at 120/70.  She reports fasting blood sugars between 101 120.  She denies any polyuria, polydipsia, or blurry vision.  She denies any hypoglycemic episodes.  She denies any neuropathy in her feet.  Diabetic foot exam was performed today and does show weak pulses bilaterally but there are no sores forming or ulcers. Past Medical History:  Diagnosis Date   Arthritis    knees   Blood transfusion 08/2007   CHF (congestive heart failure) (HCC)    Claudication (HCC)    Coronary artery disease    Diabetes mellitus    GERD (gastroesophageal reflux disease)    Hyperlipidemia    Hypertension    Hypothyroidism    PVD (peripheral vascular disease) ([place])    PVD (peripheral vascular disease) ([place])    Tobacco abuse    No past surgical history on file. Current Outpatient Medications on File Prior to Visit  Medication Sig Dispense Refill   ACCU-CHEK AVIVA PLUS test strip USE AS INSTRUCTED 300 strip 3   Accu-Chek Softclix Lancets lancets USE AS INSTRUCTED 300 each 3   Alcohol Swabs (DROPSAFE ALCOHOL PREP) 70 % PADS USE AS DIRECTED 300 each 3   amLODipine (NORVASC) 10 MG tablet TAKE 1 TABLET EVERY DAY 90 tablet 3   aspirin 325 MG tablet Take 325 mg by mouth daily.     Blood Glucose Calibration (ACCU-CHEK AVIVA) SOLN USE AS DIRECTED 1 each 3   Blood Glucose  Monitoring Suppl (ACCU-CHEK AVIVA PLUS) w/Device KIT Use as directed 1 kit 0   carvedilol (COREG) 25 MG tablet TAKE 1 TABLET TWICE DAILY WITH MEALS 180 tablet 3   furosemide (LASIX) 20 MG tablet TAKE 1 TABLET EVERY DAY 90 tablet 3   levothyroxine (SYNTHROID) 112 MCG tablet TAKE 1 TABLET EVERY DAY BEFORE BREAKFAST (NEED MD APPOINTMENT) 30 tablet 0   losartan (COZAAR) 100 MG tablet TAKE 1 TABLET EVERY DAY 90 tablet 3   metFORMIN (GLUCOPHAGE) 1000 MG tablet TAKE 1 TABLET TWICE DAILY WITH MEALS (NEED MD APPOINTMENT) 180 tablet 0   nitroGLYCERIN (NITROSTAT) 0.4 MG SL tablet Place 1 tablet (0.4 mg total) under the tongue every 5 (five) minutes as needed. 100 tablet 3   Omega-3 Fatty Acids (FISH OIL) 1000 MG CAPS Take by mouth.     omeprazole (PRILOSEC) 40 MG capsule TAKE 1 CAPSULE EVERY DAY 90 capsule 3   potassium chloride (KLOR-CON) 10 MEQ tablet      rosuvastatin (CRESTOR) 20 MG tablet TAKE 1 TABLET EVERY DAY 90 tablet 3   No current facility-administered medications on file prior to visit.   No Known Allergies Social History   Socioeconomic History   Marital status: Legally Separated    Spouse name: Not on file   Number of children: 0   Years of education: Not  on file   Highest education level: Not on file  Occupational History   Not on file  Tobacco Use   Smoking status: Every Day    Packs/day: 0.50    Years: 32.00    Total pack years: 16.00    Types: Cigarettes   Smokeless tobacco: Never  Vaping Use   Vaping Use: Never used  Substance and Sexual Activity   Alcohol use: No   Drug use: No   Sexual activity: Yes  Other Topics Concern   Not on file  Social History Narrative   Not on file   Social Determinants of Health   Financial Resource Strain: Low Risk  (04/30/2021)   Overall Financial Resource Strain (CARDIA)    Difficulty of Paying Living Expenses: Not hard at all  Food Insecurity: No Food Insecurity (04/30/2021)   Hunger Vital Sign    Worried About Running Out of  Food in the Last Year: Never true    Ran Out of Food in the Last Year: Never true  Transportation Needs: No Transportation Needs (04/30/2021)   PRAPARE - Hydrologist (Medical): No    Lack of Transportation (Non-Medical): No  Physical Activity: Insufficiently Active (04/30/2021)   Exercise Vital Sign    Days of Exercise per Week: 4 days    Minutes of Exercise per Session: 30 min  Stress: No Stress Concern Present (04/30/2021)   [place]    Feeling of Stress : Not at all  Social Connections: Socially Isolated (04/30/2021)   Social Connection and Isolation Panel [NHANES]    Frequency of Communication with Friends and Family: More than three times a week    Frequency of Social Gatherings with Friends and Family: Twice a week    Attends Religious Services: Never    Marine scientist or Organizations: No    Attends Archivist Meetings: Never    Marital Status: Separated  Intimate Partner Violence: Not At Risk (04/30/2021)   Humiliation, Afraid, Rape, and Kick questionnaire    Fear of Current or Ex-Partner: No    Emotionally Abused: No    Physically Abused: No    Sexually Abused: No     Review of Systems  All other systems reviewed and are negative.      Objective:   Physical Exam Vitals reviewed.  Constitutional:      General: She is not in acute distress.    Appearance: She is well-developed. She is not diaphoretic.  HENT:     Mouth/Throat:     Pharynx: No oropharyngeal exudate.  Eyes:     General: No scleral icterus.    Conjunctiva/sclera: Conjunctivae normal.  Neck:     Thyroid: No thyromegaly.     Vascular: No JVD.  Cardiovascular:     Rate and Rhythm: Normal rate and regular rhythm.     Heart sounds: Murmur heard.     No friction rub. No gallop.  Pulmonary:     Effort: Pulmonary effort is normal. No respiratory distress.     Breath sounds: No wheezing or  rales.  Abdominal:     General: Bowel sounds are normal. There is no distension.     Palpations: Abdomen is soft.     Tenderness: There is no abdominal tenderness. There is no guarding or rebound.  Musculoskeletal:     Cervical back: Neck supple.  Lymphadenopathy:     Cervical: No cervical adenopathy.  Assessment & Plan:  Essential hypertension - Plan: CBC with Differential/Platelet, Lipid panel, Microalbumin, urine, COMPLETE METABOLIC PANEL WITH GFR, Hemoglobin A1c, TSH  Controlled type 2 diabetes mellitus with complication, without long-term current use of insulin (HCC) - Plan: CBC with Differential/Platelet, Lipid panel, Microalbumin, urine, COMPLETE METABOLIC PANEL WITH GFR, Hemoglobin A1c, TSH  Hypothyroidism, unspecified type - Plan: CBC with Differential/Platelet, Lipid panel, Microalbumin, urine, COMPLETE METABOLIC PANEL WITH GFR, Hemoglobin A1c, TSH  TOBACCO ABUSE - Plan: CT CHEST LUNG CA SCREEN LOW DOSE W/O CM  Old myocardial infarction  PVD (peripheral vascular disease) (HCC)  Screening for lung cancer - Plan: CT CHEST LUNG CA SCREEN LOW DOSE W/O CM Very happy with her blood pressure.  Her blood sugars sound well controlled.  We discussed switching metformin to Jardiance for cardiovascular risk reduction.  Patient elects not to switch to Jardiance.  She prefers to leave her medications where they are at the present time.  Strongly encouraged smoking cessation.  Also recommended lung cancer screening the patient consents to this today.  Check CBC CMP lipid panel.  Goal LDL cholesterol is less than 100.  Ideally I like to see the LDL cholesterol below 70 given her history of coronary artery disease with a total occlusion of the right coronary artery on catheterization in the past.  Also check TSH to monitor management of hypothyroidism.  Goal hemoglobin A1c is less than 6.5.  We will schedule a CT scan of the chest to screen for lung cancer.  Patient denies any symptoms of  claudication so I will not repeat arterial Dopplers at the present time.

## 2021-12-15 LAB — LIPID PANEL
Cholesterol: 114 mg/dL
HDL: 39 mg/dL — ABNORMAL LOW
LDL Cholesterol (Calc): 55 mg/dL
Non-HDL Cholesterol (Calc): 75 mg/dL
Total CHOL/HDL Ratio: 2.9 (calc)
Triglycerides: 114 mg/dL

## 2021-12-15 LAB — HEMOGLOBIN A1C
Hgb A1c MFr Bld: 5.6 %{Hb}
Mean Plasma Glucose: 114 mg/dL
eAG (mmol/L): 6.3 mmol/L

## 2021-12-15 LAB — CBC WITH DIFFERENTIAL/PLATELET
Absolute Monocytes: 552 cells/uL (ref 200–950)
Basophils Absolute: 71 cells/uL (ref 0–200)
Basophils Relative: 0.8 %
Eosinophils Absolute: 80 cells/uL (ref 15–500)
Eosinophils Relative: 0.9 %
HCT: 38.9 % (ref 35.0–45.0)
Hemoglobin: 13.2 g/dL (ref 11.7–15.5)
Lymphs Abs: 1949 cells/uL (ref 850–3900)
MCH: 30.6 pg (ref 27.0–33.0)
MCHC: 33.9 g/dL (ref 32.0–36.0)
MCV: 90.3 fL (ref 80.0–100.0)
MPV: 9.9 fL (ref 7.5–12.5)
Monocytes Relative: 6.2 %
Neutro Abs: 6248 cells/uL (ref 1500–7800)
Neutrophils Relative %: 70.2 %
Platelets: 304 10*3/uL (ref 140–400)
RBC: 4.31 10*6/uL (ref 3.80–5.10)
RDW: 13.4 % (ref 11.0–15.0)
Total Lymphocyte: 21.9 %
WBC: 8.9 10*3/uL (ref 3.8–10.8)

## 2021-12-15 LAB — MICROALBUMIN, URINE: Microalb, Ur: 0.2 mg/dL

## 2021-12-15 LAB — COMPLETE METABOLIC PANEL WITH GFR
AG Ratio: 1.5 (calc) (ref 1.0–2.5)
ALT: 10 U/L (ref 6–29)
AST: 12 U/L (ref 10–35)
Albumin: 4.3 g/dL (ref 3.6–5.1)
Alkaline phosphatase (APISO): 68 U/L (ref 37–153)
BUN: 11 mg/dL (ref 7–25)
CO2: 26 mmol/L (ref 20–32)
Calcium: 9.9 mg/dL (ref 8.6–10.4)
Chloride: 100 mmol/L (ref 98–110)
Creat: 0.64 mg/dL (ref 0.50–1.05)
Globulin: 2.8 g/dL (calc) (ref 1.9–3.7)
Glucose, Bld: 103 mg/dL — ABNORMAL HIGH (ref 65–99)
Potassium: 5 mmol/L (ref 3.5–5.3)
Sodium: 138 mmol/L (ref 135–146)
Total Bilirubin: 0.4 mg/dL (ref 0.2–1.2)
Total Protein: 7.1 g/dL (ref 6.1–8.1)
eGFR: 101 mL/min/{1.73_m2} (ref >=60)

## 2021-12-15 LAB — TSH: TSH: 0.65 m[IU]/L (ref 0.40–4.50)

## 2021-12-26 ENCOUNTER — Other Ambulatory Visit: Payer: Self-pay | Admitting: Family Medicine

## 2021-12-28 NOTE — Telephone Encounter (Signed)
Requested Prescriptions  Pending Prescriptions Disp Refills  . levothyroxine (SYNTHROID) 112 MCG tablet [Pharmacy Med Name: LEVOTHYROXINE SODIUM 112 MCG Tablet] 90 tablet 3    Sig: TAKE 1 TABLET EVERY DAY BEFORE BREAKFAST (NEED MD APPOINTMENT)     Endocrinology:  Hypothyroid Agents Failed - 12/26/2021  2:44 AM      Failed - Valid encounter within last 12 months    Recent Outpatient Visits          1 year ago General medical exam   [facility] Donita Brooks, MD   2 years ago Essential hypertension   [facility] Tanya Nones, Priscille Heidelberg, MD   2 years ago Essential hypertension   [facility] Tanya Nones, Priscille Heidelberg, MD   3 years ago Essential hypertension   [facility] Tanya Nones, Priscille Heidelberg, MD   3 years ago Hypothyroidism, unspecified type   [facility] Pickard, Priscille Heidelberg, MD             Passed - TSH in normal range and within 360 days    TSH  Date Value Ref Range Status  12/14/2021 0.65 0.40 - 4.50 mIU/L Final

## 2022-01-19 ENCOUNTER — Encounter: Payer: Self-pay | Admitting: Family Medicine

## 2022-02-25 ENCOUNTER — Ambulatory Visit (HOSPITAL_COMMUNITY)
Admission: RE | Admit: 2022-02-25 | Discharge: 2022-02-25 | Disposition: A | Discharge status: Home / Self Care | Payer: Medicare HMO | Source: Ambulatory Visit | Attending: Family Medicine | Admitting: Family Medicine

## 2022-02-25 DIAGNOSIS — I700 Atherosclerosis of aorta: Secondary | ICD-10-CM | POA: Diagnosis not present

## 2022-02-25 DIAGNOSIS — Z122 Encounter for screening for malignant neoplasm of respiratory organs: Secondary | ICD-10-CM | POA: Diagnosis not present

## 2022-02-25 DIAGNOSIS — I2510 Atherosclerotic heart disease of native coronary artery without angina pectoris: Secondary | ICD-10-CM | POA: Insufficient documentation

## 2022-02-25 DIAGNOSIS — F17200 Nicotine dependence, unspecified, uncomplicated: Secondary | ICD-10-CM

## 2022-02-25 DIAGNOSIS — F17210 Nicotine dependence, cigarettes, uncomplicated: Secondary | ICD-10-CM | POA: Insufficient documentation

## 2022-03-01 ENCOUNTER — Other Ambulatory Visit: Payer: Self-pay

## 2022-03-01 DIAGNOSIS — F17200 Nicotine dependence, unspecified, uncomplicated: Secondary | ICD-10-CM

## 2022-04-21 ENCOUNTER — Ambulatory Visit: Payer: Medicare HMO | Admitting: Cardiology

## 2022-04-24 ENCOUNTER — Other Ambulatory Visit: Payer: Self-pay | Admitting: Family Medicine

## 2022-04-30 ENCOUNTER — Other Ambulatory Visit: Payer: Self-pay | Admitting: Family Medicine

## 2022-05-03 NOTE — Telephone Encounter (Signed)
Requested Prescriptions  Pending Prescriptions Disp Refills   ACCU-CHEK AVIVA PLUS test strip [Pharmacy Med Name: ACCU-CHEK AVIVA PLUS   Strip] 300 strip 3    Sig: USE AS INSTRUCTED     Endocrinology: Diabetes - Testing Supplies Failed - 04/30/2022  5:55 PM      Failed - Valid encounter within last 12 months    Recent Outpatient Visits           1 year ago General medical exam   [facility] Donita Brooks, MD   2 years ago Essential hypertension   [facility] Tanya Nones, Priscille Heidelberg, MD   3 years ago Essential hypertension   [facility] Tanya Nones, Priscille Heidelberg, MD   3 years ago Essential hypertension   [facility] Donita Brooks, MD   4 years ago Hypothyroidism, unspecified type   [facility] Pickard, Priscille Heidelberg, MD       Future Appointments             In 1 month Branch, Dorothe Pea, MD [facility] A Dept Of [place]. [facility] [place] [facility]             Accu-Chek Softclix Lancets lancets [Pharmacy Med Name: ACCU-CHEK SOFTCLIX LANCETS] 300 each 3    Sig: USE AS INSTRUCTED     Endocrinology: Diabetes - Testing Supplies Failed - 04/30/2022  5:55 PM      Failed - Valid encounter within last 12 months    Recent Outpatient Visits           1 year ago General medical exam   [facility] Donita Brooks, MD   2 years ago Essential hypertension   [facility] Tanya Nones Priscille Heidelberg, MD   3 years ago Essential hypertension   [facility] Tanya Nones Priscille Heidelberg, MD   3 years ago Essential hypertension   [facility] Donita Brooks, MD   4 years ago Hypothyroidism, unspecified type   [facility] Pickard, Priscille Heidelberg, MD       Future Appointments             In 1 month Branch, Dorothe Pea, MD  [facility] A Dept Of [place]. [facility] [place] [facility]             Alcohol  Swabs (DROPSAFE ALCOHOL PREP) 70 % PADS [Pharmacy Med Name: DROPSAFE ALCOHOL PREP PADS 70 % Pad] 300 each 3    Sig: USE AS DIRECTED     Off-Protocol Failed - 04/30/2022  5:55 PM      Failed - Medication not assigned to a protocol, review manually.      Failed - Valid encounter within last 12 months    Recent Outpatient Visits           1 year ago General medical exam   [facility] Donita Brooks, MD   2 years ago Essential hypertension   [facility] Tanya Nones, Priscille Heidelberg, MD   3 years ago Essential hypertension   [facility] Tanya Nones, Priscille Heidelberg, MD   3 years ago Essential hypertension   [facility] Tanya Nones, Priscille Heidelberg, MD   4 years ago Hypothyroidism, unspecified type   [facility] Pickard, Priscille Heidelberg, MD       Future Appointments  In 1 month Branch, Dorothe Pea, MD [facility] A Dept Of [place]igha Bridegroom. [facility] [address] [place] [facility]

## 2022-05-05 ENCOUNTER — Ambulatory Visit (INDEPENDENT_AMBULATORY_CARE_PROVIDER_SITE_OTHER): Payer: Medicare HMO

## 2022-05-05 VITALS — Ht 66.0 in | Wt 180.0 lb

## 2022-05-05 DIAGNOSIS — Z0000 Encounter for general adult medical examination without abnormal findings: Secondary | ICD-10-CM

## 2022-05-05 DIAGNOSIS — Z1211 Encounter for screening for malignant neoplasm of colon: Secondary | ICD-10-CM

## 2022-05-05 NOTE — Progress Notes (Signed)
Subjective:   [name] is a 61 y.o. female who presents for Medicare Annual (Subsequent) preventive examination. Virtual Visit via Telephone Note  I connected with  [name] on 05/05/22 at  2:10 PM EST by telephone and verified that I am speaking with the correct person using two identifiers.  Location: Patient: HOME Provider: [facility] Persons participating in the virtual visit: patient/Nurse Health Advisor   I discussed the limitations, risks, security and privacy concerns of performing an evaluation and management service by telephone and the availability of in person appointments. The patient expressed understanding and agreed to proceed.  Interactive audio and video telecommunications were attempted between this nurse and patient, however failed, due to patient having technical difficulties OR patient did not have access to video capability.  We continued and completed visit with audio only.  Some vital signs may be absent or patient reported.   Chriss Driver, LPN  Review of Systems     Cardiac Risk Factors include: advanced age (>65mn, >>27women);diabetes mellitus;hypertension;dyslipidemia;sedentary lifestyle     Objective:    Today's Vitals   05/05/22 1402  Weight: 180 lb (81.6 kg)  Height: _0  (1.676 m)   Body mass index is 29.05 kg/m.     05/05/2022    2:09 PM 04/30/2021   11:59 AM 09/26/2020    9:06 AM 01/04/2018    9:17 AM  Advanced Directives  Does Patient Have a Medical Advance Directive? No No No No  Would patient like information on creating a medical advance directive? No - Patient declined No - Patient declined No - Patient declined Yes (ED - Information included in AVS)    Current Medications (verified) Outpatient Encounter Medications as of 05/05/2022  Medication Sig   Accu-Chek Softclix Lancets lancets USE AS INSTRUCTED   Alcohol Swabs (DROPSAFE ALCOHOL PREP) 70 % PADS USE AS DIRECTED   amLODipine (NORVASC) 10 MG tablet TAKE 1  TABLET EVERY DAY   AREXVY 120 MCG/0.5ML injection    aspirin 325 MG tablet Take 325 mg by mouth daily.   Blood Glucose Calibration (ACCU-CHEK AVIVA) SOLN USE AS DIRECTED   Blood Glucose Monitoring Suppl (ACCU-CHEK AVIVA PLUS) w/Device KIT Use as directed   carvedilol (COREG) 25 MG tablet TAKE 1 TABLET TWICE DAILY WITH MEALS   FLUZONE QUADRIVALENT 0.5 ML injection    furosemide (LASIX) 20 MG tablet TAKE 1 TABLET EVERY DAY   glucose blood (ACCU-CHEK AVIVA PLUS) test strip USE AS INSTRUCTED   levothyroxine (SYNTHROID) 112 MCG tablet TAKE 1 TABLET EVERY DAY BEFORE BREAKFAST (NEED MD APPOINTMENT)   losartan (COZAAR) 100 MG tablet TAKE 1 TABLET EVERY DAY   metFORMIN (GLUCOPHAGE) 1000 MG tablet TAKE 1 TABLET TWICE DAILY WITH MEALS (NEED MD APPOINTMENT)   nitroGLYCERIN (NITROSTAT) 0.4 MG SL tablet Place 1 tablet (0.4 mg total) under the tongue every 5 (five) minutes as needed.   Omega-3 Fatty Acids (FISH OIL) 1000 MG CAPS Take by mouth.   omeprazole (PRILOSEC) 40 MG capsule TAKE 1 CAPSULE EVERY DAY   potassium chloride (KLOR-CON) 10 MEQ tablet    rosuvastatin (CRESTOR) 20 MG tablet TAKE 1 TABLET EVERY DAY   No facility-administered encounter medications on file as of 05/05/2022.    Allergies (verified) Patient has no known allergies.   History: Past Medical History:  Diagnosis Date   Arthritis    knees   Blood transfusion 08/2007   CHF (congestive heart failure) (HCC)    Claudication (HCC)    Coronary artery  disease    Diabetes mellitus    GERD (gastroesophageal reflux disease)    Hyperlipidemia    Hypertension    Hypothyroidism    PVD (peripheral vascular disease) (HCC)    PVD (peripheral vascular disease) (HCC)    Tobacco abuse    No past surgical history on file. Family History  Problem Relation Age of Onset   Heart disease Mother    Stroke Mother    Heart disease Father    Heart disease Maternal Grandmother    Stroke Maternal Grandmother    Heart disease Maternal  Grandfather    Kidney disease Paternal Grandmother    Heart disease Paternal Grandfather    Social History   Socioeconomic History   Marital status: Legally Separated    Spouse name: Not on file   Number of children: 0   Years of education: Not on file   Highest education level: Not on file  Occupational History   Not on file  Tobacco Use   Smoking status: Every Day    Packs/day: 0.50    Years: 32.00    Total pack years: 16.00    Types: Cigarettes   Smokeless tobacco: Never  Vaping Use   Vaping Use: Never used  Substance and Sexual Activity   Alcohol use: No   Drug use: No   Sexual activity: Yes  Other Topics Concern   Not on file  Social History Narrative   Not on file   Social Determinants of Health   Financial Resource Strain: Low Risk  (05/05/2022)   Overall Financial Resource Strain (CARDIA)    Difficulty of Paying Living Expenses: Not hard at all  Food Insecurity: No Food Insecurity (05/05/2022)   Hunger Vital Sign    Worried About Running Out of Food in the Last Year: Never true    Ran Out of Food in the Last Year: Never true  Transportation Needs: No Transportation Needs (05/05/2022)   PRAPARE - Hydrologist (Medical): No    Lack of Transportation (Non-Medical): No  Physical Activity: Insufficiently Active (05/05/2022)   Exercise Vital Sign    Days of Exercise per Week: 5 days    Minutes of Exercise per Session: 20 min  Stress: No Stress Concern Present (05/05/2022)   [place]    Feeling of Stress : Not at all  Social Connections: [place] (05/05/2022)   Social Connection and Isolation Panel [NHANES]    Frequency of Communication with Friends and Family: More than three times a week    Frequency of Social Gatherings with Friends and Family: More than three times a week    Attends Religious Services: More than 4 times per year    Active Member of [employer]  or Organizations: Yes    Attends Music therapist: More than 4 times per year    Marital Status: Married    Tobacco Counseling Ready to quit: Not Answered Counseling given: Not Answered   Clinical Intake:  Pre-visit preparation completed: Yes  Pain : No/denies pain     Nutritional Status: BMI 25 -29 Overweight Nutritional Risks: Other (Comment) Diabetes: Yes  How often do you need to have someone help you when you read instructions, pamphlets, or other written materials from your doctor or pharmacy?: 1 - Never  Diabetic?Nutrition Risk Assessment:  Has the patient had any N/V/D within the last 2 months?  No  Does the patient have any non-healing wounds?  No  Has the patient had any unintentional weight loss or weight gain?  No   Diabetes:  Is the patient diabetic?  Yes  If diabetic, was a CBG obtained today?  No  Did the patient bring in their glucometer from home?  No  How often do you monitor your CBG's? daily.   Financial Strains and Diabetes Management:  Are you having any financial strains with the device, your supplies or your medication? No .  Does the patient want to be seen by Chronic Care Management for management of their diabetes?  No  Would the patient like to be referred to a Nutritionist or for Diabetic Management?  No   Diabetic Exams:  Diabetic Eye Exam: Completed 08/2021. Pt has been advised about the importance in completing this exam.  Diabetic Foot Exam: Completed DUE. Pt has been advised about the importance in completing this exam.   Interpreter Needed?: No  Information entered by :: mj Hebe Merriwether, lpn   Activities of Daily Living    05/05/2022    2:11 PM  In your present state of health, do you have any difficulty performing the following activities:  Hearing? 0  Vision? 0  Difficulty concentrating or making decisions? 0  Walking or climbing stairs? 0  Dressing or bathing? 0  Doing errands, shopping? 0  Preparing Food and  eating ? N  Using the Toilet? N  In the past six months, have you accidently leaked urine? N  Do you have problems with loss of bowel control? N  Managing your Medications? N  Managing your Finances? N  Housekeeping or managing your Housekeeping? N    Patient Care Team: Susy Frizzle, MD as PCP - General (Family Medicine) Verl Blalock, Marijo Conception, MD (Inactive) as Attending Physician (Cardiology)  Indicate any recent Medical Services you may have received from other than [facility] providers in the past year (date may be approximate).     Assessment:   This is a routine wellness examination for [facility].  Hearing/Vision screen Hearing Screening - Comments:: No hearing issues.  Vision Screening - Comments:: Glasses. Last exam done at [facility] 08/2021 for diabetic retinopathy exam.   Dietary issues and exercise activities discussed: Current Exercise Habits: Home exercise routine, Type of exercise: walking, Time (Minutes): 20, Frequency (Times/Week): 5, Weekly Exercise (Minutes/Week): 100, Intensity: Mild, Exercise limited by: cardiac condition(s);orthopedic condition(s)   Goals Addressed             This Visit's Progress    Exercise 3x per week (30 min per time)       Continue to exercise.       Depression Screen    05/05/2022    2:07 PM 04/30/2021   11:56 AM 09/26/2020    9:05 AM 01/04/2018    8:54 AM 01/04/2018    8:26 AM 03/03/2017    8:46 AM 10/23/2015    8:07 AM  PHQ 2/9 Scores  PHQ - 2 Score 0 0 0 0 0 0 0  PHQ- 9 Score      0     Fall Risk    05/05/2022    2:11 PM 04/30/2021   11:59 AM 09/26/2020    9:05 AM 01/04/2018    8:54 AM 01/04/2018    8:26 AM  [place] in the past year? 0 0 0 No No  Number falls in past yr: 0 0     Injury with Fall? 0 0     Risk for fall due  to : No Fall Risks Impaired balance/gait;Orthopedic patient No Fall Risks    Follow up Falls prevention discussed Falls prevention discussed Falls evaluation completed      FALL RISK PREVENTION PERTAINING TO  THE HOME:  Any stairs in or around the home? Yes  If so, are there any without handrails? No  Home free of loose throw rugs in walkways, pet beds, electrical cords, etc? Yes  Adequate lighting in your home to reduce risk of falls? Yes   ASSISTIVE DEVICES UTILIZED TO PREVENT FALLS:  Life alert? No  Use of a cane, walker or w/c? No  Grab bars in the bathroom? No  Shower chair or bench in shower? No  Elevated toilet seat or a handicapped toilet? No   TIMED UP AND GO:  Was the test performed? No .  Phone visit  Cognitive Function:        05/05/2022    2:12 PM 04/30/2021   12:02 PM  6CIT Screen  What Year? 0 points 0 points  What month? 0 points 0 points  What time? 0 points 0 points  Count back from 20 0 points 0 points  Months in reverse 0 points 0 points  Repeat phrase 0 points 2 points  Total Score 0 points 2 points    Immunizations Immunization History  Administered Date(s) Administered   COVID-19, mRNA, vaccine(Comirnaty)12 years and older 02/25/2022   H1N1 03/29/2008   Influenza, High Dose Seasonal PF 03/07/2018   Influenza,inj,Quad PF,6+ Mos 02/20/2013, 05/09/2014, 03/04/2015, 03/26/2016, 03/03/2017, 03/08/2019   Influenza-Unspecified 04/16/2010, 02/02/2011, 02/22/2012, 03/07/2018, 03/11/2021, 02/17/2022   PFIZER(Purple Top)SARS-COV-2 Vaccination 10/11/2019, 11/02/2019, 05/19/2020   Pneumococcal Polysaccharide-23 08/30/2007, 09/05/2017   Respiratory Syncytial Virus Vaccine,Recomb Aduvanted(Arexvy) 02/17/2022   Td 05/03/2011   Tdap 05/03/2011, 09/26/2020   Zoster Recombinat (Shingrix) 02/12/2020, 01/12/2022    TDAP status: Up to date  Flu Vaccine status: Up to date  Pneumococcal vaccine status: Up to date  Covid-19 vaccine status: Completed vaccines  Qualifies for Shingles Vaccine? Yes   Zostavax completed Yes   Shingrix Completed?: Yes  Screening Tests Health Maintenance  Topic Date Due   Diabetic kidney evaluation - Urine ACR  06/30/2022  (Originally 03/13/1979)   FOOT EXAM  06/30/2022 (Originally 03/07/2020)   COLONOSCOPY (Pts 45-56yr Insurance coverage will need to be confirmed)  11/04/2022 (Originally 09/22/2021)   HEMOGLOBIN A1C  06/16/2022   OPHTHALMOLOGY EXAM  09/14/2022   MAMMOGRAM  11/27/2022   Diabetic kidney evaluation - GFR measurement  12/15/2022   Medicare Annual Wellness (AWV)  05/06/2023   PAP SMEAR-Modifier  05/28/2024   DTaP/Tdap/Td (4 - Td or Tdap) 09/27/2030   INFLUENZA VACCINE  Completed   COVID-19 Vaccine  Completed   Hepatitis C Screening  Completed   HIV Screening  Completed   Zoster Vaccines- Shingrix  Completed   HPV VACCINES  Aged Out    Health Maintenance  There are no preventive care reminders to display for this patient.   Colorectal cancer screening: Referral to GI placed 05/05/2022. Pt aware the office will call re: appt.  Mammogram status: Completed 11/26/2021. Repeat every year  Bone Density status: Ordered DUE AT AGE 89 Pt provided with contact info and advised to call to schedule appt.  Lung Cancer Screening: (Low Dose CT Chest recommended if Age 61-80years, 30 pack-year currently smoking OR have quit w/in 15years.) does qualify.   Lung Cancer Screening Referral: 02/25/2022-DONE  Additional Screening:  Hepatitis C Screening: does qualify; Completed 10/23/2015  Vision Screening: Recommended annual ophthalmology  exams for early detection of glaucoma and other disorders of the eye. Is the patient up to date with their annual eye exam?  Yes  Who is the provider or what is the name of the office in which the patient attends annual eye exams? [facility] 08/2021 If pt is not established with a provider, would they like to be referred to a provider to establish care? No .   Dental Screening: Recommended annual dental exams for proper oral hygiene  Community Resource Referral / Chronic Care Management: CRR required this visit?  No   CCM required this visit?  No      Plan:     I  have personally reviewed and noted the following in the patient's chart:   Medical and social history Use of alcohol, tobacco or illicit drugs  Current medications and supplements including opioid prescriptions. Patient is not currently taking opioid prescriptions. Functional ability and status Nutritional status Physical activity Advanced directives List of other physicians Hospitalizations, surgeries, and ER visits in previous 12 months Vitals Screenings to include cognitive, depression, and falls Referrals and appointments  In addition, I have reviewed and discussed with patient certain preventive protocols, quality metrics, and best practice recommendations. A written personalized care plan for preventive services as well as general preventive health recommendations were provided to patient.     Chriss Driver, LPN   [id number]/11/[id number]   Nurse Notes: Order placed for Colonoscopy. Pt states she would like to schedule for April 2024. Pt is up to date on vaccines.

## 2022-05-05 NOTE — Patient Instructions (Signed)
[name] , Thank you for taking time to come for your Medicare Wellness Visit. I appreciate your ongoing commitment to your health goals. Please review the following plan we discussed and let me know if I can assist you in the future.   These are the goals we discussed:  Goals      DIET - REDUCE CALORIE INTAKE     Exercise 3x per week (30 min per time)     Continue to exercise.        This is a list of the screening recommended for you and due dates:  Health Maintenance  Topic Date Due   Yearly kidney health urinalysis for diabetes  06/30/2022*   Complete foot exam   06/30/2022*   Colon Cancer Screening  11/04/2022*   Hemoglobin A1C  06/16/2022   Eye exam for diabetics  09/14/2022   Mammogram  11/27/2022   Yearly kidney function blood test for diabetes  12/15/2022   Medicare Annual Wellness Visit  05/06/2023   Pap Smear  05/28/2024   DTaP/Tdap/Td vaccine (4 - Td or Tdap) 09/27/2030   Flu Shot  Completed   COVID-19 Vaccine  Completed   Hepatitis C Screening: USPSTF Recommendation to screen - Ages 18-79 yo.  Completed   HIV Screening  Completed   Zoster (Shingles) Vaccine  Completed   HPV Vaccine  Aged Out  *Topic was postponed. The date shown is not the original due date.    Advanced directives: Advance directive discussed with you today. Even though you declined this today, please call our office should you change your mind, and we can give you the proper paperwork for you to fill out.   Conditions/risks identified: Aim for 30 minutes of exercise or brisk walking, 6-8 glasses of water, and 5 servings of fruits and vegetables each day.   Next appointment: Follow up in one year for your annual wellness visit. 05/2023.  Preventive Care 40-64 Years, Female Preventive care refers to lifestyle choices and visits with your health care provider that can promote health and wellness. What does preventive care include? A yearly physical exam. This is also called an annual well  check. Dental exams once or twice a year. Routine eye exams. Ask your health care provider how often you should have your eyes checked. Personal lifestyle choices, including: Daily care of your teeth and gums. Regular physical activity. Eating a healthy diet. Avoiding tobacco and drug use. Limiting alcohol use. Practicing safe sex. Taking low-dose aspirin daily starting at age 57. Taking vitamin and mineral supplements as recommended by your health care provider. What happens during an annual well check? The services and screenings done by your health care provider during your annual well check will depend on your age, overall health, lifestyle risk factors, and family history of disease. Counseling  Your health care provider may ask you questions about your: Alcohol use. Tobacco use. Drug use. Emotional well-being. Home and relationship well-being. Sexual activity. Eating habits. Work and work Statistician. Method of birth control. Menstrual cycle. Pregnancy history. Screening  You may have the following tests or measurements: Height, weight, and BMI. Blood pressure. Lipid and cholesterol levels. These may be checked every 5 years, or more frequently if you are over 49 years old. Skin check. Lung cancer screening. You may have this screening every year starting at age 64 if you have a 30-pack-year history of smoking and currently smoke or have quit within the past 15 years. Fecal occult blood test (FOBT) of the stool.  You may have this test every year starting at age 54. Flexible sigmoidoscopy or colonoscopy. You may have a sigmoidoscopy every 5 years or a colonoscopy every 10 years starting at age 14. Hepatitis C blood test. Hepatitis B blood test. Sexually transmitted disease (STD) testing. Diabetes screening. This is done by checking your blood sugar (glucose) after you have not eaten for a while (fasting). You may have this done every 1-3 years. Mammogram. This may be done  every 1-2 years. Talk to your health care provider about when you should start having regular mammograms. This may depend on whether you have a family history of breast cancer. BRCA-related cancer screening. This may be done if you have a family history of breast, ovarian, tubal, or peritoneal cancers. Pelvic exam and Pap test. This may be done every 3 years starting at age 54. Starting at age 39, this may be done every 5 years if you have a Pap test in combination with an HPV test. Bone density scan. This is done to screen for osteoporosis. You may have this scan if you are at high risk for osteoporosis. Discuss your test results, treatment options, and if necessary, the need for more tests with your health care provider. Vaccines  Your health care provider may recommend certain vaccines, such as: Influenza vaccine. This is recommended every year. Tetanus, diphtheria, and acellular pertussis (Tdap, Td) vaccine. You may need a Td booster every 10 years. Zoster vaccine. You may need this after age 19. Pneumococcal 13-valent conjugate (PCV13) vaccine. You may need this if you have certain conditions and were not previously vaccinated. Pneumococcal polysaccharide (PPSV23) vaccine. You may need one or two doses if you smoke cigarettes or if you have certain conditions. Talk to your health care provider about which screenings and vaccines you need and how often you need them. This information is not intended to replace advice given to you by your health care provider. Make sure you discuss any questions you have with your health care provider. Document Released: 06/13/2015 Document Revised: 02/04/2016 Document Reviewed: 03/18/2015 Elsevier Interactive Patient Education  2017 [place] Prevention in the Home Falls can cause injuries. They can happen to people of all ages. There are many things you can do to make your home safe and to help prevent falls. What can I do on the outside of my  home? Regularly fix the edges of walkways and driveways and fix any cracks. Remove anything that might make you trip as you walk through a door, such as a raised step or threshold. Trim any bushes or trees on the path to your home. Use bright outdoor lighting. Clear any walking paths of anything that might make someone trip, such as rocks or tools. Regularly check to see if handrails are loose or broken. Make sure that both sides of any steps have handrails. Any raised decks and porches should have guardrails on the edges. Have any leaves, snow, or ice cleared regularly. Use sand or salt on walking paths during winter. Clean up any spills in your garage right away. This includes oil or grease spills. What can I do in the bathroom? Use night lights. Install grab bars by the toilet and in the tub and shower. Do not use towel bars as grab bars. Use non-skid mats or decals in the tub or shower. If you need to sit down in the shower, use a plastic, non-slip stool. Keep the floor dry. Clean up any water that spills on  the floor as soon as it happens. Remove soap buildup in the tub or shower regularly. Attach bath mats securely with double-sided non-slip rug tape. Do not have throw rugs and other things on the floor that can make you trip. What can I do in the bedroom? Use night lights. Make sure that you have a light by your bed that is easy to reach. Do not use any sheets or blankets that are too big for your bed. They should not hang down onto the floor. Have a firm chair that has side arms. You can use this for support while you get dressed. Do not have throw rugs and other things on the floor that can make you trip. What can I do in the kitchen? Clean up any spills right away. Avoid walking on wet floors. Keep items that you use a lot in easy-to-reach places. If you need to reach something above you, use a strong step stool that has a grab bar. Keep electrical cords out of the way. Do  not use floor polish or wax that makes floors slippery. If you must use wax, use non-skid floor wax. Do not have throw rugs and other things on the floor that can make you trip. What can I do with my stairs? Do not leave any items on the stairs. Make sure that there are handrails on both sides of the stairs and use them. Fix handrails that are broken or loose. Make sure that handrails are as long as the stairways. Check any carpeting to make sure that it is firmly attached to the stairs. Fix any carpet that is loose or worn. Avoid having throw rugs at the top or bottom of the stairs. If you do have throw rugs, attach them to the floor with carpet tape. Make sure that you have a light switch at the top of the stairs and the bottom of the stairs. If you do not have them, ask someone to add them for you. What else can I do to help prevent falls? Wear shoes that: Do not have high heels. Have rubber bottoms. Are comfortable and fit you well. Are closed at the toe. Do not wear sandals. If you use a stepladder: Make sure that it is fully opened. Do not climb a closed stepladder. Make sure that both sides of the stepladder are locked into place. Ask someone to hold it for you, if possible. Clearly mark and make sure that you can see: Any grab bars or handrails. First and last steps. Where the edge of each step is. Use tools that help you move around (mobility aids) if they are needed. These include: Canes. Walkers. Scooters. Crutches. Turn on the lights when you go into a dark area. Replace any light bulbs as soon as they burn out. Set up your furniture so you have a clear path. Avoid moving your furniture around. If any of your floors are uneven, fix them. If there are any pets around you, be aware of where they are. Review your medicines with your doctor. Some medicines can make you feel dizzy. This can increase your chance of falling. Ask your doctor what other things that you can do to  help prevent falls. This information is not intended to replace advice given to you by your health care provider. Make sure you discuss any questions you have with your health care provider. Document Released: 03/13/2009 Document Revised: 10/23/2015 Document Reviewed: 06/21/2014 Elsevier Interactive Patient Education  2017 [employer].

## 2022-06-25 ENCOUNTER — Ambulatory Visit: Payer: Medicare HMO | Attending: Cardiology | Admitting: Cardiology

## 2022-06-25 ENCOUNTER — Encounter: Payer: Self-pay | Admitting: Cardiology

## 2022-06-25 VITALS — BP 120/65 | HR 62 | Ht 66.0 in | Wt 184.0 lb

## 2022-06-25 DIAGNOSIS — I739 Peripheral vascular disease, unspecified: Secondary | ICD-10-CM | POA: Diagnosis not present

## 2022-06-25 DIAGNOSIS — I10 Essential (primary) hypertension: Secondary | ICD-10-CM

## 2022-06-25 DIAGNOSIS — E782 Mixed hyperlipidemia: Secondary | ICD-10-CM | POA: Diagnosis not present

## 2022-06-25 DIAGNOSIS — I509 Heart failure, unspecified: Secondary | ICD-10-CM | POA: Diagnosis not present

## 2022-06-25 DIAGNOSIS — I110 Hypertensive heart disease with heart failure: Secondary | ICD-10-CM | POA: Diagnosis not present

## 2022-06-25 DIAGNOSIS — E1151 Type 2 diabetes mellitus with diabetic peripheral angiopathy without gangrene: Secondary | ICD-10-CM | POA: Diagnosis not present

## 2022-06-25 DIAGNOSIS — I2510 Atherosclerotic heart disease of native coronary artery without angina pectoris: Secondary | ICD-10-CM

## 2022-06-25 MED ORDER — ASPIRIN 81 MG PO TBEC: 81.0000 mg | DELAYED_RELEASE_TABLET | Freq: Every day | ORAL | 3 refills | Status: DC

## 2022-06-25 NOTE — Progress Notes (Signed)
Clinical Summary Ms. Gibb is a 62 y.o.female seen as new patient, last seen by our practice in 2015. Seen today for the following medical problems.  1. HTN - home bp's 120-140s/70s - compliant with meds    2. PAD - mildly abnormal ABI 02/2013, evidence of focal left SFA disease.  -04/2019 right abi normal, left abi moderate decreased.  - no claudication symptoms. Can have resting symptoms.    3. Hyperlipidmeia - compliant with crestor and niacin - 11/2013 TC 111 TG 87 HDL 36 LDL 58 - on niacin feels flushed all over, "feels like real bad sunburn" that lasts for 10-15 minutes after taking  11/2021 TC 114 HDL 39 TG 114 LDL 55   4. Tobacco - 1/2 ppd - has tried patches with some benefit, considering e-cig.    5. CAD - cath 2009 with CTO of RCA and otherwise moderate non-obstructive disease ,  - reports some occasional chest pain with high levels of exertion. Does not occur at rest. Resolves with rest. Stable symptoms over last several years  -no chest pains, no SOB/DOE   6. History of systolic dysfuntion - LVEF at that time 25-30% by LV gram in 2009 - LVEF has since normalized to 55-60% by echo 10/2013.   - no LE edema, no SOB/DOE.    7. OSA 03/2014 moderate OSA - wants to consider if she wants to reestsblish with pulmonary  Past Medical History:  Diagnosis Date   Arthritis    knees   Blood transfusion 08/2007   CHF (congestive heart failure) (HCC)    Claudication (HCC)    Coronary artery disease    Diabetes mellitus    GERD (gastroesophageal reflux disease)    Hyperlipidemia    Hypertension    Hypothyroidism    PVD (peripheral vascular disease) (HCC)    PVD (peripheral vascular disease) (HCC)    Tobacco abuse      No Known Allergies   Current Outpatient Medications  Medication Sig Dispense Refill   Accu-Chek Softclix Lancets lancets USE AS INSTRUCTED 300 each 2   Alcohol Swabs (DROPSAFE ALCOHOL PREP) 70 % PADS USE AS DIRECTED 300 each 2    amLODipine (NORVASC) 10 MG tablet TAKE 1 TABLET EVERY DAY 90 tablet 3   AREXVY 120 MCG/0.5ML injection      aspirin EC 81 MG tablet Take 1 tablet (81 mg total) by mouth daily. Swallow whole. 90 tablet 3   Blood Glucose Calibration (ACCU-CHEK AVIVA) SOLN USE AS DIRECTED 1 each 3   Blood Glucose Monitoring Suppl (ACCU-CHEK AVIVA PLUS) w/Device KIT Use as directed 1 kit 0   carvedilol (COREG) 25 MG tablet TAKE 1 TABLET TWICE DAILY WITH MEALS 180 tablet 3   FLUZONE QUADRIVALENT 0.5 ML injection      furosemide (LASIX) 20 MG tablet TAKE 1 TABLET EVERY DAY 90 tablet 3   glucose blood (ACCU-CHEK AVIVA PLUS) test strip USE AS INSTRUCTED 300 strip 2   levothyroxine (SYNTHROID) 112 MCG tablet TAKE 1 TABLET EVERY DAY BEFORE BREAKFAST (NEED MD APPOINTMENT) 90 tablet 3   losartan (COZAAR) 100 MG tablet TAKE 1 TABLET EVERY DAY 90 tablet 3   metFORMIN (GLUCOPHAGE) 1000 MG tablet TAKE 1 TABLET TWICE DAILY WITH MEALS (NEED MD APPOINTMENT) 180 tablet 3   nitroGLYCERIN (NITROSTAT) 0.4 MG SL tablet Place 1 tablet (0.4 mg total) under the tongue every 5 (five) minutes as needed. 100 tablet 3   Omega-3 Fatty Acids (FISH OIL) 1000 MG CAPS Take  by mouth.     omeprazole (PRILOSEC) 40 MG capsule TAKE 1 CAPSULE EVERY DAY 90 capsule 3   potassium chloride (KLOR-CON) 10 MEQ tablet      rosuvastatin (CRESTOR) 20 MG tablet TAKE 1 TABLET EVERY DAY 90 tablet 3   No current facility-administered medications for this visit.     No past surgical history on file.   No Known Allergies    Family History  Problem Relation Age of Onset   Heart disease Mother    Stroke Mother    Heart disease Father    Heart disease Maternal Grandmother    Stroke Maternal Grandmother    Heart disease Maternal Grandfather    Kidney disease Paternal Grandmother    Heart disease Paternal Grandfather      Social History Ms. Bruhl reports that she has been smoking cigarettes. She has a 16.00 pack-year smoking history. She has never  used smokeless tobacco. Ms. Shader reports no history of alcohol use.   Review of Systems CONSTITUTIONAL: No weight loss, fever, chills, weakness or fatigue.  HEENT: Eyes: No visual loss, blurred vision, double vision or yellow sclerae.No hearing loss, sneezing, congestion, runny nose or sore throat.  SKIN: No rash or itching.  CARDIOVASCULAR: per hpi RESPIRATORY: No shortness of breath, cough or sputum.  GASTROINTESTINAL: No anorexia, nausea, vomiting or diarrhea. No abdominal pain or blood.  GENITOURINARY: No burning on urination, no polyuria NEUROLOGICAL: No headache, dizziness, syncope, paralysis, ataxia, numbness or tingling in the extremities. No change in bowel or bladder control.  MUSCULOSKELETAL: No muscle, back pain, joint pain or stiffness.  LYMPHATICS: No enlarged nodes. No history of splenectomy.  PSYCHIATRIC: No history of depression or anxiety.  ENDOCRINOLOGIC: No reports of sweating, cold or heat intolerance. No polyuria or polydipsia.  Marland Kitchen   Physical Examination Today's Vitals   06/25/22 0832 06/25/22 0904  BP: (!) 141/70 120/65  Pulse: 62   SpO2: 96%   Weight: 184 lb (83.5 kg)   Height: 5\' 6"  (1.676 m)    Body mass index is 29.7 kg/m.  Gen: resting comfortably, no acute distress HEENT: no scleral icterus, pupils equal round and reactive, no palptable cervical adenopathy,  CV: RRR, no m/r/g, no jvd Resp: Clear to auscultation bilaterally GI: abdomen is soft, non-tender, non-distended, normal bowel sounds, no hepatosplenomegaly MSK: extremities are warm, no edema.  Skin: warm, no rash Neuro:  no focal deficits Psych: appropriate affect   Diagnostic Studies  10/2013 Echo Study Conclusions  - Left ventricle: The cavity size was normal. There was moderate concentric hypertrophy. Systolic function was normal. The estimated ejection fraction was in the range of 55% to 60%. Wall motion was normal; there were no regional wall motion abnormalities. Left  ventricular diastolic function parameters were normal. - Left atrium: The atrium was mildly dilated. - Tricuspid valve: There was trivial regurgitation. - Inferior vena cava: The vessel was normal in size. The respirophasic diameter changes were in the normal range (= 50%), consistent with normal central venous pressure.   02/2013 ABI <50% mid to distal right SFA RIght ABI 0.92   >50% focal stenosis left SFA Left ABI 0.81   08/2007 Cath HEMODYNAMIC DATA:  1. Right atrial pressure 11.  2. RV 37/13.  3. Pulmonary artery 36/20, mean 28.  4. Pulmonary capillary wedge 19.  5. LV 112/80.  6. Aortic 102/62, mean 78.  7. Superior vena cava saturation 61%.  8. Pulmonary artery saturation 64%.  9. Aortic saturation 96%.  10.Fick cardiac output 5.8  L/min.  11.Fick cardiac index 2.7 L/min/m2.  12.Thermodilution cardiac output through 7.3 L/min.  13.Thermodilution cardiac index 3.45 L/min/m2.  ANGIOGRAPHIC DATA:  1. Ventriculography in the RAO projection reveals a dilated left  ventricle which was poorly contractile. Ejection fraction to be  estimated at 25%-30%. In particular, the inferior wall appears to  be akinetic while everything else was hypokinetic.  2. The left main is free of critical disease.  3. The LAD courses to the apex. There is diffuse calcified disease in  the proximal midportion of the LAD with about 50%-60% narrowing.  There is a segmental smooth plaque in the mid distal LAD of 50%-60%  as well. There is diffuse luminal irregularity throughout the  diagonal and distal LAD system, but not critical stenosis.  4. The circumflex is a very large vessel. There are 2 tiny marginal  branches that are bit bend. In the bend, there is 50%-60%  narrowing. This was followed by a Noemie Devivo and another Yuri Flener. A  sub-Elye Harmsen has 50% narrowing and then a distal sub-Iraida Cragin has about  90% narrowing very peripherally.  5. The right coronary artery is subtotally occluded proximally and   then subtotally occluded again. There is competitive filling of  the mid vessel. There is evidence of collaterals from left to  right.  CONCLUSION:  1. Moderately severe to severe reduction in overall left ventricular  function, probably due to discontinuation of antihypertensive  medications and coronary artery disease.  2. Moderately severe scattered disease of the LAD and circumflex that  does not appear to be critical.  3. Collateralized right coronary artery that supplies what appears to  be probably nearly akinetic segment in the inferior wall.  DISPOSITION: The patient's compliance has been poor. She has stopped  medications on multiple occasions. She is currently hypothyroid, and as  a result we have restarted her Synthroid. Optimally, we will get her  back to euthyroid status at the present time, get her back on her  medications, and eventually follow her up. I think she can be treated  medically for now. At the present time, I am not sure that much would  be gained by percutaneous intervention of the right coronary artery but  I will review this with my colleagues. Most importantly, she will need  to discontinue smoking and take better care of herself, and then this  may be a significant challenge.    Assessment and Plan   1. HTN - bp at goal by manual recheck, we will continue currnt meds   2. PAD - asymptomatic, continue medical therapy.    3. Hyperlipidemia - at goal, continue current meds    4. CAD - no symptoms, continue current meds - EKG sinus brady, no acute ischemic changes.   5. OSA - difficultly tolerating cpap back in 2015 at time of diagnosis - we disucssed referring her to pulmonary for repeat evaluation to see if perhaps adjustements could be made, she will think about it and get back to [place]   F/u 6 months     Arnoldo Lenis, M.D.

## 2022-06-25 NOTE — Patient Instructions (Addendum)
Medication Instructions:  Your physician has recommended you make the following change in your medication:  - Stop Asprin 325 mg tablets - Start Aspirin 81 mg tablets once daily   Labwork: None  Testing/Procedures: None  Follow-Up: Follow up with Dr. Harl Bowie in 6 months.   Any Other Special Instructions Will Be Listed Below (If Applicable).     If you need a refill on your cardiac medications before your next appointment, please call your pharmacy.

## 2022-07-15 ENCOUNTER — Other Ambulatory Visit: Payer: Self-pay | Admitting: Family Medicine

## 2022-07-15 NOTE — Telephone Encounter (Signed)
Requested medication (s) are due for refill today:   Yes for all 4  Requested medication (s) are on the active medication list:   Yes for all 4  Future visit scheduled:   No  LOV 12/14/2021  physical and labs done   Last ordered: All 4 ordered 07/16/2021 #90, 3 refills  Returned because labs are due per protocol for every 6 months.   Requested Prescriptions  Pending Prescriptions Disp Refills   furosemide (LASIX) 20 MG tablet [Pharmacy Med Name: FUROSEMIDE 20 MG Tablet] 90 tablet 3    Sig: TAKE 1 TABLET EVERY DAY     Cardiovascular:  Diuretics - Loop Failed - 07/15/2022  2:22 AM      Failed - K in normal range and within 180 days    Potassium  Date Value Ref Range Status  12/14/2021 5.0 3.5 - 5.3 mmol/L Final         Failed - Ca in normal range and within 180 days    Calcium  Date Value Ref Range Status  12/14/2021 9.9 8.6 - 10.4 mg/dL Final         Failed - Na in normal range and within 180 days    Sodium  Date Value Ref Range Status  12/14/2021 138 135 - 146 mmol/L Final         Failed - Cr in normal range and within 180 days    Creat  Date Value Ref Range Status  12/14/2021 0.64 0.50 - 1.05 mg/dL Final   Creatinine,U  Date Value Ref Range Status  09/12/2007   Final   28.8 (NOTE)  Cutoff Values for Urine Drug Screen:        Drug Class           Cutoff (ng/mL)        Amphetamines            1000        Barbiturates             200        Cocaine Metabolites      300        Benzodiazepines          200        Methadone                 300        Opiates                 2000        Phencyclidine             25        Propoxyphene             300        Marijuana Metabolites     50  For medical purposes only.         Failed - Cl in normal range and within 180 days    Chloride  Date Value Ref Range Status  12/14/2021 100 98 - 110 mmol/L Final         Failed - Mg Level in normal range and within 180 days    Magnesium  Date Value Ref Range Status  09/19/2007 2.1   Final         Failed - Valid encounter within last 6 months    Recent Outpatient Visits           1 year ago General medical exam  [place] Pickard, Cammie Mcgee, MD   2 years ago Essential hypertension   [employer] Dennard Schaumann, Cammie Mcgee, MD   3 years ago Essential hypertension   [place], Cammie Mcgee, MD   3 years ago Essential hypertension   [place] Dennard Schaumann, Cammie Mcgee, MD   4 years ago Hypothyroidism, unspecified type   [place] Pickard, Cammie Mcgee, MD       Future Appointments             In 6 months Branch, Alphonse Guild, MD [place] at [facility], [place] BP in normal range    BP Readings from Last 1 Encounters:  06/25/22 120/65          omeprazole (PRILOSEC) 40 MG capsule [Pharmacy Med Name: OMEPRAZOLE 40 MG Capsule Delayed Release] 90 capsule 3    Sig: TAKE [place]     Gastroenterology: Proton Pump Inhibitors Failed - 07/15/2022  2:22 AM      Failed - Valid encounter within last 12 months    Recent Outpatient Visits           1 year ago General medical exam   [place] Susy Frizzle, MD   2 years ago Essential hypertension   [place] Dennard Schaumann, Cammie Mcgee, MD   3 years ago Essential hypertension   [place], Cammie Mcgee, MD   3 years ago Essential hypertension   [place], Cammie Mcgee, MD   4 years ago Hypothyroidism, unspecified type   [place] Pickard, Cammie Mcgee, MD       Future Appointments             In 6 months Branch, Alphonse Guild, MD [place] at [facility], [place]             losartan (COZAAR) 100 MG tablet [Pharmacy Med Name: LOSARTAN POTASSIUM 100 MG Tablet] 90 tablet 3    Sig: TAKE 1 TABLET EVERY DAY     Cardiovascular:  Angiotensin Receptor Blockers Failed -  07/15/2022  2:22 AM      Failed - Cr in normal range and within 180 days    Creat  Date Value Ref Range Status  12/14/2021 0.64 0.50 - 1.05 mg/dL Final   Creatinine,U  Date Value Ref Range Status  09/12/2007   Final   28.8 (NOTE)  Cutoff Values for Urine Drug Screen:        Drug Class           Cutoff (ng/mL)        Amphetamines            1000        Barbiturates             200        Cocaine Metabolites      300        Benzodiazepines          200        Methadone                 300        Opiates                 2000  Phencyclidine             25        Propoxyphene             300        Marijuana Metabolites     50  For medical purposes only.         Failed - K in normal range and within 180 days    Potassium  Date Value Ref Range Status  12/14/2021 5.0 3.5 - 5.3 mmol/L Final         Failed - Valid encounter within last 6 months    Recent Outpatient Visits           1 year ago General medical exam   [place] Susy Frizzle, MD   2 years ago Essential hypertension   [place] Dennard Schaumann, Cammie Mcgee, MD   3 years ago Essential hypertension   [place], Cammie Mcgee, MD   3 years ago Essential hypertension   [place] Dennard Schaumann, Cammie Mcgee, MD   4 years ago Hypothyroidism, unspecified type   [place] Pickard, Cammie Mcgee, MD       Future Appointments             In 6 months Branch, Alphonse Guild, MD [place] at [facility], [place] - Patient is not pregnant      Passed - Last BP in normal range    BP Readings from Last 1 Encounters:  06/25/22 120/65          rosuvastatin (CRESTOR) 20 MG tablet [Pharmacy Med Name: ROSUVASTATIN CALCIUM 20 MG Tablet] 90 tablet 3    Sig: TAKE 1 TABLET EVERY DAY     Cardiovascular:  Antilipid - Statins 2 Failed - 07/15/2022  2:22 AM      Failed - Valid encounter within last 12 months    Recent  Outpatient Visits           1 year ago General medical exam   [place] Susy Frizzle, MD   2 years ago Essential hypertension   [place], Cammie Mcgee, MD   3 years ago Essential hypertension   [place], Cammie Mcgee, MD   3 years ago Essential hypertension   [place], Cammie Mcgee, MD   4 years ago Hypothyroidism, unspecified type   [place], Cammie Mcgee, MD       Future Appointments             In 6 months Branch, Alphonse Guild, MD [place] at [facility], [place]            Failed - Lipid Panel in normal range within the last 12 months    Cholesterol  Date Value Ref Range Status  12/14/2021 114 <200 mg/dL Final   LDL Cholesterol (Calc)  Date Value Ref Range Status  12/14/2021 55 mg/dL (calc) Final    Comment:    Reference range: <100 . Desirable range <100 mg/dL for primary prevention;   <70 mg/dL for patients with CHD or diabetic patients  with > or = 2 CHD risk factors. Marland Kitchen LDL-C is now calculated using the Martin-Hopkins  calculation, which is a validated novel method providing  better accuracy than  the Friedewald equation in the  estimation of LDL-C.  Cresenciano Genre et al. Annamaria Helling. [id number]): 2061-2068  (http://education.[employer].com/faq/FAQ164)    HDL  Date Value Ref Range Status  12/14/2021 39 (L) > OR = 50 mg/dL Final   Triglycerides  Date Value Ref Range Status  12/14/2021 114 <150 mg/dL Final         Passed - Cr in normal range and within 360 days    Creat  Date Value Ref Range Status  12/14/2021 0.64 0.50 - 1.05 mg/dL Final   Creatinine,U  Date Value Ref Range Status  09/12/2007   Final   28.8 (NOTE)  Cutoff Values for Urine Drug Screen:        Drug Class           Cutoff (ng/mL)        Amphetamines            1000        Barbiturates             200        Cocaine Metabolites      300         Benzodiazepines          200        Methadone                 300        Opiates                 2000        Phencyclidine             25        Propoxyphene             300        Marijuana Metabolites     50  For medical purposes only.         Passed - Patient is not pregnant

## 2022-08-25 ENCOUNTER — Other Ambulatory Visit: Payer: Self-pay | Admitting: Family Medicine

## 2022-08-26 NOTE — Telephone Encounter (Signed)
Patient needs OV, will refill medication for 30 days until OV can be made. OV needed for additional refills.  Requested Prescriptions  Pending Prescriptions Disp Refills   amLODipine (NORVASC) 10 MG tablet [Pharmacy Med Name: AMLODIPINE BESYLATE 10 MG Tablet] 30 tablet 0    Sig: TAKE 1 TABLET EVERY DAY     Cardiovascular: Calcium Channel Blockers 2 Failed - 08/25/2022 10:46 AM      Failed - Valid encounter within last 6 months    Recent Outpatient Visits           1 year ago General medical exam   [place] Susy Frizzle, MD   2 years ago Essential hypertension   [place] Dennard Schaumann, Cammie Mcgee, MD   3 years ago Essential hypertension   [place], Cammie Mcgee, MD   3 years ago Essential hypertension   [place] Dennard Schaumann, Cammie Mcgee, MD   4 years ago Hypothyroidism, unspecified type   [place] Pickard, Cammie Mcgee, MD       Future Appointments             In 5 months Branch, Alphonse Guild, MD [place] at [place] BP in normal range    BP Readings from Last 1 Encounters:  06/25/22 120/65         Passed - Last Heart Rate in normal range    Pulse Readings from Last 1 Encounters:  06/25/22 62

## 2022-08-27 ENCOUNTER — Other Ambulatory Visit: Payer: Self-pay | Admitting: Family Medicine

## 2022-08-27 NOTE — Telephone Encounter (Signed)
Called pt to make an appt. Pt will call back to make appt.

## 2022-08-27 NOTE — Telephone Encounter (Signed)
Courtesy refill. Patient will need an office visit for further refills. Requested Prescriptions  Pending Prescriptions Disp Refills   carvedilol (COREG) 25 MG tablet [Pharmacy Med Name: CARVEDILOL 25 MG Tablet] 30 tablet 0    Sig: TAKE 1 TABLET TWICE DAILY WITH MEALS     Cardiovascular: Beta Blockers 3 Failed - 08/27/2022 10:22 AM      Failed - Valid encounter within last 6 months    Recent Outpatient Visits           1 year ago General medical exam   [place] Susy Frizzle, MD   2 years ago Essential hypertension   [place] Dennard Schaumann, Cammie Mcgee, MD   3 years ago Essential hypertension   [place], Cammie Mcgee, MD   3 years ago Essential hypertension   [place], Cammie Mcgee, MD   4 years ago Hypothyroidism, unspecified type   [place] Susy Frizzle, MD       Future Appointments             In 5 months Branch, Alphonse Guild, MD [place] at [facility], [place] in normal range and within 360 days    Creat  Date Value Ref Range Status  12/14/2021 0.64 0.50 - 1.05 mg/dL Final   Creatinine,U  Date Value Ref Range Status  09/12/2007   Final   28.8 (NOTE)  Cutoff Values for Urine Drug Screen:        Drug Class           Cutoff (ng/mL)        Amphetamines            1000        Barbiturates             200        Cocaine Metabolites      300        Benzodiazepines          200        Methadone                 300        Opiates                 2000        Phencyclidine             25        Propoxyphene             300        Marijuana Metabolites     50  For medical purposes only.         Passed - AST in normal range and within 360 days    AST  Date Value Ref Range Status  12/14/2021 12 10 - 35 U/L Final         Passed - ALT in normal range and within 360 days    ALT  Date Value Ref Range Status  12/14/2021 10 6 - 29 U/L  Final         Passed - Last BP in normal range    BP Readings from Last 1 Encounters:  06/25/22 120/65         Passed - Last Heart Rate in normal range    Pulse Readings  from Last 1 Encounters:  06/25/22 62

## 2022-09-05 ENCOUNTER — Other Ambulatory Visit: Payer: Self-pay | Admitting: Family Medicine

## 2022-09-07 NOTE — Telephone Encounter (Signed)
Requested medications are due for refill today.  yes  Requested medications are on the active medications list.  yes  Last refill. 07/15/2022 #60 for all 3  Future visit scheduled.   yes  Notes to clinic.  I would only be able to refill a small amount. Please review.     Requested Prescriptions  Pending Prescriptions Disp Refills   losartan (COZAAR) 100 MG tablet [Pharmacy Med Name: LOSARTAN POTASSIUM 100 MG Tablet] 60 tablet 5    Sig: TAKE 1 TABLET EVERY DAY (NEED MD APPOINTMENT FOR REFILLS)     Cardiovascular:  Angiotensin Receptor Blockers Failed - 09/05/2022  3:15 AM      Failed - Cr in normal range and within 180 days    Creat  Date Value Ref Range Status  12/14/2021 0.64 0.50 - 1.05 mg/dL Final   Creatinine,U  Date Value Ref Range Status  09/12/2007   Final   28.8 (NOTE)  Cutoff Values for Urine Drug Screen:        Drug Class           Cutoff (ng/mL)        Amphetamines            1000        Barbiturates             200        Cocaine Metabolites      300        Benzodiazepines          200        Methadone                 300        Opiates                 2000        Phencyclidine             25        Propoxyphene             300        Marijuana Metabolites     50  For medical purposes only.         Failed - K in normal range and within 180 days    Potassium  Date Value Ref Range Status  12/14/2021 5.0 3.5 - 5.3 mmol/L Final         Failed - Valid encounter within last 6 months    Recent Outpatient Visits           1 year ago General medical exam   [facility] Donita Brooks, MD   2 years ago Essential hypertension   [facility] Tanya Nones, Priscille Heidelberg, MD   3 years ago Essential hypertension   [facility] Tanya Nones, Priscille Heidelberg, MD   4 years ago Essential hypertension   [facility] Donita Brooks, MD   4 years ago Hypothyroidism, unspecified type   [facility] Pickard, Priscille Heidelberg, MD       Future Appointments             In 1 week Pickard, Priscille Heidelberg, MD [facility], [facility]   In 5 months Branch, Dorothe Pea, MD [facility] at [facility] [place] [facility]            Passed - Patient is not pregnant  Passed - Last BP in normal range    BP Readings from Last 1 Encounters:  06/25/22 120/65          omeprazole (PRILOSEC) 40 MG capsule [Pharmacy Med Name: OMEPRAZOLE 40 MG Capsule Delayed Release] 60 capsule 5    Sig: TAKE 1 CAPSULE EVERY DAY (NEED MD APPOINTMENT FOR REFILLS)     Gastroenterology: Proton Pump Inhibitors Failed - 09/05/2022  3:15 AM      Failed - Valid encounter within last 12 months    Recent Outpatient Visits           1 year ago General medical exam   [facility] Donita Brooks, MD   2 years ago Essential hypertension   [facility] Tanya Nones, Priscille Heidelberg, MD   3 years ago Essential hypertension   [facility] Tanya Nones, Priscille Heidelberg, MD   4 years ago Essential hypertension   [facility] Donita Brooks, MD   4 years ago Hypothyroidism, unspecified type   [facility] Pickard, Priscille Heidelberg, MD       Future Appointments             In 1 week Pickard, Priscille Heidelberg, MD [facility], [facility]   In 5 months Branch, Dorothe Pea, MD [facility] at [facility], [place] [facility]             furosemide (LASIX) 20 MG tablet [Pharmacy Med Name: FUROSEMIDE 20 MG Tablet] 60 tablet 5    Sig: TAKE 1 TABLET EVERY DAY (NEED MD APPOINTMENT FOR REFILLS)     Cardiovascular:  Diuretics - Loop Failed - 09/05/2022  3:15 AM      Failed - K in normal range and within 180 days    Potassium  Date Value Ref Range Status  12/14/2021 5.0 3.5 - 5.3 mmol/L Final         Failed - Ca in normal range and within 180 days    Calcium  Date Value Ref Range Status  12/14/2021 9.9 8.6 - 10.4 mg/dL Final         Failed - Na in  normal range and within 180 days    Sodium  Date Value Ref Range Status  12/14/2021 138 135 - 146 mmol/L Final         Failed - Cr in normal range and within 180 days    Creat  Date Value Ref Range Status  12/14/2021 0.64 0.50 - 1.05 mg/dL Final   Creatinine,U  Date Value Ref Range Status  09/12/2007   Final   28.8 (NOTE)  Cutoff Values for Urine Drug Screen:        Drug Class           Cutoff (ng/mL)        Amphetamines            1000        Barbiturates             200        Cocaine Metabolites      300        Benzodiazepines          200        Methadone                 300        Opiates  2000        Phencyclidine             25        Propoxyphene             300        Marijuana Metabolites     50  For medical purposes only.         Failed - Cl in normal range and within 180 days    Chloride  Date Value Ref Range Status  12/14/2021 100 98 - 110 mmol/L Final         Failed - Mg Level in normal range and within 180 days    Magnesium  Date Value Ref Range Status  09/19/2007 2.1  Final         Failed - Valid encounter within last 6 months    Recent Outpatient Visits           1 year ago General medical exam   [facility] Donita BrooksPickard, Warren T, MD   2 years ago Essential hypertension   [facility] Tanya NonesPickard, Priscille HeidelbergWarren T, MD   3 years ago Essential hypertension   [facility] Tanya NonesPickard, Priscille HeidelbergWarren T, MD   4 years ago Essential hypertension   [facility] Donita BrooksPickard, Warren T, MD   4 years ago Hypothyroidism, unspecified type   [facility] Pickard, Priscille HeidelbergWarren T, MD       Future Appointments             In 1 week Pickard, Priscille HeidelbergWarren T, MD [facility]   In 5 months Branch, Dorothe PeaJonathan F, MD [facility], [place]            Passed - Last BP in normal range    BP Readings from Last 1 Encounters:  06/25/22 120/65

## 2022-09-08 ENCOUNTER — Other Ambulatory Visit: Payer: Medicare HMO

## 2022-09-08 DIAGNOSIS — E118 Type 2 diabetes mellitus with unspecified complications: Secondary | ICD-10-CM | POA: Diagnosis not present

## 2022-09-08 DIAGNOSIS — Z6835 Body mass index (BMI) 35.0-35.9, adult: Secondary | ICD-10-CM | POA: Diagnosis not present

## 2022-09-08 DIAGNOSIS — E039 Hypothyroidism, unspecified: Secondary | ICD-10-CM | POA: Diagnosis not present

## 2022-09-08 DIAGNOSIS — I10 Essential (primary) hypertension: Secondary | ICD-10-CM | POA: Diagnosis not present

## 2022-09-08 DIAGNOSIS — E7849 Other hyperlipidemia: Secondary | ICD-10-CM | POA: Diagnosis not present

## 2022-09-08 DIAGNOSIS — I739 Peripheral vascular disease, unspecified: Secondary | ICD-10-CM

## 2022-09-08 LAB — CBC WITH DIFFERENTIAL/PLATELET
Basophils Relative: 0.8 %
MCH: 29.4 pg (ref 27.0–33.0)
MCHC: 32.6 g/dL (ref 32.0–36.0)
RBC: 4.05 10*6/uL (ref 3.80–5.10)

## 2022-09-09 LAB — HEMOGLOBIN A1C
Hgb A1c MFr Bld: 6.2 % of total Hgb — ABNORMAL HIGH (ref <5.7)
Mean Plasma Glucose: 131 mg/dL
eAG (mmol/L): 7.3 mmol/L

## 2022-09-09 LAB — COMPLETE METABOLIC PANEL WITH GFR
AG Ratio: 1.6 (calc) (ref 1.0–2.5)
ALT: 9 U/L (ref 6–29)
AST: 11 U/L (ref 10–35)
Albumin: 4.3 g/dL (ref 3.6–5.1)
Alkaline phosphatase (APISO): 91 U/L (ref 37–153)
BUN: 18 mg/dL (ref 7–25)
CO2: 27 mmol/L (ref 20–32)
Calcium: 9.7 mg/dL (ref 8.6–10.4)
Chloride: 101 mmol/L (ref 98–110)
Creat: 0.59 mg/dL (ref 0.50–1.05)
Globulin: 2.7 g/dL (calc) (ref 1.9–3.7)
Glucose, Bld: 93 mg/dL (ref 65–99)
Potassium: 4.7 mmol/L (ref 3.5–5.3)
Sodium: 138 mmol/L (ref 135–146)
Total Bilirubin: 0.4 mg/dL (ref 0.2–1.2)
Total Protein: 7 g/dL (ref 6.1–8.1)
eGFR: 102 mL/min/{1.73_m2} (ref >=60)

## 2022-09-09 LAB — TSH: TSH: 0.91 mIU/L (ref 0.40–4.50)

## 2022-09-09 LAB — CBC WITH DIFFERENTIAL/PLATELET
Absolute Monocytes: 437 cells/uL (ref 200–950)
Basophils Absolute: 62 cells/uL (ref 0–200)
Eosinophils Absolute: 62 cells/uL (ref 15–500)
Eosinophils Relative: 0.8 %
HCT: 36.5 % (ref 35.0–45.0)
Hemoglobin: 11.9 g/dL (ref 11.7–15.5)
Lymphs Abs: 1880 cells/uL (ref 850–3900)
MCV: 90.1 fL (ref 80.0–100.0)
MPV: 9.9 fL (ref 7.5–12.5)
Monocytes Relative: 5.6 %
Neutro Abs: 5359 cells/uL (ref 1500–7800)
Neutrophils Relative %: 68.7 %
Platelets: 303 10*3/uL (ref 140–400)
RDW: 13.8 % (ref 11.0–15.0)
Total Lymphocyte: 24.1 %
WBC: 7.8 10*3/uL (ref 3.8–10.8)

## 2022-09-09 LAB — LIPID PANEL
Cholesterol: 124 mg/dL (ref <200)
HDL: 33 mg/dL — ABNORMAL LOW (ref >=50)
LDL Cholesterol (Calc): 70 mg/dL (calc)
Non-HDL Cholesterol (Calc): 91 mg/dL (calc) (ref <130)
Total CHOL/HDL Ratio: 3.8 (calc) (ref <5.0)
Triglycerides: 128 mg/dL (ref <150)

## 2022-09-10 ENCOUNTER — Other Ambulatory Visit: Payer: Self-pay | Admitting: Family Medicine

## 2022-09-10 NOTE — Telephone Encounter (Signed)
Requested Prescriptions  Refused Prescriptions Disp Refills   carvedilol (COREG) 25 MG tablet [Pharmacy Med Name: CARVEDILOL 25 MG Tablet] 30 tablet 0    Sig: TAKE 1 TABLET TWICE DAILY WITH MEALS     Cardiovascular: Beta Blockers 3 Failed - 09/10/2022 11:37 AM      Failed - Valid encounter within last 6 months    Recent Outpatient Visits           1 year ago General medical exam   [facility] Donita Brooks, MD   2 years ago Essential hypertension   [facility] Tanya Nones, Priscille Heidelberg, MD   3 years ago Essential hypertension   [facility] Tanya Nones, Priscille Heidelberg, MD   4 years ago Essential hypertension   [facility] Tanya Nones, Priscille Heidelberg, MD   4 years ago Hypothyroidism, unspecified type   [facility] Pickard, Priscille Heidelberg, MD       Future Appointments             In 4 days Tanya Nones, Priscille Heidelberg, MD [facility], [facility]   In 5 months Branch, Dorothe Pea, MD [facility] at [facility], [place] [facility]            Passed - Cr in normal range and within 360 days    Creat  Date Value Ref Range Status  09/08/2022 0.59 0.50 - 1.05 mg/dL Final   Creatinine,U  Date Value Ref Range Status  09/12/2007   Final   28.8 (NOTE)  Cutoff Values for Urine Drug Screen:        Drug Class           Cutoff (ng/mL)        Amphetamines            1000        Barbiturates             200        Cocaine Metabolites      300        Benzodiazepines          200        Methadone                 300        Opiates                 2000        Phencyclidine             25        Propoxyphene             300        Marijuana Metabolites     50  For medical purposes only.         Passed - AST in normal range and within 360 days    AST  Date Value Ref Range Status  09/08/2022 11 10 - 35 U/L Final         Passed - ALT in normal range and within 360 days    ALT  Date Value Ref Range Status  09/08/2022 9 6 -  29 U/L Final         Passed - Last BP in normal range    BP Readings from Last 1 Encounters:  06/25/22 120/65         Passed - Last Heart Rate in normal range  Pulse Readings from Last 1 Encounters:  06/25/22 62

## 2022-09-14 ENCOUNTER — Encounter: Payer: Self-pay | Admitting: Family Medicine

## 2022-09-14 ENCOUNTER — Ambulatory Visit (HOSPITAL_COMMUNITY)
Admission: RE | Admit: 2022-09-14 | Discharge: 2022-09-14 | Disposition: A | Discharge status: Home / Self Care | Payer: Medicare HMO | Source: Ambulatory Visit | Attending: Family Medicine | Admitting: Family Medicine

## 2022-09-14 ENCOUNTER — Ambulatory Visit (INDEPENDENT_AMBULATORY_CARE_PROVIDER_SITE_OTHER): Payer: Medicare HMO | Admitting: Family Medicine

## 2022-09-14 VITALS — BP 122/62 | HR 57 | Temp 97.8°F | Ht 66.0 in | Wt 180.4 lb

## 2022-09-14 DIAGNOSIS — I10 Essential (primary) hypertension: Secondary | ICD-10-CM | POA: Diagnosis not present

## 2022-09-14 DIAGNOSIS — R0689 Other abnormalities of breathing: Secondary | ICD-10-CM

## 2022-09-14 DIAGNOSIS — E118 Type 2 diabetes mellitus with unspecified complications: Secondary | ICD-10-CM

## 2022-09-14 DIAGNOSIS — I739 Peripheral vascular disease, unspecified: Secondary | ICD-10-CM

## 2022-09-14 DIAGNOSIS — J9811 Atelectasis: Secondary | ICD-10-CM | POA: Diagnosis not present

## 2022-09-14 DIAGNOSIS — I252 Old myocardial infarction: Secondary | ICD-10-CM

## 2022-09-14 DIAGNOSIS — R0989 Other specified symptoms and signs involving the circulatory and respiratory systems: Secondary | ICD-10-CM | POA: Diagnosis not present

## 2022-09-14 DIAGNOSIS — E039 Hypothyroidism, unspecified: Secondary | ICD-10-CM | POA: Diagnosis not present

## 2022-09-14 MED ORDER — EZETIMIBE 10 MG PO TABS
10.0000 mg | ORAL_TABLET | Freq: Every day | ORAL | 3 refills | Status: AC
Start: 2022-09-14 — End: ?

## 2022-09-14 NOTE — Progress Notes (Signed)
Subjective:    Patient ID: [name], female    DOB: 1960-09-05, 62 y.o.   MRN: [id number]  Patient is a very pleasant 62 year old Caucasian female who presents today for follow-up.  She has a history of congestive heart failure as well as peripheral vascular disease with abnormal ABI in the left leg in 2020.  She denies claudication.  She denies chest pain.  She denies shortness of breath or dyspnea on exertion.  Unfortunately she continues to smoke.  She is in the precontemplative phase and has no desire to quit.  She denies any chest pain.  Her blood pressure today is well-controlled.  Her most recent lab work is outstanding.  Her A1c is 6.2.  However her LDL cholesterol is 70.  According to the new guidelines her goal LDL cholesterol will be less than 55.  I am unable to appreciate any palpable pulses on her foot exam today.  Her foot is warm and well-perfused however there is not a strong dorsalis pedis or posterior tibialis pulse.  She does have normal sensation to 10 g monofilament Lab on 09/08/2022  Component Date Value Ref Range Status   WBC 09/08/2022 7.8  3.8 - 10.8 Thousand/uL Final   RBC 09/08/2022 4.05  3.80 - 5.10 Million/uL Final   Hemoglobin 09/08/2022 11.9  11.7 - 15.5 g/dL Final   HCT 78/29/5621 36.5  35.0 - 45.0 % Final   MCV 09/08/2022 90.1  80.0 - 100.0 fL Final   MCH 09/08/2022 29.4  27.0 - 33.0 pg Final   MCHC 09/08/2022 32.6  32.0 - 36.0 g/dL Final   RDW 30/86/5784 13.8  11.0 - 15.0 % Final   Platelets 09/08/2022 303  140 - 400 Thousand/uL Final   MPV 09/08/2022 9.9  7.5 - 12.5 fL Final   Neutro Abs 09/08/2022 5,359  1,500 - 7,800 cells/uL Final   Lymphs Abs 09/08/2022 1,880  850 - 3,900 cells/uL Final   Absolute Monocytes 09/08/2022 437  200 - 950 cells/uL Final   Eosinophils Absolute 09/08/2022 62  15 - 500 cells/uL Final   Basophils Absolute 09/08/2022 62  0 - 200 cells/uL Final   Neutrophils Relative % 09/08/2022 68.7  % Final   Total Lymphocyte 09/08/2022  24.1  % Final   Monocytes Relative 09/08/2022 5.6  % Final   Eosinophils Relative 09/08/2022 0.8  % Final   Basophils Relative 09/08/2022 0.8  % Final   Glucose, Bld 09/08/2022 93  65 - 99 mg/dL Final   Comment: .            Fasting reference interval .    BUN 09/08/2022 18  7 - 25 mg/dL Final   Creat 69/62/9528 0.59  0.50 - 1.05 mg/dL Final   eGFR 41/32/4401 102  > OR = 60 mL/min/1.28m2 Final   BUN/Creatinine Ratio 09/08/2022 SEE NOTE:  6 - 22 (calc) Final   Comment:    Not Reported: BUN and Creatinine are within    reference range. .    Sodium 09/08/2022 138  135 - 146 mmol/L Final   Potassium 09/08/2022 4.7  3.5 - 5.3 mmol/L Final   Chloride 09/08/2022 101  98 - 110 mmol/L Final   CO2 09/08/2022 27  20 - 32 mmol/L Final   Calcium 09/08/2022 9.7  8.6 - 10.4 mg/dL Final   Total Protein 02/72/5366 7.0  6.1 - 8.1 g/dL Final   Albumin 44/07/4740 4.3  3.6 - 5.1 g/dL Final   Globulin 59/56/3875 2.7  1.9 -  3.7 g/dL (calc) Final   AG Ratio 09/08/2022 1.6  1.0 - 2.5 (calc) Final   Total Bilirubin 09/08/2022 0.4  0.2 - 1.2 mg/dL Final   Alkaline phosphatase (APISO) 09/08/2022 91  37 - 153 U/L Final   AST 09/08/2022 11  10 - 35 U/L Final   ALT 09/08/2022 9  6 - 29 U/L Final   Hgb A1c MFr Bld 09/08/2022 6.2 (H)  <5.7 % of total Hgb Final   Comment: For someone without known diabetes, a hemoglobin  A1c value between 5.7% and 6.4% is consistent with prediabetes and should be confirmed with a  follow-up test. . For someone with known diabetes, a value <7% indicates that their diabetes is well controlled. A1c targets should be individualized based on duration of diabetes, age, comorbid conditions, and other considerations. . This assay result is consistent with an increased risk of diabetes. . Currently, no consensus exists regarding use of hemoglobin A1c for diagnosis of diabetes for children. .    Mean Plasma Glucose 09/08/2022 131  mg/dL Final   eAG (mmol/L) 16/03/9603 7.3   mmol/L Final   Comment: . This test was performed on the Roche cobas c503 platform. Effective 03/08/22, a change in test platforms from the [name] Architect to the Roche cobas c503 may have shifted HbA1c results compared to historical results. Based on laboratory validation testing conducted at [facility], the Roche platform relative to the [name] platform had an average increase in HbA1c value of < or = 0.3%. This difference is within accepted  variability established by the [facility]. Note that not all individuals will have had a shift in their results and direct comparisons between historical and current results for testing conducted on different platforms is not recommended.    Cholesterol 09/08/2022 124  <200 mg/dL Final   HDL 54/01/8118 33 (L)  > OR = 50 mg/dL Final   Triglycerides 14/78/2956 128  <150 mg/dL Final   LDL Cholesterol (Calc) 09/08/2022 70  mg/dL (calc) Final   Comment: Reference range: <100 . Desirable range <100 mg/dL for primary prevention;   <70 mg/dL for patients with CHD or diabetic patients  with > or = 2 CHD risk factors. Marland Kitchen LDL-C is now calculated using the Martin-Hopkins  calculation, which is a validated novel method providing  better accuracy than the Friedewald equation in the  estimation of LDL-C.  Horald Pollen et al. Lenox Ahr. 2130;865(78): 2061-2068  (http://education.[facility].com/faq/FAQ164)    Total CHOL/HDL Ratio 09/08/2022 3.8  <4.6 (calc) Final   Non-HDL Cholesterol (Calc) 09/08/2022 91  <130 mg/dL (calc) Final   Comment: For patients with diabetes plus 1 major ASCVD risk  factor, treating to a non-HDL-C goal of <100 mg/dL  (LDL-C of <96 mg/dL) is considered a therapeutic  option.    TSH 09/08/2022 0.91  0.40 - 4.50 mIU/L Final    Past Medical History:  Diagnosis Date   Arthritis    knees   Blood transfusion 08/2007   CHF (congestive heart failure)    Claudication    Coronary artery disease     Diabetes mellitus    GERD (gastroesophageal reflux disease)    Hyperlipidemia    Hypertension    Hypothyroidism    PVD (peripheral vascular disease)    PVD (peripheral vascular disease)    Tobacco abuse    No past surgical history on file. Current Outpatient Medications on File Prior to Visit  Medication Sig Dispense Refill   Accu-Chek Softclix Lancets lancets USE AS INSTRUCTED  300 each 2   Alcohol Swabs (DROPSAFE ALCOHOL PREP) 70 % PADS USE AS DIRECTED 300 each 2   amLODipine (NORVASC) 10 MG tablet TAKE 1 TABLET EVERY DAY 30 tablet 0   AREXVY 120 MCG/0.5ML injection      aspirin EC 81 MG tablet Take 1 tablet (81 mg total) by mouth daily. Swallow whole. 90 tablet 3   Blood Glucose Calibration (ACCU-CHEK AVIVA) SOLN USE AS DIRECTED 1 each 3   Blood Glucose Monitoring Suppl (ACCU-CHEK AVIVA PLUS) w/Device KIT Use as directed 1 kit 0   carvedilol (COREG) 25 MG tablet TAKE 1 TABLET TWICE DAILY WITH MEALS 30 tablet 0   FLUZONE QUADRIVALENT 0.5 ML injection      furosemide (LASIX) 20 MG tablet TAKE 1 TABLET EVERY DAY (NEED MD APPOINTMENT FOR REFILLS) 60 tablet 5   glucose blood (ACCU-CHEK AVIVA PLUS) test strip USE AS INSTRUCTED 300 strip 2   levothyroxine (SYNTHROID) 112 MCG tablet TAKE 1 TABLET EVERY DAY BEFORE BREAKFAST (NEED MD APPOINTMENT) 90 tablet 3   losartan (COZAAR) 100 MG tablet TAKE 1 TABLET EVERY DAY (NEED MD APPOINTMENT FOR REFILLS) 60 tablet 5   metFORMIN (GLUCOPHAGE) 1000 MG tablet TAKE 1 TABLET TWICE DAILY WITH MEALS (NEED MD APPOINTMENT) 180 tablet 3   nitroGLYCERIN (NITROSTAT) 0.4 MG SL tablet Place 1 tablet (0.4 mg total) under the tongue every 5 (five) minutes as needed. 100 tablet 3   Omega-3 Fatty Acids (FISH OIL) 1000 MG CAPS Take by mouth.     omeprazole (PRILOSEC) 40 MG capsule TAKE 1 CAPSULE EVERY DAY (NEED MD APPOINTMENT FOR REFILLS) 60 capsule 5   rosuvastatin (CRESTOR) 20 MG tablet TAKE 1 TABLET EVERY DAY 90 tablet 3   potassium chloride (KLOR-CON) 10 MEQ tablet   (Patient not taking: Reported on 09/14/2022)     No current facility-administered medications on file prior to visit.   No Known Allergies Social History   Socioeconomic History   Marital status: Legally Separated    Spouse name: Not on file   Number of children: 0   Years of education: Not on file   Highest education level: Not on file  Occupational History   Not on file  Tobacco Use   Smoking status: Every Day    Packs/day: 0.50    Years: 32.00    Additional pack years: 0.00    Total pack years: 16.00    Types: Cigarettes   Smokeless tobacco: Never  Vaping Use   Vaping Use: Never used  Substance and Sexual Activity   Alcohol use: No   Drug use: No   Sexual activity: Yes  Other Topics Concern   Not on file  Social History Narrative   Not on file   Social Determinants of Health   Financial Resource Strain: Low Risk  (09/10/2022)   Overall Financial Resource Strain (CARDIA)    Difficulty of Paying Living Expenses: Not hard at all  Food Insecurity: No Food Insecurity (09/10/2022)   Hunger Vital Sign    Worried About Running Out of Food in the Last Year: Never true    Ran Out of Food in the Last Year: Never true  Transportation Needs: Unmet Transportation Needs (09/10/2022)   PRAPARE - Transportation    Lack of Transportation (Medical): Yes    Lack of Transportation (Non-Medical): Yes  Physical Activity: Sufficiently Active (09/10/2022)   Exercise Vital Sign    Days of Exercise per Week: 5 days    Minutes of Exercise per Session: 30 min  Stress: No Stress Concern Present (09/10/2022)   [employer] of Occupational Health - Occupational Stress Questionnaire    Feeling of Stress : Not at all  Social Connections: Unknown (09/10/2022)   Social Connection and Isolation Panel [NHANES]    Frequency of Communication with Friends and Family: More than three times a week    Frequency of Social Gatherings with Friends and Family: Once a week    Attends Religious Services: 1  to 4 times per year    Active Member of [employer] or Organizations: No    Attends Engineer, structural: More than 4 times per year    Marital Status: Patient declined  Intimate Partner Violence: Not At Risk (05/05/2022)   Humiliation, Afraid, Rape, and Kick questionnaire    Fear of Current or Ex-Partner: No    Emotionally Abused: No    Physically Abused: No    Sexually Abused: No     Review of Systems  All other systems reviewed and are negative.      Objective:   Physical Exam Vitals reviewed.  Constitutional:      General: She is not in acute distress.    Appearance: She is well-developed. She is not diaphoretic.  HENT:     Mouth/Throat:     Pharynx: No oropharyngeal exudate.  Eyes:     General: No scleral icterus.    Conjunctiva/sclera: Conjunctivae normal.  Neck:     Thyroid: No thyromegaly.     Vascular: No JVD.  Cardiovascular:     Rate and Rhythm: Normal rate and regular rhythm.     Heart sounds: Murmur heard.     No friction rub. No gallop.  Pulmonary:     Effort: Pulmonary effort is normal. No respiratory distress.     Breath sounds: Rales present. No wheezing.  Abdominal:     General: Bowel sounds are normal. There is no distension.     Palpations: Abdomen is soft.     Tenderness: There is no abdominal tenderness. There is no guarding or rebound.  Musculoskeletal:     Cervical back: Neck supple.  Lymphadenopathy:     Cervical: No cervical adenopathy.     Assessment & Plan:  Controlled type 2 diabetes mellitus with complication, without long-term current use of insulin  Hypothyroidism, unspecified type  PVD (peripheral vascular disease)  Essential hypertension  Old myocardial infarction  Abnormal breath sounds - Plan: DG Chest 2 View Patient has bilateral Rales in both lungs.  Have asked her to go get a chest x-ray today as I was not expecting this.  Clinically she appears normal and she denies any fever or chills or chest pain or shortness  of breath.  Await the results of the chest x-ray.  Her blood pressure, A1c are outstanding.  I will add Zetia and Crestor to try to drop her LDL cholesterol to less than 55 and I continue to encourage her to quit smoking but she has no desire to the present time.  She has diminished pulses in both feet but she is asymptomatic so I will not repeat arterial Dopplers at this time.

## 2022-09-16 ENCOUNTER — Other Ambulatory Visit: Payer: Self-pay | Admitting: Family Medicine

## 2022-09-16 NOTE — Telephone Encounter (Signed)
Requested Prescriptions  Pending Prescriptions Disp Refills   amLODipine (NORVASC) 10 MG tablet [Pharmacy Med Name: AMLODIPINE BESYLATE 10 MG Tablet] 90 tablet 0    Sig: TAKE 1 TABLET EVERY DAY     Cardiovascular: Calcium Channel Blockers 2 Failed - 09/16/2022  3:04 AM      Failed - Valid encounter within last 6 months    Recent Outpatient Visits           1 year ago General medical exam   [facility] Donita Brooks, MD   2 years ago Essential hypertension   [facility] Tanya Nones, Priscille Heidelberg, MD   3 years ago Essential hypertension   [facility] Tanya Nones, Priscille Heidelberg, MD   4 years ago Essential hypertension   [facility] Tanya Nones, Priscille Heidelberg, MD   4 years ago Hypothyroidism, unspecified type   [facility] Pickard, Priscille Heidelberg, MD       Future Appointments             In 4 months Branch, Dorothe Pea, MD [facility] [place] [facility]            Passed - Last BP in normal range    BP Readings from Last 1 Encounters:  09/14/22 122/62         Passed - Last Heart Rate in normal range    Pulse Readings from Last 1 Encounters:  09/14/22 (!) 57

## 2022-11-04 ENCOUNTER — Encounter: Payer: Self-pay | Admitting: Internal Medicine

## 2022-11-11 ENCOUNTER — Telehealth: Payer: Self-pay | Admitting: Family Medicine

## 2022-11-11 NOTE — Telephone Encounter (Signed)
Left message to return call for scheduling diabetic retinal eye exam.

## 2022-11-17 ENCOUNTER — Ambulatory Visit: Payer: Medicare HMO

## 2022-11-17 DIAGNOSIS — E118 Type 2 diabetes mellitus with unspecified complications: Secondary | ICD-10-CM

## 2022-11-17 NOTE — Progress Notes (Signed)
Pt here for diabetic retinopathy screening.

## 2022-11-24 ENCOUNTER — Ambulatory Visit (AMBULATORY_SURGERY_CENTER): Payer: Medicare HMO | Admitting: *Deleted

## 2022-11-24 VITALS — Ht 66.0 in | Wt 180.0 lb

## 2022-11-24 DIAGNOSIS — Z1211 Encounter for screening for malignant neoplasm of colon: Secondary | ICD-10-CM

## 2022-11-24 MED ORDER — NA SULFATE-K SULFATE-MG SULF 17.5-3.13-1.6 GM/177ML PO SOLN
1.0000 | Freq: Once | ORAL | 0 refills | Status: AC
Start: 2022-11-24 — End: 2022-11-24

## 2022-11-24 NOTE — Progress Notes (Signed)
Pt has never had nitroglycerin tablet  Pt's previsit is done over the phone and all paperwork (prep instructions) sent to patient.  Pt's name and DOB verified at the beginning of the previsit.  Pt denies any difficulty with ambulating.   No egg or soy allergy known to patient  No issues known to pt with past sedation with any surgeries or procedure Pt denies trouble moving neck No FH of Malignant Hyperthermia Pt is not on diet pills Pt is not on  home 02  Pt is not on blood thinners  Pt denies issues with constipation  Pt is not on dialysis Pt denies any upcoming cardiac testing Pt encouraged to use to use Singlecare or Goodrx to reduce cost

## 2022-11-26 ENCOUNTER — Encounter: Payer: Self-pay | Admitting: Internal Medicine

## 2022-11-28 ENCOUNTER — Other Ambulatory Visit: Payer: Self-pay | Admitting: Family Medicine

## 2022-11-30 NOTE — Telephone Encounter (Signed)
Last OV 09/14/22, within protocol.  Requested Prescriptions  Pending Prescriptions Disp Refills   amLODipine (NORVASC) 10 MG tablet [Pharmacy Med Name: AMLODIPINE BESYLATE 10 MG Tablet] 90 tablet 0    Sig: TAKE 1 TABLET EVERY DAY     Cardiovascular: Calcium Channel Blockers 2 Failed - 11/28/2022  5:30 AM      Failed - Valid encounter within last 6 months    Recent Outpatient Visits           2 years ago General medical exam   [facility] Donita Brooks, MD   3 years ago Essential hypertension   [facility] Tanya Nones, Priscille Heidelberg, MD   3 years ago Essential hypertension   [facility] Tanya Nones, Priscille Heidelberg, MD   4 years ago Essential hypertension   [facility] Tanya Nones, Priscille Heidelberg, MD   4 years ago Hypothyroidism, unspecified type   [facility] Pickard, Priscille Heidelberg, MD       Future Appointments             In 2 months Branch, Dorothe Pea, MD [facility] at [facility] [place] [facility]            Passed - Last BP in normal range    BP Readings from Last 1 Encounters:  09/14/22 122/62         Passed - Last Heart Rate in normal range    Pulse Readings from Last 1 Encounters:  09/14/22 (!) 57

## 2022-12-09 ENCOUNTER — Other Ambulatory Visit: Payer: Self-pay | Admitting: Family Medicine

## 2022-12-09 ENCOUNTER — Ambulatory Visit (AMBULATORY_SURGERY_CENTER): Payer: Medicare HMO | Admitting: Internal Medicine

## 2022-12-09 ENCOUNTER — Encounter: Payer: Self-pay | Admitting: Internal Medicine

## 2022-12-09 VITALS — BP 122/60 | HR 61 | Temp 96.8°F | Resp 25 | Ht 66.0 in | Wt 180.0 lb

## 2022-12-09 DIAGNOSIS — Z1211 Encounter for screening for malignant neoplasm of colon: Secondary | ICD-10-CM | POA: Diagnosis not present

## 2022-12-09 DIAGNOSIS — D122 Benign neoplasm of ascending colon: Secondary | ICD-10-CM | POA: Diagnosis not present

## 2022-12-09 DIAGNOSIS — I509 Heart failure, unspecified: Secondary | ICD-10-CM | POA: Diagnosis not present

## 2022-12-09 DIAGNOSIS — E119 Type 2 diabetes mellitus without complications: Secondary | ICD-10-CM | POA: Diagnosis not present

## 2022-12-09 DIAGNOSIS — I2510 Atherosclerotic heart disease of native coronary artery without angina pectoris: Secondary | ICD-10-CM | POA: Diagnosis not present

## 2022-12-09 DIAGNOSIS — D123 Benign neoplasm of transverse colon: Secondary | ICD-10-CM | POA: Diagnosis not present

## 2022-12-09 DIAGNOSIS — E039 Hypothyroidism, unspecified: Secondary | ICD-10-CM | POA: Diagnosis not present

## 2022-12-09 DIAGNOSIS — G4730 Sleep apnea, unspecified: Secondary | ICD-10-CM | POA: Diagnosis not present

## 2022-12-09 HISTORY — PX: COLONOSCOPY WITH PROPOFOL: SHX5780

## 2022-12-09 MED ORDER — SODIUM CHLORIDE 0.9 % IV SOLN: 500.0000 mL | Freq: Once | INTRAVENOUS | Status: DC

## 2022-12-09 NOTE — Progress Notes (Signed)
Called to room to assist during endoscopic procedure.  Patient ID and intended procedure confirmed with present staff. Received instructions for my participation in the procedure from the performing physician.  

## 2022-12-09 NOTE — Patient Instructions (Addendum)
Resume previous medications.  5 polyps removed and sent to pathology.   Await results for final recommendations.   Handouts on findings given to patient. ( Polyps and hemorrhoids)                           - Discharge patient to home (with escort).                           - The findings and recommendations were discussed                            with the patient.     YOU HAD AN ENDOSCOPIC PROCEDURE TODAY AT THE  ENDOSCOPY CENTER:   Refer to the procedure report that was given to you for any specific questions about what was found during the examination.  If the procedure report does not answer your questions, please call your gastroenterologist to clarify.  If you requested that your care partner not be given the details of your procedure findings, then the procedure report has been included in a sealed envelope for you to review at your convenience later.  YOU SHOULD EXPECT: Some feelings of bloating in the abdomen. Passage of more gas than usual.  Walking can help get rid of the air that was put into your GI tract during the procedure and reduce the bloating. If you had a lower endoscopy (such as a colonoscopy or flexible sigmoidoscopy) you may notice spotting of blood in your stool or on the toilet paper. If you underwent a bowel prep for your procedure, you may not have a normal bowel movement for a few days.  Please Note:  You might notice some irritation and congestion in your nose or some drainage.  This is from the oxygen used during your procedure.  There is no need for concern and it should clear up in a day or so.  SYMPTOMS TO REPORT IMMEDIATELY:  Following lower endoscopy (colonoscopy or flexible sigmoidoscopy):  Excessive amounts of blood in the stool  Significant tenderness or worsening of abdominal pains  Swelling of the abdomen that is new, acute  Fever of 100F or higher   For urgent or emergent issues, a gastroenterologist can be reached at any hour by calling  [phone]. Do not use MyChart messaging for urgent concerns.    DIET:  We do recommend a small meal at first, but then you may proceed to your regular diet.  Drink plenty of fluids but you should avoid alcoholic beverages for 24 hours.  ACTIVITY:  You should plan to take it easy for the rest of today and you should NOT DRIVE or use heavy machinery until tomorrow (because of the sedation medicines used during the test).    FOLLOW UP: Our staff will call the number listed on your records the next business day following your procedure.  We will call around 7:15- 8:00 am to check on you and address any questions or concerns that you may have regarding the information given to you following your procedure. If we do not reach you, we will leave a message.     If any biopsies were taken you will be contacted by phone or by letter within the next 1-3 weeks.  Please call us at [phone] if you have not heard about the biopsies in 3 weeks.    SIGNATURES/CONFIDENTIALITY:  You and/or your care partner have signed paperwork which will be entered into your electronic medical record.  These signatures attest to the fact that that the information above on your After Visit Summary has been reviewed and is understood.  Full responsibility of the confidentiality of this discharge information lies with you and/or your care-partner.

## 2022-12-09 NOTE — Op Note (Signed)
[facility] Patient Name: [name] Procedure Date: 12/09/2022 12:17 PM MRN: [id number] Endoscopist: Madelyn Brunner [place] , , [id number] Age: 62 Referring MD:  Date of Birth: [birth year] Gender: Female Account #: [id number] Procedure:                Colonoscopy Indications:              Screening for colorectal malignant neoplasm Medicines:                Monitored Anesthesia Care Procedure:                Pre-Anesthesia Assessment:                           - Prior to the procedure, a History and Physical                            was performed, and patient medications and                            allergies were reviewed. The patient's tolerance of                            previous anesthesia was also reviewed. The risks                            and benefits of the procedure and the sedation                            options and risks were discussed with the patient.                            All questions were answered, and informed consent                            was obtained. Prior Anticoagulants: The patient has                            taken no anticoagulant or antiplatelet agents. ASA                            Grade Assessment: III - A patient with severe                            systemic disease. After reviewing the risks and                            benefits, the patient was deemed in satisfactory                            condition to undergo the procedure.                           After obtaining informed consent, the colonoscope  was passed under direct vision. Throughout the                            procedure, the patient's blood pressure, pulse, and                            oxygen saturations were monitored continuously. The                            CF [id number] #[id number] was introduced through the anus                            and advanced to the the terminal ileum. The                             colonoscopy was performed without difficulty. The                            patient tolerated the procedure well. The quality                            of the bowel preparation was good. The terminal                            ileum, ileocecal valve, appendiceal orifice, and                            rectum were photographed. Scope In: 12:22:50 PM Scope Out: 12:40:15 PM Scope Withdrawal Time: 0 hours 13 minutes 23 seconds  Total Procedure Duration: 0 hours 17 minutes 25 seconds  Findings:                 The terminal ileum appeared normal.                           Five sessile polyps were found in the transverse                            colon and ascending colon. The polyps were 3 to 10                            mm in size. These polyps were removed with a cold                            snare. Resection and retrieval were complete.                           Non-bleeding internal hemorrhoids were found during                            retroflexion. Complications:            No immediate complications. Estimated Blood Loss:     Estimated blood loss was minimal. Impression:               -  The examined portion of the ileum was normal.                           - Five 3 to 10 mm polyps in the transverse colon                            and in the ascending colon, removed with a cold                            snare. Resected and retrieved.                           - Non-bleeding internal hemorrhoids. Recommendation:           - Discharge patient to home (with escort).                           - Await pathology results.                           - The findings and recommendations were discussed                            with the patient. Dr Particia Lather "Alan Ripper" Leonides Schanz,  12/09/2022 12:43:29 PM

## 2022-12-09 NOTE — Progress Notes (Signed)
GASTROENTEROLOGY PROCEDURE H&P NOTE   Primary Care Physician: Donita Brooks, MD    Reason for Procedure:   Colon cancer screening  Plan:    Colonoscopy  Patient is appropriate for endoscopic procedure(s) in the ambulatory ([facility]) setting.  The nature of the procedure, as well as the risks, benefits, and alternatives were carefully and thoroughly reviewed with the patient. Ample time for discussion and questions allowed. The patient understood, was satisfied, and agreed to proceed.     HPI: [name] is a 62 y.o. female who presents for colonoscopy for colon cancer screening. Denies blood in stools, changes in bowel habits, or unintentional weight loss. Denies family history of colon cancer.  Past Medical History:  Diagnosis Date   Anemia    Arthritis    knees   Blood transfusion 08/2007   CHF (congestive heart failure) (HCC)    Claudication (HCC)    Coronary artery disease    Diabetes mellitus    GERD (gastroesophageal reflux disease)    Hyperlipidemia    Hypertension    Hypothyroidism    PVD (peripheral vascular disease) (HCC)    PVD (peripheral vascular disease) (HCC)    Sleep apnea    does not wear CPAP   Tobacco abuse     Past Surgical History:  Procedure Laterality Date   COLONOSCOPY      Prior to Admission medications   Medication Sig Start Date End Date Taking? Authorizing Provider  ACCU-CHEK AVIVA PLUS test strip USE AS INSTRUCTED 12/09/22  Yes Donita Brooks, MD  Accu-Chek Softclix Lancets lancets USE AS INSTRUCTED 12/09/22  Yes Donita Brooks, MD  Alcohol Swabs (DROPSAFE ALCOHOL PREP) 70 % PADS USE AS DIRECTED 12/09/22  Yes Donita Brooks, MD  amLODipine (NORVASC) 10 MG tablet TAKE 1 TABLET EVERY DAY 11/30/22  Yes Donita Brooks, MD  aspirin EC 325 MG tablet Take 325 mg by mouth daily.   Yes [provider]  Blood Glucose Calibration (ACCU-CHEK AVIVA) SOLN USE AS DIRECTED 01/26/19  Yes Donita Brooks, MD  Blood Glucose  Monitoring Suppl (ACCU-CHEK AVIVA PLUS) w/Device KIT Use as directed 09/20/16  Yes Donita Brooks, MD  carvedilol (COREG) 25 MG tablet TAKE 1 TABLET TWICE DAILY WITH MEALS 08/27/22  Yes Donita Brooks, MD  ezetimibe (ZETIA) 10 MG tablet Take 1 tablet (10 mg total) by mouth daily. 09/14/22  Yes Donita Brooks, MD  furosemide (LASIX) 20 MG tablet TAKE 1 TABLET EVERY DAY (NEED MD APPOINTMENT FOR REFILLS) 09/07/22  Yes Donita Brooks, MD  levothyroxine (SYNTHROID) 112 MCG tablet TAKE 1 TABLET EVERY DAY BEFORE BREAKFAST (NEED MD APPOINTMENT) 12/28/21  Yes Donita Brooks, MD  losartan (COZAAR) 100 MG tablet TAKE 1 TABLET EVERY DAY (NEED MD APPOINTMENT FOR REFILLS) 09/07/22  Yes Donita Brooks, MD  metFORMIN (GLUCOPHAGE) 1000 MG tablet TAKE 1 TABLET TWICE DAILY WITH MEALS (NEED MD APPOINTMENT) 04/26/22  Yes Donita Brooks, MD  omeprazole (PRILOSEC) 40 MG capsule TAKE 1 CAPSULE EVERY DAY (NEED MD APPOINTMENT FOR REFILLS) 09/07/22  Yes Donita Brooks, MD  rosuvastatin (CRESTOR) 20 MG tablet TAKE 1 TABLET EVERY DAY 07/15/22  Yes Donita Brooks, MD  AREXVY 120 MCG/0.5ML injection  02/17/22   [provider]  aspirin EC 81 MG tablet Take 1 tablet (81 mg total) by mouth daily. Swallow whole. Patient not taking: Reported on 11/24/2022 06/25/22   Antoine Poche, MD  FLUZONE QUADRIVALENT 0.5 ML injection  02/17/22  [provider]  nitroGLYCERIN (NITROSTAT) 0.4 MG SL tablet Place 1 tablet (0.4 mg total) under the tongue every 5 (five) minutes as needed. Patient not taking: Reported on 11/24/2022 12/14/21   Donita Brooks, MD  Omega-3 Fatty Acids (FISH OIL) 1000 MG CAPS Take by mouth.    [provider]    Current Outpatient Medications  Medication Sig Dispense Refill   ACCU-CHEK AVIVA PLUS test strip USE AS INSTRUCTED 300 strip 3   Accu-Chek Softclix Lancets lancets USE AS INSTRUCTED 300 each 3   Alcohol Swabs (DROPSAFE ALCOHOL PREP) 70 % PADS USE AS DIRECTED 300  each 3   amLODipine (NORVASC) 10 MG tablet TAKE 1 TABLET EVERY DAY 90 tablet 0   aspirin EC 325 MG tablet Take 325 mg by mouth daily.     Blood Glucose Calibration (ACCU-CHEK AVIVA) SOLN USE AS DIRECTED 1 each 3   Blood Glucose Monitoring Suppl (ACCU-CHEK AVIVA PLUS) w/Device KIT Use as directed 1 kit 0   carvedilol (COREG) 25 MG tablet TAKE 1 TABLET TWICE DAILY WITH MEALS 30 tablet 0   ezetimibe (ZETIA) 10 MG tablet Take 1 tablet (10 mg total) by mouth daily. 90 tablet 3   furosemide (LASIX) 20 MG tablet TAKE 1 TABLET EVERY DAY (NEED MD APPOINTMENT FOR REFILLS) 60 tablet 5   levothyroxine (SYNTHROID) 112 MCG tablet TAKE 1 TABLET EVERY DAY BEFORE BREAKFAST (NEED MD APPOINTMENT) 90 tablet 3   losartan (COZAAR) 100 MG tablet TAKE 1 TABLET EVERY DAY (NEED MD APPOINTMENT FOR REFILLS) 60 tablet 5   metFORMIN (GLUCOPHAGE) 1000 MG tablet TAKE 1 TABLET TWICE DAILY WITH MEALS (NEED MD APPOINTMENT) 180 tablet 3   omeprazole (PRILOSEC) 40 MG capsule TAKE 1 CAPSULE EVERY DAY (NEED MD APPOINTMENT FOR REFILLS) 60 capsule 5   rosuvastatin (CRESTOR) 20 MG tablet TAKE 1 TABLET EVERY DAY 90 tablet 3   AREXVY 120 MCG/0.5ML injection      aspirin EC 81 MG tablet Take 1 tablet (81 mg total) by mouth daily. Swallow whole. (Patient not taking: Reported on 11/24/2022) 90 tablet 3   FLUZONE QUADRIVALENT 0.5 ML injection      nitroGLYCERIN (NITROSTAT) 0.4 MG SL tablet Place 1 tablet (0.4 mg total) under the tongue every 5 (five) minutes as needed. (Patient not taking: Reported on 11/24/2022) 100 tablet 3   Omega-3 Fatty Acids (FISH OIL) 1000 MG CAPS Take by mouth.     Current Facility-Administered Medications  Medication Dose Route Frequency Provider Last Rate Last Admin   0.9 %  sodium chloride infusion  500 mL Intravenous Once Imogene Burn, MD        Allergies as of 12/09/2022   (No Known Allergies)    Family History  Problem Relation Age of Onset   Heart disease Mother    Stroke Mother    Heart disease  Father    Heart disease Maternal Grandmother    Stroke Maternal Grandmother    Heart disease Maternal Grandfather    Kidney disease Paternal Grandmother    Heart disease Paternal Grandfather    Colon cancer Neg Hx    Esophageal cancer Neg Hx    Rectal cancer Neg Hx    Stomach cancer Neg Hx     Social History   Socioeconomic History   Marital status: Legally Separated    Spouse name: Not on file   Number of children: 0   Years of education: Not on file   Highest education level: Not on file  Occupational  History   Not on file  Tobacco Use   Smoking status: Every Day    Current packs/day: 0.50    Average packs/day: 0.5 packs/day for 32.0 years (16.0 ttl pk-yrs)    Types: Cigarettes   Smokeless tobacco: Never  Vaping Use   Vaping status: Never Used  Substance and Sexual Activity   Alcohol use: No   Drug use: No   Sexual activity: Yes    Birth control/protection: Post-menopausal  Other Topics Concern   Not on file  Social History Narrative   Not on file   Social Determinants of Health   Financial Resource Strain: Low Risk  (09/10/2022)   Overall Financial Resource Strain (CARDIA)    Difficulty of Paying Living Expenses: Not hard at all  Food Insecurity: No Food Insecurity (09/10/2022)   Hunger Vital Sign    Worried About Running Out of Food in the Last Year: Never true    Ran Out of Food in the Last Year: Never true  Transportation Needs: Unmet Transportation Needs (09/10/2022)   PRAPARE - Transportation    Lack of Transportation (Medical): Yes    Lack of Transportation (Non-Medical): Yes  Physical Activity: Sufficiently Active (09/10/2022)   Exercise Vital Sign    Days of Exercise per Week: 5 days    Minutes of Exercise per Session: 30 min  Stress: No Stress Concern Present (09/10/2022)   [employer] of Occupational Health - Occupational Stress Questionnaire    Feeling of Stress : Not at all  Social Connections: Unknown (09/10/2022)   Social Connection and  Isolation Panel [NHANES]    Frequency of Communication with Friends and Family: More than three times a week    Frequency of Social Gatherings with Friends and Family: Once a week    Attends Religious Services: 1 to 4 times per year    Active Member of [employer] or Organizations: No    Attends Engineer, structural: More than 4 times per year    Marital Status: Patient declined  Intimate Partner Violence: Not At Risk (05/05/2022)   Humiliation, Afraid, Rape, and Kick questionnaire    Fear of Current or Ex-Partner: No    Emotionally Abused: No    Physically Abused: No    Sexually Abused: No    Physical Exam: Vital signs in last 24 hours: BP 127/68   Pulse 65   Temp (!) 96.8 F (36 C) (Temporal)   Ht 5\' 6"  (1.676 m)   Wt 180 lb (81.6 kg)   SpO2 94%   BMI 29.05 kg/m  GEN: NAD EYE: Sclerae anicteric ENT: MMM CV: Non-tachycardic Pulm: No increased work of breathing GI: Soft, NT/ND NEURO:  Alert & Oriented   Eulah Pont, MD [facility]  12/09/2022 12:15 PM

## 2022-12-09 NOTE — Progress Notes (Signed)
VS completed by EC.   Pt's states no medical or surgical changes since previsit or office visit.  

## 2022-12-09 NOTE — Progress Notes (Signed)
Vss nad trans to pacu 

## 2022-12-10 ENCOUNTER — Telehealth: Payer: Self-pay | Admitting: *Deleted

## 2022-12-10 NOTE — Telephone Encounter (Signed)
  Follow up Call-     12/09/2022   11:25 AM  Call back number  Post procedure Call Back phone  # [phone]  Permission to leave phone message Yes     Patient questions:  Do you have a fever, pain , or abdominal swelling? No. Pain Score  0 *  Have you tolerated food without any problems? Yes.    Have you been able to return to your normal activities? Yes.    Do you have any questions about your discharge instructions: Diet   No. Medications  No. Follow up visit  No.  Do you have questions or concerns about your Care? No.  Actions: * If pain score is 4 or above: No action needed, pain <4.

## 2022-12-13 ENCOUNTER — Encounter: Payer: Self-pay | Admitting: Internal Medicine

## 2022-12-29 ENCOUNTER — Other Ambulatory Visit: Payer: Self-pay | Admitting: Family Medicine

## 2023-01-14 ENCOUNTER — Other Ambulatory Visit: Payer: Self-pay | Admitting: Family Medicine

## 2023-01-14 NOTE — Telephone Encounter (Signed)
Requested Prescriptions  Pending Prescriptions Disp Refills   carvedilol (COREG) 25 MG tablet [Pharmacy Med Name: CARVEDILOL 25 MG Tablet] 180 tablet 0    Sig: TAKE 1 TABLET TWICE DAILY WITH MEALS     Cardiovascular: Beta Blockers 3 Failed - 01/14/2023 12:48 AM      Failed - Valid encounter within last 6 months    Recent Outpatient Visits           2 years ago General medical exam   [facility] Donita Brooks, MD   3 years ago Essential hypertension   [facility] Pickard, Priscille Heidelberg, MD   3 years ago Essential hypertension   [facility] Pickard, Priscille Heidelberg, MD   4 years ago Essential hypertension   [facility] Tanya Nones, Priscille Heidelberg, MD   4 years ago Hypothyroidism, unspecified type   [facility] Donita Brooks, MD       Future Appointments             In 3 weeks Branch, Dorothe Pea, MD [facility] [place] [facility]            Passed - Cr in normal range and within 360 days    Creat  Date Value Ref Range Status  09/08/2022 0.59 0.50 - 1.05 mg/dL Final   Creatinine,U  Date Value Ref Range Status  09/12/2007   Final   28.8 (NOTE)  Cutoff Values for Urine Drug Screen:        Drug Class           Cutoff (ng/mL)        Amphetamines            1000        Barbiturates             200        Cocaine Metabolites      300        Benzodiazepines          200        Methadone                 300        Opiates                 2000        Phencyclidine             25        Propoxyphene             300        Marijuana Metabolites     50  For medical purposes only.         Passed - AST in normal range and within 360 days    AST  Date Value Ref Range Status  09/08/2022 11 10 - 35 U/L Final         Passed - ALT in normal range and within 360 days    ALT  Date Value Ref Range Status  09/08/2022 9 6 - 29 U/L Final         Passed - Last BP in normal range    BP Readings from  Last 1 Encounters:  12/09/22 122/60         Passed - Last Heart Rate in normal range    Pulse Readings from Last 1 Encounters:  12/09/22 61

## 2023-01-17 ENCOUNTER — Other Ambulatory Visit (HOSPITAL_COMMUNITY): Payer: Self-pay | Admitting: Family Medicine

## 2023-01-17 DIAGNOSIS — Z1231 Encounter for screening mammogram for malignant neoplasm of breast: Secondary | ICD-10-CM

## 2023-01-19 ENCOUNTER — Other Ambulatory Visit: Payer: Self-pay | Admitting: Family Medicine

## 2023-01-20 NOTE — Telephone Encounter (Signed)
Last OV 09/14/22 within protocol.  Requested Prescriptions  Pending Prescriptions Disp Refills   levothyroxine (SYNTHROID) 112 MCG tablet [Pharmacy Med Name: LEVOTHYROXINE SODIUM 112 MCG Tablet] 90 tablet 0    Sig: TAKE 1 TABLET EVERY DAY BEFORE BREAKFAST (NEED MD APPOINTMENT)     Endocrinology:  Hypothyroid Agents Failed - 01/19/2023  2:41 AM      Failed - Valid encounter within last 12 months    Recent Outpatient Visits           2 years ago General medical exam   [facility] Donita Brooks, MD   3 years ago Essential hypertension   [facility] Tanya Nones, Priscille Heidelberg, MD   3 years ago Essential hypertension   [facility] Tanya Nones, Priscille Heidelberg, MD   4 years ago Essential hypertension   [facility] Medicine Tanya Nones, Priscille Heidelberg, MD   4 years ago Hypothyroidism, unspecified type   [facility] Pickard, Priscille Heidelberg, MD       Future Appointments             In 2 weeks Branch, Dorothe Pea, MD [facility] [place] [facility]            Passed - TSH in normal range and within 360 days    TSH  Date Value Ref Range Status  09/08/2022 0.91 0.40 - 4.50 mIU/L Final

## 2023-02-08 ENCOUNTER — Ambulatory Visit: Payer: Medicare HMO | Attending: Cardiology | Admitting: Cardiology

## 2023-02-08 ENCOUNTER — Encounter: Payer: Self-pay | Admitting: Cardiology

## 2023-02-08 VITALS — BP 104/58 | HR 52 | Ht 66.0 in | Wt 171.6 lb

## 2023-02-08 DIAGNOSIS — I2510 Atherosclerotic heart disease of native coronary artery without angina pectoris: Secondary | ICD-10-CM | POA: Diagnosis not present

## 2023-02-08 DIAGNOSIS — I739 Peripheral vascular disease, unspecified: Secondary | ICD-10-CM

## 2023-02-08 DIAGNOSIS — I10 Essential (primary) hypertension: Secondary | ICD-10-CM

## 2023-02-08 DIAGNOSIS — E782 Mixed hyperlipidemia: Secondary | ICD-10-CM | POA: Diagnosis not present

## 2023-02-08 NOTE — Patient Instructions (Signed)

## 2023-02-08 NOTE — Progress Notes (Signed)
Clinical Summary Ms. Bezold is a 62 y.o.female seen today for follow up of the following medical problems.     1. HTN -she is compalint with meds      2. PAD - mildly abnormal ABI 02/2013, evidence of focal left SFA disease.  -04/2019 right abi normal, left abi moderate decreased.  - no claudication symptoms. Can have resting symptoms.   No recent claudication.    3. Hyperlipidmeia 11/2021 TC 114 HDL 39 TG 114 LDL 55 08/2022 TC 124 HDL 33 TG 128 LDL 70 - pcp had add zetia, she is on crestor 20mg  daily.    4. Tobacco - 1/2 ppd - has tried patches with some benefit, considering e-cig.    5. CAD - cath 2009 with CTO of RCA and otherwise moderate non-obstructive disease ,     -no recent chest pain, no SOB/DOE.    6. History of systolic dysfuntion - LVEF at that time 25-30% by LV gram in 2009 - LVEF has since normalized to 55-60% by echo 10/2013.    - no LE edema, no SOB/DOE.    7. OSA 03/2014 moderate OSA - wants to consider if she wants to reestsblish with pulmonary   Past Medical History:  Diagnosis Date   Anemia    Arthritis    knees   Blood transfusion 08/2007   CHF (congestive heart failure) (HCC)    Claudication (HCC)    Coronary artery disease    Diabetes mellitus    GERD (gastroesophageal reflux disease)    Hyperlipidemia    Hypertension    Hypothyroidism    PVD (peripheral vascular disease) (HCC)    PVD (peripheral vascular disease) (HCC)    Sleep apnea    does not wear CPAP   Tobacco abuse      No Known Allergies   Current Outpatient Medications  Medication Sig Dispense Refill   ACCU-CHEK AVIVA PLUS test strip USE AS INSTRUCTED 300 strip 3   Accu-Chek Softclix Lancets lancets USE AS INSTRUCTED 300 each 3   Alcohol Swabs (DROPSAFE ALCOHOL PREP) 70 % PADS USE AS DIRECTED 300 each 3   amLODipine (NORVASC) 10 MG tablet TAKE 1 TABLET EVERY DAY 90 tablet 0   AREXVY 120 MCG/0.5ML injection      aspirin EC 325 MG tablet Take 325 mg by  mouth daily.     aspirin EC 81 MG tablet Take 1 tablet (81 mg total) by mouth daily. Swallow whole. (Patient not taking: Reported on 11/24/2022) 90 tablet 3   Blood Glucose Calibration (ACCU-CHEK AVIVA) SOLN USE AS DIRECTED 1 each 3   Blood Glucose Monitoring Suppl (ACCU-CHEK AVIVA PLUS) w/Device KIT Use as directed 1 kit 0   carvedilol (COREG) 25 MG tablet TAKE 1 TABLET TWICE DAILY WITH MEALS 180 tablet 0   ezetimibe (ZETIA) 10 MG tablet Take 1 tablet (10 mg total) by mouth daily. 90 tablet 3   FLUZONE QUADRIVALENT 0.5 ML injection      furosemide (LASIX) 20 MG tablet TAKE 1 TABLET EVERY DAY (NEED MD APPOINTMENT FOR REFILLS) 60 tablet 5   levothyroxine (SYNTHROID) 112 MCG tablet TAKE 1 TABLET EVERY DAY BEFORE BREAKFAST (NEED MD APPOINTMENT) 90 tablet 0   losartan (COZAAR) 100 MG tablet TAKE 1 TABLET EVERY DAY (NEED MD APPOINTMENT FOR REFILLS) 60 tablet 5   metFORMIN (GLUCOPHAGE) 1000 MG tablet TAKE 1 TABLET TWICE DAILY WITH MEALS (NEED MD APPOINTMENT) 180 tablet 3   nitroGLYCERIN (NITROSTAT) 0.4 MG SL tablet Place  1 tablet (0.4 mg total) under the tongue every 5 (five) minutes as needed. (Patient not taking: Reported on 11/24/2022) 100 tablet 3   Omega-3 Fatty Acids (FISH OIL) 1000 MG CAPS Take by mouth.     omeprazole (PRILOSEC) 40 MG capsule TAKE 1 CAPSULE EVERY DAY (NEED MD APPOINTMENT FOR REFILLS) 60 capsule 5   rosuvastatin (CRESTOR) 20 MG tablet TAKE 1 TABLET EVERY DAY 90 tablet 3   No current facility-administered medications for this visit.     Past Surgical History:  Procedure Laterality Date   COLONOSCOPY  08/2011   normal DP at [facility]   COLONOSCOPY WITH PROPOFOL  12/09/2022   CD at [facility]     No Known Allergies    Family History  Problem Relation Age of Onset   Heart disease Mother    Stroke Mother    Heart disease Father    Heart disease Maternal Grandmother    Stroke Maternal Grandmother    Heart disease Maternal Grandfather    Kidney disease Paternal Grandmother     Heart disease Paternal Grandfather    Colon cancer Neg Hx    Esophageal cancer Neg Hx    Rectal cancer Neg Hx    Stomach cancer Neg Hx      Social History Ms. Aaker reports that she has been smoking cigarettes. She has a 16 pack-year smoking history. She has never used smokeless tobacco. Ms. Pepitone reports no history of alcohol use.   Review of Systems CONSTITUTIONAL: No weight loss, fever, chills, weakness or fatigue.  HEENT: Eyes: No visual loss, blurred vision, double vision or yellow sclerae.No hearing loss, sneezing, congestion, runny nose or sore throat.  SKIN: No rash or itching.  CARDIOVASCULAR: per hpi RESPIRATORY: No shortness of breath, cough or sputum.  GASTROINTESTINAL: No anorexia, nausea, vomiting or diarrhea. No abdominal pain or blood.  GENITOURINARY: No burning on urination, no polyuria NEUROLOGICAL: No headache, dizziness, syncope, paralysis, ataxia, numbness or tingling in the extremities. No change in bowel or bladder control.  MUSCULOSKELETAL: No muscle, back pain, joint pain or stiffness.  LYMPHATICS: No enlarged nodes. No history of splenectomy.  PSYCHIATRIC: No history of depression or anxiety.  ENDOCRINOLOGIC: No reports of sweating, cold or heat intolerance. No polyuria or polydipsia.  Marland Kitchen   Physical Examination Today's Vitals   02/08/23 0947  BP: (!) 104/58  Pulse: (!) 52  SpO2: 94%  Weight: 171 lb 9.6 oz (77.8 kg)  Height: 5\' 6"  (1.676 m)   Body mass index is 27.7 kg/m.  Gen: resting comfortably, no acute distress HEENT: no scleral icterus, pupils equal round and reactive, no palptable cervical adenopathy,  CV: RRR, no m/rg, no jvd Resp: Clear to auscultation bilaterally GI: abdomen is soft, non-tender, non-distended, normal bowel sounds, no hepatosplenomegaly MSK: extremities are warm, no edema.  Skin: warm, no rash Neuro:  no focal deficits Psych: appropriate affect   Diagnostic Studies   10/2013 Echo Study Conclusions  - Left  ventricle: The cavity size was normal. There was moderate concentric hypertrophy. Systolic function was normal. The estimated ejection fraction was in the range of 55% to 60%. Wall motion was normal; there were no regional wall motion abnormalities. Left ventricular diastolic function parameters were normal. - Left atrium: The atrium was mildly dilated. - Tricuspid valve: There was trivial regurgitation. - Inferior vena cava: The vessel was normal in size. The respirophasic diameter changes were in the normal range (= 50%), consistent with normal central venous pressure.   02/2013 ABI <50% mid  to distal right SFA RIght ABI 0.92   >50% focal stenosis left SFA Left ABI 0.81   08/2007 Cath HEMODYNAMIC DATA:  1. Right atrial pressure 11.  2. RV 37/13.  3. Pulmonary artery 36/20, mean 28.  4. Pulmonary capillary wedge 19.  5. LV 112/80.  6. Aortic 102/62, mean 78.  7. Superior vena cava saturation 61%.  8. Pulmonary artery saturation 64%.  9. Aortic saturation 96%.  10.Fick cardiac output 5.8 L/min.  11.Fick cardiac index 2.7 L/min/m2.  12.Thermodilution cardiac output through 7.3 L/min.  13.Thermodilution cardiac index 3.45 L/min/m2.  ANGIOGRAPHIC DATA:  1. Ventriculography in the RAO projection reveals a dilated left  ventricle which was poorly contractile. Ejection fraction to be  estimated at 25%-30%. In particular, the inferior wall appears to  be akinetic while everything else was hypokinetic.  2. The left main is free of critical disease.  3. The LAD courses to the apex. There is diffuse calcified disease in  the proximal midportion of the LAD with about 50%-60% narrowing.  There is a segmental smooth plaque in the mid distal LAD of 50%-60%  as well. There is diffuse luminal irregularity throughout the  diagonal and distal LAD system, but not critical stenosis.  4. The circumflex is a very large vessel. There are 2 tiny marginal  branches that are bit bend. In the bend,  there is 50%-60%  narrowing. This was followed by a Averiana Clouatre and another Krysia Zahradnik. A  sub-Sorin Frimpong has 50% narrowing and then a distal sub-Katessa Attridge has about  90% narrowing very peripherally.  5. The right coronary artery is subtotally occluded proximally and  then subtotally occluded again. There is competitive filling of  the mid vessel. There is evidence of collaterals from left to  right.  CONCLUSION:  1. Moderately severe to severe reduction in overall left ventricular  function, probably due to discontinuation of antihypertensive  medications and coronary artery disease.  2. Moderately severe scattered disease of the LAD and circumflex that  does not appear to be critical.  3. Collateralized right coronary artery that supplies what appears to  be probably nearly akinetic segment in the inferior wall.  DISPOSITION: The patient's compliance has been poor. She has stopped  medications on multiple occasions. She is currently hypothyroid, and as  a result we have restarted her Synthroid. Optimally, we will get her  back to euthyroid status at the present time, get her back on her  medications, and eventually follow her up. I think she can be treated  medically for now. At the present time, I am not sure that much would  be gained by percutaneous intervention of the right coronary artery but  I will review this with my colleagues. Most importantly, she will need  to discontinue smoking and take better care of herself, and then this  may be a significant challenge.   Assessment and Plan  1. HTN - at goal, continue current meds   2. PAD - no recent symptoms, continue medical therapy   3. Hyperlipidemia - pcp recently added zetia to her crestor, f/u upcoming labs     4. CAD - no recent symptoms, continue current meds   5. OSA - difficultly tolerating cpap back in 2015 at time of diagnosis - she has not wanted repeat evalaution  F/u 6 months      Antoine Poche, M.D.

## 2023-02-11 ENCOUNTER — Other Ambulatory Visit: Payer: Self-pay | Admitting: Family Medicine

## 2023-02-11 NOTE — Telephone Encounter (Signed)
Requested Prescriptions  Pending Prescriptions Disp Refills   amLODipine (NORVASC) 10 MG tablet [Pharmacy Med Name: amLODIPine Besylate Oral Tablet 10 MG] 90 tablet 1    Sig: TAKE 1 TABLET EVERY DAY     Cardiovascular: Calcium Channel Blockers 2 Failed - 02/11/2023  4:21 AM      Failed - Valid encounter within last 6 months    Recent Outpatient Visits           2 years ago General medical exam   [facility] Donita Brooks, MD   3 years ago Essential hypertension   [facility] Tanya Nones, Priscille Heidelberg, MD   3 years ago Essential hypertension   [facility] Tanya Nones, Priscille Heidelberg, MD   4 years ago Essential hypertension   [facility] Tanya Nones, Priscille Heidelberg, MD   4 years ago Hypothyroidism, unspecified type   [facility] Pickard, Priscille Heidelberg, MD              Passed - Last BP in normal range    BP Readings from Last 1 Encounters:  02/08/23 (!) 104/58         Passed - Last Heart Rate in normal range    Pulse Readings from Last 1 Encounters:  02/08/23 (!) 52          metFORMIN (GLUCOPHAGE) 1000 MG tablet [Pharmacy Med Name: metFORMIN HCl Oral Tablet 1000 MG] 180 tablet 1    Sig: TAKE 1 TABLET TWICE DAILY WITH MEALS     Endocrinology:  Diabetes - Biguanides Failed - 02/11/2023  4:21 AM      Failed - B12 Level in normal range and within 720 days    No results found for: "VITAMINB12"       Failed - Valid encounter within last 6 months    Recent Outpatient Visits           2 years ago General medical exam   [facility] Donita Brooks, MD   3 years ago Essential hypertension   [facility] Tanya Nones, Priscille Heidelberg, MD   3 years ago Essential hypertension   [facility] Tanya Nones, Priscille Heidelberg, MD   4 years ago Essential hypertension   [facility] Tanya Nones, Priscille Heidelberg, MD   4 years ago Hypothyroidism, unspecified type   [facility]  Pickard, Priscille Heidelberg, MD              Passed - Cr in normal range and within 360 days    Creat  Date Value Ref Range Status  09/08/2022 0.59 0.50 - 1.05 mg/dL Final   Creatinine,U  Date Value Ref Range Status  09/12/2007   Final   28.8 (NOTE)  Cutoff Values for Urine Drug Screen:        Drug Class           Cutoff (ng/mL)        Amphetamines            1000        Barbiturates             200        Cocaine Metabolites      300        Benzodiazepines          200        Methadone  300        Opiates                 2000        Phencyclidine             25        Propoxyphene             300        Marijuana Metabolites     50  For medical purposes only.         Passed - HBA1C is between 0 and 7.9 and within 180 days    Hgb A1c MFr Bld  Date Value Ref Range Status  09/08/2022 6.2 (H) <5.7 % of total Hgb Final    Comment:    For someone without known diabetes, a hemoglobin  A1c value between 5.7% and 6.4% is consistent with prediabetes and should be confirmed with a  follow-up test. . For someone with known diabetes, a value <7% indicates that their diabetes is well controlled. A1c targets should be individualized based on duration of diabetes, age, comorbid conditions, and other considerations. . This assay result is consistent with an increased risk of diabetes. . Currently, no consensus exists regarding use of hemoglobin A1c for diagnosis of diabetes for children. .          Passed - eGFR in normal range and within 360 days    GFR, Est African American  Date Value Ref Range Status  09/17/2020 114 > OR = 60 mL/min/1.36m2 Final   GFR, Est Non African American  Date Value Ref Range Status  09/17/2020 98 > OR = 60 mL/min/1.80m2 Final   eGFR  Date Value Ref Range Status  09/08/2022 102 > OR = 60 mL/min/1.32m2 Final         Passed - CBC within normal limits and completed in the last 12 months    WBC  Date Value Ref Range Status  09/08/2022 7.8 3.8  - 10.8 Thousand/uL Final   RBC  Date Value Ref Range Status  09/08/2022 4.05 3.80 - 5.10 Million/uL Final   Hemoglobin  Date Value Ref Range Status  09/08/2022 11.9 11.7 - 15.5 g/dL Final   HCT  Date Value Ref Range Status  09/08/2022 36.5 35.0 - 45.0 % Final   MCHC  Date Value Ref Range Status  09/08/2022 32.6 32.0 - 36.0 g/dL Final   [facility]  Date Value Ref Range Status  09/08/2022 29.4 27.0 - 33.0 pg Final   MCV  Date Value Ref Range Status  09/08/2022 90.1 80.0 - 100.0 fL Final   No results found for: "PLTCOUNTKUC", "LABPLAT", "POCPLA" RDW  Date Value Ref Range Status  09/08/2022 13.8 11.0 - 15.0 % Final

## 2023-03-01 ENCOUNTER — Other Ambulatory Visit: Payer: Self-pay | Admitting: Family Medicine

## 2023-03-02 ENCOUNTER — Other Ambulatory Visit: Payer: Self-pay

## 2023-03-02 NOTE — Telephone Encounter (Signed)
Prescription Request  03/02/2023  LOV: 09/14/22  What is the name of the medication or equipment? ezetimibe (ZETIA) 10 MG tablet [[id number]]  Have you contacted your pharmacy to request a refill? No   Which pharmacy would you like this sent to?  [facility] - [place], [place] - [address] [place] [address] Phone: [phone] Fax: [phone]    Patient notified that their request is being sent to the clinical staff for review and that they should receive a response within 2 business days.   Please advise at [facility] [phone]

## 2023-03-02 NOTE — Telephone Encounter (Signed)
Requested Prescriptions  Pending Prescriptions Disp Refills   nitroGLYCERIN (NITROSTAT) 0.4 MG SL tablet [Pharmacy Med Name: Nitroglycerin Sublingual Tablet Sublingual 0.4 MG] 100 tablet 3    Sig: DISSOLVE 1 TABLET UNDER THE TONGUE EVERY 5 MINUTES AS NEEDED AS DIRECTED     Cardiovascular:  Nitrates Failed - 03/01/2023  8:10 PM      Failed - Valid encounter within last 12 months    Recent Outpatient Visits           2 years ago General medical exam   [facility] Donita Brooks, MD   3 years ago Essential hypertension   [facility] Tanya Nones, Priscille Heidelberg, MD   3 years ago Essential hypertension   [facility] Tanya Nones, Priscille Heidelberg, MD   4 years ago Essential hypertension   [facility] Tanya Nones, Priscille Heidelberg, MD   4 years ago Hypothyroidism, unspecified type   [facility] Pickard, Priscille Heidelberg, MD              Passed - Last BP in normal range    BP Readings from Last 1 Encounters:  02/08/23 (!) 104/58         Passed - Last Heart Rate in normal range    Pulse Readings from Last 1 Encounters:  02/08/23 (!) 52

## 2023-03-03 MED ORDER — EZETIMIBE 10 MG PO TABS
10.0000 mg | ORAL_TABLET | Freq: Every day | ORAL | 1 refills | Status: DC
  Filled 2023-08-13: qty 90, 90d supply, fill #0

## 2023-03-03 NOTE — Telephone Encounter (Signed)
Requested Prescriptions  Pending Prescriptions Disp Refills   ezetimibe (ZETIA) 10 MG tablet 90 tablet 1    Sig: Take 1 tablet (10 mg total) by mouth daily.     Cardiovascular:  Antilipid - Sterol Transport Inhibitors Failed - 03/02/2023  1:11 PM      Failed - Valid encounter within last 12 months    Recent Outpatient Visits           2 years ago General medical exam   [facility] Donita Brooks, MD   3 years ago Essential hypertension   [facility] Tanya Nones, Priscille Heidelberg, MD   3 years ago Essential hypertension   [facility] Tanya Nones, Priscille Heidelberg, MD   4 years ago Essential hypertension   [facility] Tanya Nones, Priscille Heidelberg, MD   4 years ago Hypothyroidism, unspecified type   [facility] Donita Brooks, MD              Failed - Lipid Panel in normal range within the last 12 months    Cholesterol  Date Value Ref Range Status  09/08/2022 124 <200 mg/dL Final   LDL Cholesterol (Calc)  Date Value Ref Range Status  09/08/2022 70 mg/dL (calc) Final    Comment:    Reference range: <100 . Desirable range <100 mg/dL for primary prevention;   <70 mg/dL for patients with CHD or diabetic patients  with > or = 2 CHD risk factors. Marland Kitchen LDL-C is now calculated using the Martin-Hopkins  calculation, which is a validated novel method providing  better accuracy than the Friedewald equation in the  estimation of LDL-C.  Horald Pollen et al. Lenox Ahr. 9147;829(56): 2061-2068  (http://education.[employer].com/faq/FAQ164)    HDL  Date Value Ref Range Status  09/08/2022 33 (L) > OR = 50 mg/dL Final   Triglycerides  Date Value Ref Range Status  09/08/2022 128 <150 mg/dL Final         Passed - AST in normal range and within 360 days    AST  Date Value Ref Range Status  09/08/2022 11 10 - 35 U/L Final         Passed - ALT in normal range and within 360 days    ALT  Date Value Ref Range Status  09/08/2022  9 6 - 29 U/L Final         Passed - Patient is not pregnant

## 2023-03-07 ENCOUNTER — Ambulatory Visit (HOSPITAL_COMMUNITY)
Admission: RE | Admit: 2023-03-07 | Discharge: 2023-03-07 | Disposition: A | Discharge status: Home / Self Care | Payer: Medicare HMO | Source: Ambulatory Visit | Attending: Family Medicine | Admitting: Family Medicine

## 2023-03-07 ENCOUNTER — Encounter (HOSPITAL_COMMUNITY): Payer: Self-pay

## 2023-03-07 DIAGNOSIS — Z1231 Encounter for screening mammogram for malignant neoplasm of breast: Secondary | ICD-10-CM | POA: Insufficient documentation

## 2023-04-01 ENCOUNTER — Other Ambulatory Visit: Payer: Self-pay | Admitting: Family Medicine

## 2023-04-01 NOTE — Telephone Encounter (Signed)
Requested Prescriptions  Pending Prescriptions Disp Refills   carvedilol (COREG) 25 MG tablet [Pharmacy Med Name: Carvedilol Oral Tablet 25 MG] 180 tablet 0    Sig: TAKE 1 TABLET TWICE DAILY WITH MEALS     Cardiovascular: Beta Blockers 3 Failed - 04/01/2023 11:32 AM      Failed - Valid encounter within last 6 months    Recent Outpatient Visits           2 years ago General medical exam   [facility] Donita Brooks, MD   3 years ago Essential hypertension   [facility] Pickard, Priscille Heidelberg, MD   4 years ago Essential hypertension   [facility] Pickard, Priscille Heidelberg, MD   4 years ago Essential hypertension   [facility] Donita Brooks, MD   5 years ago Hypothyroidism, unspecified type   [facility] Pickard, Priscille Heidelberg, MD              Passed - Cr in normal range and within 360 days    Creat  Date Value Ref Range Status  09/08/2022 0.59 0.50 - 1.05 mg/dL Final   Creatinine,U  Date Value Ref Range Status  09/12/2007   Final   28.8 (NOTE)  Cutoff Values for Urine Drug Screen:        Drug Class           Cutoff (ng/mL)        Amphetamines            1000        Barbiturates             200        Cocaine Metabolites      300        Benzodiazepines          200        Methadone                 300        Opiates                 2000        Phencyclidine             25        Propoxyphene             300        Marijuana Metabolites     50  For medical purposes only.         Passed - AST in normal range and within 360 days    AST  Date Value Ref Range Status  09/08/2022 11 10 - 35 U/L Final         Passed - ALT in normal range and within 360 days    ALT  Date Value Ref Range Status  09/08/2022 9 6 - 29 U/L Final         Passed - Last BP in normal range    BP Readings from Last 1 Encounters:  02/08/23 (!) 104/58         Passed - Last Heart Rate in normal range    Pulse Readings from Last  1 Encounters:  02/08/23 (!) 52

## 2023-04-03 ENCOUNTER — Other Ambulatory Visit: Payer: Self-pay | Admitting: Family Medicine

## 2023-04-04 NOTE — Telephone Encounter (Signed)
Requested medication (s) are due for refill today: yes  Requested medication (s) are on the active medication list: yes  Last refill:  01/20/23  Future visit scheduled: no  Notes to clinic:  Unable to refill per protocol, courtesy refill already given, routing for provider approval.      Requested Prescriptions  Pending Prescriptions Disp Refills   levothyroxine (SYNTHROID) 112 MCG tablet [Pharmacy Med Name: Levothyroxine Sodium Oral Tablet 112 MCG] 90 tablet 3    Sig: TAKE 1 TABLET EVERY DAY BEFORE BREAKFAST (NEED MD APPOINTMENT)     Endocrinology:  Hypothyroid Agents Failed - 04/03/2023  7:03 AM      Failed - Valid encounter within last 12 months    Recent Outpatient Visits           2 years ago General medical exam   [facility] Donita Brooks, MD   3 years ago Essential hypertension   [facility] Tanya Nones, Priscille Heidelberg, MD   4 years ago Essential hypertension   [facility] Tanya Nones, Priscille Heidelberg, MD   4 years ago Essential hypertension   [facility] Medicine Tanya Nones, Priscille Heidelberg, MD   5 years ago Hypothyroidism, unspecified type   [facility] Medicine Pickard, Priscille Heidelberg, MD              Passed - TSH in normal range and within 360 days    TSH  Date Value Ref Range Status  09/08/2022 0.91 0.40 - 4.50 mIU/L Final

## 2023-04-13 ENCOUNTER — Other Ambulatory Visit: Payer: Medicare HMO

## 2023-04-13 DIAGNOSIS — I739 Peripheral vascular disease, unspecified: Secondary | ICD-10-CM | POA: Diagnosis not present

## 2023-04-13 DIAGNOSIS — E118 Type 2 diabetes mellitus with unspecified complications: Secondary | ICD-10-CM | POA: Diagnosis not present

## 2023-04-13 DIAGNOSIS — E875 Hyperkalemia: Secondary | ICD-10-CM | POA: Diagnosis not present

## 2023-04-13 DIAGNOSIS — Z13220 Encounter for screening for lipoid disorders: Secondary | ICD-10-CM | POA: Diagnosis not present

## 2023-04-13 DIAGNOSIS — E039 Hypothyroidism, unspecified: Secondary | ICD-10-CM | POA: Diagnosis not present

## 2023-04-13 DIAGNOSIS — I10 Essential (primary) hypertension: Secondary | ICD-10-CM | POA: Diagnosis not present

## 2023-04-14 LAB — CBC WITH DIFFERENTIAL/PLATELET
Absolute Lymphocytes: 2034 {cells}/uL (ref 850–3900)
Absolute Monocytes: 468 {cells}/uL (ref 200–950)
Basophils Absolute: 72 {cells}/uL (ref 0–200)
Basophils Relative: 0.8 %
Eosinophils Absolute: 108 {cells}/uL (ref 15–500)
Eosinophils Relative: 1.2 %
HCT: 36.8 % (ref 35.0–45.0)
Hemoglobin: 12 g/dL (ref 11.7–15.5)
MCH: 30.7 pg (ref 27.0–33.0)
MCHC: 32.6 g/dL (ref 32.0–36.0)
MCV: 94.1 fL (ref 80.0–100.0)
MPV: 10.1 fL (ref 7.5–12.5)
Monocytes Relative: 5.2 %
Neutro Abs: 6318 {cells}/uL (ref 1500–7800)
Neutrophils Relative %: 70.2 %
Platelets: 317 10*3/uL (ref 140–400)
RBC: 3.91 10*6/uL (ref 3.80–5.10)
RDW: 14.6 % (ref 11.0–15.0)
Total Lymphocyte: 22.6 %
WBC: 9 10*3/uL (ref 3.8–10.8)

## 2023-04-14 LAB — COMPLETE METABOLIC PANEL WITH GFR
AG Ratio: 1.6 (calc) (ref 1.0–2.5)
ALT: 11 U/L (ref 6–29)
AST: 15 U/L (ref 10–35)
Albumin: 4.4 g/dL (ref 3.6–5.1)
Alkaline phosphatase (APISO): 80 U/L (ref 37–153)
BUN: 17 mg/dL (ref 7–25)
CO2: 27 mmol/L (ref 20–32)
Calcium: 9.8 mg/dL (ref 8.6–10.4)
Chloride: 99 mmol/L (ref 98–110)
Creat: 0.6 mg/dL (ref 0.50–1.05)
Globulin: 2.7 g/dL (ref 1.9–3.7)
Glucose, Bld: 98 mg/dL (ref 65–99)
Potassium: 4.6 mmol/L (ref 3.5–5.3)
Sodium: 137 mmol/L (ref 135–146)
Total Bilirubin: 0.4 mg/dL (ref 0.2–1.2)
Total Protein: 7.1 g/dL (ref 6.1–8.1)
eGFR: 101 mL/min/{1.73_m2} (ref >=60)

## 2023-04-14 LAB — LIPID PANEL
Cholesterol: 98 mg/dL (ref <200)
HDL: 36 mg/dL — ABNORMAL LOW (ref >=50)
LDL Cholesterol (Calc): 43 mg/dL
Non-HDL Cholesterol (Calc): 62 mg/dL (ref <130)
Total CHOL/HDL Ratio: 2.7 (calc) (ref <5.0)
Triglycerides: 108 mg/dL (ref <150)

## 2023-04-14 LAB — VITAMIN B12: Vitamin B-12: 138 pg/mL — ABNORMAL LOW (ref 200–1100)

## 2023-04-14 LAB — HEMOGLOBIN A1C
Hgb A1c MFr Bld: 5.8 %{Hb} — ABNORMAL HIGH (ref <5.7)
Mean Plasma Glucose: 120 mg/dL
eAG (mmol/L): 6.6 mmol/L

## 2023-04-14 LAB — MICROALBUMIN / CREATININE URINE RATIO
Creatinine, Urine: 5 mg/dL — ABNORMAL LOW (ref 20–275)
Microalb Creat Ratio: 40 mg/g{creat} — ABNORMAL HIGH (ref <30)
Microalb, Ur: 0.2 mg/dL

## 2023-04-14 LAB — TSH: TSH: 0.12 m[IU]/L — ABNORMAL LOW (ref 0.40–4.50)

## 2023-04-18 ENCOUNTER — Telehealth: Payer: Self-pay

## 2023-04-18 NOTE — Telephone Encounter (Signed)
Copied from CRM [id number]. Topic: Appointments - Appointment Info/Confirmation >> Apr 18, 2023  9:25 AM [name] wrote: Patient/patient representative is calling for information regarding an appointment. Patient would like to know if she's able to have her eye exam tomorrow during her 10 am appointment with Dr.Pickard. She would like to speak to [facility] if possible.

## 2023-04-18 NOTE — Telephone Encounter (Signed)
Copied from CRM [id number]. Topic: General - Call Back - No Documentation >> Apr 15, 2023  4:26 PM Mosetta Putt H wrote: Reason for CRM: needs to speak with shannon about eye exam

## 2023-04-19 ENCOUNTER — Encounter: Payer: Self-pay | Admitting: Family Medicine

## 2023-04-19 ENCOUNTER — Ambulatory Visit: Payer: Medicare HMO | Admitting: Family Medicine

## 2023-04-19 VITALS — BP 124/66 | HR 53 | Temp 98.1°F | Ht 66.0 in | Wt 175.5 lb

## 2023-04-19 DIAGNOSIS — I10 Essential (primary) hypertension: Secondary | ICD-10-CM | POA: Diagnosis not present

## 2023-04-19 DIAGNOSIS — R0689 Other abnormalities of breathing: Secondary | ICD-10-CM

## 2023-04-19 DIAGNOSIS — E118 Type 2 diabetes mellitus with unspecified complications: Secondary | ICD-10-CM

## 2023-04-19 DIAGNOSIS — Z7984 Long term (current) use of oral hypoglycemic drugs: Secondary | ICD-10-CM | POA: Diagnosis not present

## 2023-04-19 DIAGNOSIS — E538 Deficiency of other specified B group vitamins: Secondary | ICD-10-CM

## 2023-04-19 DIAGNOSIS — E039 Hypothyroidism, unspecified: Secondary | ICD-10-CM

## 2023-04-19 LAB — HM DIABETES EYE EXAM

## 2023-04-19 MED ORDER — LEVOTHYROXINE SODIUM 100 MCG PO TABS
100.0000 ug | ORAL_TABLET | Freq: Every day | ORAL | 3 refills | Status: DC
  Filled 2023-07-08: qty 90, 90d supply, fill #0
  Filled 2023-10-03: qty 90, 90d supply, fill #1
  Filled 2023-12-28: qty 90, 90d supply, fill #2

## 2023-04-19 NOTE — Progress Notes (Signed)
Subjective:    Patient ID: [name], female    DOB: [birth year], 62 y.o.   MRN: [id number]  Patient is a very pleasant 62 year old Caucasian female who presents today for follow-up.  She has a history of congestive heart failure as well as peripheral vascular disease with abnormal ABI in the left leg in 2020.  She denies claudication.  She denies chest pain.  She denies shortness of breath or dyspnea on exertion.  Unfortunately she continues to smoke.  She has no desire to quit smoking at the present time.  Her most recent lab work is listed below.  Her A1c is outstanding at 5.8.  Her renal function normal.  Her LDL cholesterol below 55.  However her TSH is low.  She is currently on levothyroxine 112 mcg a day.  Her vitamin B12 is also very low.  She denies any neuropathy in her feet.  She denies any memory loss.  Her heart rate is slightly low at 53 bpm.  She is on carvedilol but she denies any lightheadedness or dizziness. Lab on 04/13/2023  Component Date Value Ref Range Status   WBC 04/13/2023 9.0  3.8 - 10.8 Thousand/uL Final   RBC 04/13/2023 3.91  3.80 - 5.10 Million/uL Final   Hemoglobin 04/13/2023 12.0  11.7 - 15.5 g/dL Final   HCT 82/95/6213 36.8  35.0 - 45.0 % Final   MCV 04/13/2023 94.1  80.0 - 100.0 fL Final   MCH 04/13/2023 30.7  27.0 - 33.0 pg Final   MCHC 04/13/2023 32.6  32.0 - 36.0 g/dL Final   Comment: For adults, a slight decrease in the calculated MCHC value (in the range of 30 to 32 g/dL) is most likely not clinically significant; however, it should be interpreted with caution in correlation with other red cell parameters and the patient's clinical condition.    RDW 04/13/2023 14.6  11.0 - 15.0 % Final   Platelets 04/13/2023 317  140 - 400 Thousand/uL Final   MPV 04/13/2023 10.1  7.5 - 12.5 fL Final   Neutro Abs 04/13/2023 6,318  1,500 - 7,800 cells/uL Final   Absolute Lymphocytes 04/13/2023 2,034  850 - 3,900 cells/uL Final   Absolute Monocytes 04/13/2023 468   200 - 950 cells/uL Final   Eosinophils Absolute 04/13/2023 108  15 - 500 cells/uL Final   Basophils Absolute 04/13/2023 72  0 - 200 cells/uL Final   Neutrophils Relative % 04/13/2023 70.2  % Final   Total Lymphocyte 04/13/2023 22.6  % Final   Monocytes Relative 04/13/2023 5.2  % Final   Eosinophils Relative 04/13/2023 1.2  % Final   Basophils Relative 04/13/2023 0.8  % Final   Glucose, Bld 04/13/2023 98  65 - 99 mg/dL Final   Comment: .            Fasting reference interval .    BUN 04/13/2023 17  7 - 25 mg/dL Final   Creat 08/65/7846 0.60  0.50 - 1.05 mg/dL Final   eGFR 96/29/5284 101  > OR = 60 mL/min/1.66m2 Final   BUN/Creatinine Ratio 04/13/2023 SEE NOTE:  6 - 22 (calc) Final   Comment:    Not Reported: BUN and Creatinine are within    reference range. .    Sodium 04/13/2023 137  135 - 146 mmol/L Final   Potassium 04/13/2023 4.6  3.5 - 5.3 mmol/L Final   Chloride 04/13/2023 99  98 - 110 mmol/L Final   CO2 04/13/2023 27  20 - 32 mmol/L  Final   Calcium 04/13/2023 9.8  8.6 - 10.4 mg/dL Final   Total Protein 16/03/9603 7.1  6.1 - 8.1 g/dL Final   Albumin 54/01/8118 4.4  3.6 - 5.1 g/dL Final   Globulin 14/78/2956 2.7  1.9 - 3.7 g/dL (calc) Final   AG Ratio 04/13/2023 1.6  1.0 - 2.5 (calc) Final   Total Bilirubin 04/13/2023 0.4  0.2 - 1.2 mg/dL Final   Alkaline phosphatase (APISO) 04/13/2023 80  37 - 153 U/L Final   AST 04/13/2023 15  10 - 35 U/L Final   ALT 04/13/2023 11  6 - 29 U/L Final   Cholesterol 04/13/2023 98  <200 mg/dL Final   HDL 21/30/8657 36 (L)  > OR = 50 mg/dL Final   Triglycerides 84/69/6295 108  <150 mg/dL Final   LDL Cholesterol (Calc) 04/13/2023 43  mg/dL (calc) Final   Comment: Reference range: <100 . Desirable range <100 mg/dL for primary prevention;   <70 mg/dL for patients with CHD or diabetic patients  with > or = 2 CHD risk factors. Marland Kitchen LDL-C is now calculated using the Martin-Hopkins  calculation, which is a validated novel method providing  better  accuracy than the Friedewald equation in the  estimation of LDL-C.  Horald Pollen et al. Lenox Ahr. 2841;324(40): 2061-2068  (http://education.[facility].com/faq/FAQ164)    Total CHOL/HDL Ratio 04/13/2023 2.7  <1.0 (calc) Final   Non-HDL Cholesterol (Calc) 04/13/2023 62  <130 mg/dL (calc) Final   Comment: For patients with diabetes plus 1 major ASCVD risk  factor, treating to a non-HDL-C goal of <100 mg/dL  (LDL-C of <27 mg/dL) is considered a therapeutic  option.    TSH 04/13/2023 0.12 (L)  0.40 - 4.50 mIU/L Final   Creatinine, Urine 04/13/2023 5 (L)  20 - 275 mg/dL Final   Microalb, Ur 25/36/6440 0.2  mg/dL Final   Comment: Reference Range Not established    Microalb Creat Ratio 04/13/2023 40 (H)  <30 mg/g creat Final   Comment: . The ADA defines abnormalities in albumin excretion as follows: Marland Kitchen Albuminuria Category        Result (mg/g creatinine) . Normal to Mildly increased   <30 Moderately increased         30-299  Severely increased           > OR = 300 . The ADA recommends that at least two of three specimens collected within a 3-6 month period be abnormal before considering a patient to be within a diagnostic category.    Vitamin B-12 04/13/2023 138 (L)  200 - 1,100 pg/mL Final   Hgb A1c MFr Bld 04/13/2023 5.8 (H)  <5.7 % of total Hgb Final   Comment: For someone without known diabetes, a hemoglobin  A1c value between 5.7% and 6.4% is consistent with prediabetes and should be confirmed with a  follow-up test. . For someone with known diabetes, a value <7% indicates that their diabetes is well controlled. A1c targets should be individualized based on duration of diabetes, age, comorbid conditions, and other considerations. . This assay result is consistent with an increased risk of diabetes. . Currently, no consensus exists regarding use of hemoglobin A1c for diagnosis of diabetes for children. .    Mean Plasma Glucose 04/13/2023 120  mg/dL Final   eAG  (mmol/L) 34/74/2595 6.6  mmol/L Final    Past Medical History:  Diagnosis Date   Anemia    Arthritis    knees   Blood transfusion 08/2007   CHF (congestive heart failure) (  HCC)    Claudication (HCC)    Coronary artery disease    Diabetes mellitus    GERD (gastroesophageal reflux disease)    Hyperlipidemia    Hypertension    Hypothyroidism    PVD (peripheral vascular disease) (HCC)    PVD (peripheral vascular disease) (HCC)    Sleep apnea    does not wear CPAP   Tobacco abuse    Past Surgical History:  Procedure Laterality Date   COLONOSCOPY  08/2011   normal DP at [facility]   COLONOSCOPY WITH PROPOFOL  12/09/2022   CD at [facility]   Current Outpatient Medications on File Prior to Visit  Medication Sig Dispense Refill   ACCU-CHEK AVIVA PLUS test strip USE AS INSTRUCTED 300 strip 3   Accu-Chek Softclix Lancets lancets USE AS INSTRUCTED 300 each 3   Alcohol Swabs (DROPSAFE ALCOHOL PREP) 70 % PADS USE AS DIRECTED 300 each 3   amLODipine (NORVASC) 10 MG tablet TAKE 1 TABLET EVERY DAY 90 tablet 1   aspirin EC 325 MG tablet Take 325 mg by mouth daily.     Blood Glucose Calibration (ACCU-CHEK AVIVA) SOLN USE AS DIRECTED 1 each 3   Blood Glucose Monitoring Suppl (ACCU-CHEK AVIVA PLUS) w/Device KIT Use as directed 1 kit 0   carvedilol (COREG) 25 MG tablet TAKE 1 TABLET TWICE DAILY WITH MEALS 180 tablet 0   ezetimibe (ZETIA) 10 MG tablet Take 1 tablet (10 mg total) by mouth daily. 90 tablet 1   furosemide (LASIX) 20 MG tablet TAKE 1 TABLET EVERY DAY (NEED MD APPOINTMENT FOR REFILLS) 60 tablet 5   losartan (COZAAR) 100 MG tablet TAKE 1 TABLET EVERY DAY (NEED MD APPOINTMENT FOR REFILLS) 60 tablet 5   metFORMIN (GLUCOPHAGE) 1000 MG tablet TAKE 1 TABLET TWICE DAILY WITH MEALS 180 tablet 1   nitroGLYCERIN (NITROSTAT) 0.4 MG SL tablet Place 0.4 mg under the tongue every 5 (five) minutes x 3 doses as needed for chest pain (if no relief after 3rd dose, proceed to ED or call 911).     nitroGLYCERIN  (NITROSTAT) 0.4 MG SL tablet DISSOLVE 1 TABLET UNDER THE TONGUE EVERY 5 MINUTES AS NEEDED AS DIRECTED 100 tablet 3   Omega-3 Fatty Acids (FISH OIL) 1000 MG CAPS Take by mouth.     omeprazole (PRILOSEC) 40 MG capsule TAKE 1 CAPSULE EVERY DAY (NEED MD APPOINTMENT FOR REFILLS) 60 capsule 5   rosuvastatin (CRESTOR) 20 MG tablet TAKE 1 TABLET EVERY DAY 90 tablet 3   aspirin EC 81 MG tablet Take 1 tablet (81 mg total) by mouth daily. Swallow whole. (Patient not taking: Reported on 04/19/2023) 90 tablet 3   No current facility-administered medications on file prior to visit.   No Known Allergies Social History   Socioeconomic History   Marital status: Legally Separated    Spouse name: Not on file   Number of children: 0   Years of education: Not on file   Highest education level: Not on file  Occupational History   Not on file  Tobacco Use   Smoking status: Every Day    Current packs/day: 0.50    Average packs/day: 0.5 packs/day for 32.0 years (16.0 ttl pk-yrs)    Types: Cigarettes   Smokeless tobacco: Never  Vaping Use   Vaping status: Never Used  Substance and Sexual Activity   Alcohol use: No   Drug use: No   Sexual activity: Yes    Birth control/protection: Post-menopausal  Other Topics Concern   Not on  file  Social History Narrative   Not on file   Social Determinants of Health   Financial Resource Strain: Low Risk  (09/10/2022)   Overall Financial Resource Strain (CARDIA)    Difficulty of Paying Living Expenses: Not hard at all  Food Insecurity: No Food Insecurity (09/10/2022)   Hunger Vital Sign    Worried About Running Out of Food in the Last Year: Never true    Ran Out of Food in the Last Year: Never true  Transportation Needs: Unmet Transportation Needs (09/10/2022)   PRAPARE - Administrator, Civil Service (Medical): Yes    Lack of Transportation (Non-Medical): Yes  Physical Activity: Sufficiently Active (09/10/2022)   Exercise Vital Sign    Days of  Exercise per Week: 5 days    Minutes of Exercise per Session: 30 min  Stress: No Stress Concern Present (09/10/2022)   [employer] of Occupational Health - Occupational Stress Questionnaire    Feeling of Stress : Not at all  Social Connections: Unknown (09/10/2022)   Social Connection and Isolation Panel [NHANES]    Frequency of Communication with Friends and Family: More than three times a week    Frequency of Social Gatherings with Friends and Family: Once a week    Attends Religious Services: 1 to 4 times per year    Active Member of [employer] or Organizations: No    Attends Engineer, structural: More than 4 times per year    Marital Status: Patient declined  Intimate Partner Violence: Not At Risk (05/05/2022)   Humiliation, Afraid, Rape, and Kick questionnaire    Fear of Current or Ex-Partner: No    Emotionally Abused: No    Physically Abused: No    Sexually Abused: No     Review of Systems  All other systems reviewed and are negative.      Objective:   Physical Exam Vitals reviewed.  Constitutional:      General: She is not in acute distress.    Appearance: She is well-developed. She is not diaphoretic.  HENT:     Mouth/Throat:     Pharynx: No oropharyngeal exudate.  Eyes:     General: No scleral icterus.    Conjunctiva/sclera: Conjunctivae normal.  Neck:     Thyroid: No thyromegaly.     Vascular: No JVD.  Cardiovascular:     Rate and Rhythm: Normal rate and regular rhythm.     Heart sounds: Murmur heard.     No friction rub. No gallop.  Pulmonary:     Effort: Pulmonary effort is normal. No respiratory distress.     Breath sounds: Rales present. No wheezing.  Abdominal:     General: Bowel sounds are normal. There is no distension.     Palpations: Abdomen is soft.     Tenderness: There is no abdominal tenderness. There is no guarding or rebound.  Musculoskeletal:     Cervical back: Neck supple.  Lymphadenopathy:     Cervical: No cervical adenopathy.      Assessment & Plan:  Controlled type 2 diabetes mellitus with complication, without long-term current use of insulin (HCC)  Hypothyroidism, unspecified type  Essential hypertension  Abnormal breath sounds  B12 deficiency  Decrease levothyroxine to 100 mcg a day and recheck TSH in 2 months.  Start B12 1000 mcg daily and recheck a vitamin B12 level in 3 months.  Blood pressure today is excellent.  If she starts complaining of dizziness I will reduce the dose of her  carvedilol.  A1c is outstanding.  LDL cholesterol is less than 55.  Continue to encourage the patient to quit smoking.  Patient has Rales in her lungs but chest x-ray obtained at the last visit showed atelectasis as the explanation for this.  Diabetic eye exam was performed today.

## 2023-05-08 ENCOUNTER — Other Ambulatory Visit: Payer: Self-pay | Admitting: Family Medicine

## 2023-07-05 ENCOUNTER — Other Ambulatory Visit (HOSPITAL_COMMUNITY): Payer: Self-pay

## 2023-07-08 ENCOUNTER — Other Ambulatory Visit: Payer: Self-pay

## 2023-07-08 ENCOUNTER — Other Ambulatory Visit (HOSPITAL_COMMUNITY): Payer: Self-pay

## 2023-08-01 ENCOUNTER — Other Ambulatory Visit: Payer: Self-pay

## 2023-08-01 ENCOUNTER — Other Ambulatory Visit (HOSPITAL_COMMUNITY): Payer: Self-pay

## 2023-08-01 MED FILL — Nitroglycerin SL Tab 0.4 MG: SUBLINGUAL | 30 days supply | Qty: 100 | Fill #0 | Status: AC

## 2023-08-13 ENCOUNTER — Other Ambulatory Visit (HOSPITAL_BASED_OUTPATIENT_CLINIC_OR_DEPARTMENT_OTHER): Payer: Self-pay

## 2023-08-13 ENCOUNTER — Other Ambulatory Visit: Payer: Self-pay | Admitting: Family Medicine

## 2023-08-13 ENCOUNTER — Other Ambulatory Visit (HOSPITAL_COMMUNITY): Payer: Self-pay

## 2023-08-13 MED FILL — Rosuvastatin Calcium Tab 20 MG: ORAL | 90 days supply | Qty: 90 | Fill #0 | Status: CN

## 2023-08-15 ENCOUNTER — Other Ambulatory Visit: Payer: Self-pay

## 2023-08-15 ENCOUNTER — Other Ambulatory Visit (HOSPITAL_COMMUNITY): Payer: Self-pay

## 2023-08-15 MED ORDER — CARVEDILOL 25 MG PO TABS
25.0000 mg | ORAL_TABLET | Freq: Two times a day (BID) | ORAL | 0 refills | Status: DC
  Filled 2023-08-15: qty 180, 90d supply, fill #0

## 2023-08-15 MED FILL — Rosuvastatin Calcium Tab 20 MG: ORAL | 90 days supply | Qty: 90 | Fill #0 | Status: AC

## 2023-09-13 ENCOUNTER — Ambulatory Visit

## 2023-09-13 NOTE — Progress Notes (Deleted)
 Subjective:   [name] is a 63 y.o. female who presents for Medicare Annual (Subsequent) preventive examination.  Visit Complete: {VISITMETHODVS:[id number]}  Patient Medicare AWV questionnaire was completed by the patient on ***; I have confirmed that all information answered by patient is correct and no changes since this date.        Objective:    Today's Vitals   09/13/23 0937  BP: 124/66  Weight: 175 lb (79.4 kg)  Height: 5\' 6"  (1.676 m)   Body mass index is 28.25 kg/m.     05/05/2022    2:09 PM 04/30/2021   11:59 AM 09/26/2020    9:06 AM 01/04/2018    9:17 AM  Advanced Directives  Does Patient Have a Medical Advance Directive? No No No No  Would patient like information on creating a medical advance directive? No - Patient declined No - Patient declined No - Patient declined Yes (ED - Information included in AVS)    Current Medications (verified) Outpatient Encounter Medications as of 09/13/2023  Medication Sig   Accu-Chek Softclix Lancets lancets USE AS INSTRUCTED TO TEST BLOOD SUGAR   Alcohol Swabs (DROPSAFE ALCOHOL PREP) 70 % PADS USE AS DIRECTED   amLODipine (NORVASC) 10 MG tablet TAKE 1 TABLET EVERY DAY   aspirin EC 325 MG tablet Take 325 mg by mouth daily.   aspirin EC 81 MG tablet Take 1 tablet (81 mg total) by mouth daily. Swallow whole. (Patient not taking: Reported on 04/19/2023)   Blood Glucose Calibration (ACCU-CHEK AVIVA) SOLN USE AS DIRECTED   Blood Glucose Monitoring Suppl (ACCU-CHEK AVIVA PLUS) w/Device KIT Use as directed   carvedilol (COREG) 25 MG tablet Take 1 tablet (25 mg total) by mouth 2 (two) times daily with a meal.   ezetimibe (ZETIA) 10 MG tablet Take 1 tablet (10 mg total) by mouth daily.   furosemide (LASIX) 20 MG tablet TAKE 1 TABLET EVERY DAY (NEED MD APPOINTMENT FOR REFILLS)   glucose blood (ACCU-CHEK AVIVA PLUS) test strip USE AS INSTRUCTED TO CHECK BLOOD SUGAR   levothyroxine (SYNTHROID) 100 MCG tablet Take 1 tablet (100 mcg  total) by mouth daily.   losartan (COZAAR) 100 MG tablet TAKE 1 TABLET EVERY DAY (NEED MD APPOINTMENT FOR REFILLS)   metFORMIN (GLUCOPHAGE) 1000 MG tablet TAKE 1 TABLET TWICE DAILY WITH MEALS   nitroGLYCERIN (NITROSTAT) 0.4 MG SL tablet Place 0.4 mg under the tongue every 5 (five) minutes x 3 doses as needed for chest pain (if no relief after 3rd dose, proceed to ED or call 911).   nitroGLYCERIN (NITROSTAT) 0.4 MG SL tablet DISSOLVE 1 TABLET UNDER THE TONGUE EVERY 5 MINUTES AS NEEDED AS DIRECTED   Omega-3 Fatty Acids (FISH OIL) 1000 MG CAPS Take by mouth.   omeprazole (PRILOSEC) 40 MG capsule TAKE 1 CAPSULE EVERY DAY (NEED MD APPOINTMENT FOR REFILLS)   rosuvastatin (CRESTOR) 20 MG tablet Take 1 tablet (20 mg total) by mouth daily.   No facility-administered encounter medications on file as of 09/13/2023.    Allergies (verified) Patient has no known allergies.   History: Past Medical History:  Diagnosis Date   Anemia    Arthritis    knees   Blood transfusion 08/2007   CHF (congestive heart failure) (HCC)    Claudication (HCC)    Coronary artery disease    Diabetes mellitus    GERD (gastroesophageal reflux disease)    Hyperlipidemia    Hypertension    Hypothyroidism    PVD (peripheral vascular disease) (HCC)  PVD (peripheral vascular disease) (HCC)    Sleep apnea    does not wear CPAP   Tobacco abuse    Past Surgical History:  Procedure Laterality Date   COLONOSCOPY  08/2011   normal DP at [facility]   COLONOSCOPY WITH PROPOFOL  12/09/2022   CD at [facility]   Family History  Problem Relation Age of Onset   Heart disease Mother    Stroke Mother    Heart disease Father    Heart disease Maternal Grandmother    Stroke Maternal Grandmother    Heart disease Maternal Grandfather    Kidney disease Paternal Grandmother    Heart disease Paternal Grandfather    Colon cancer Neg Hx    Esophageal cancer Neg Hx    Rectal cancer Neg Hx    Stomach cancer Neg Hx    Social History    Socioeconomic History   Marital status: Legally Separated    Spouse name: Not on file   Number of children: 0   Years of education: Not on file   Highest education level: Not on file  Occupational History   Not on file  Tobacco Use   Smoking status: Every Day    Current packs/day: 0.50    Average packs/day: 0.5 packs/day for 32.0 years (16.0 ttl pk-yrs)    Types: Cigarettes   Smokeless tobacco: Never  Vaping Use   Vaping status: Never Used  Substance and Sexual Activity   Alcohol use: No   Drug use: No   Sexual activity: Yes    Birth control/protection: Post-menopausal  Other Topics Concern   Not on file  Social History Narrative   Not on file   Social Drivers of Health   Financial Resource Strain: Low Risk  (09/09/2023)   Overall Financial Resource Strain (CARDIA)    Difficulty of Paying Living Expenses: Not very hard  Food Insecurity: Patient Declined (09/09/2023)   Hunger Vital Sign    Worried About Running Out of Food in the Last Year: Patient declined    Ran Out of Food in the Last Year: Patient declined  Transportation Needs: No Transportation Needs (09/09/2023)   PRAPARE - Administrator, Civil Service (Medical): No    Lack of Transportation (Non-Medical): No  Physical Activity: Insufficiently Active (09/09/2023)   Exercise Vital Sign    Days of Exercise per Week: 3 days    Minutes of Exercise per Session: 20 min  Stress: No Stress Concern Present (09/09/2023)   [employer] of Occupational Health - Occupational Stress Questionnaire    Feeling of Stress : Only a little  Social Connections: Unknown (09/09/2023)   Social Connection and Isolation Panel [NHANES]    Frequency of Communication with Friends and Family: Three times a week    Frequency of Social Gatherings with Friends and Family: Patient declined    Attends Religious Services: Patient declined    Database administrator or Organizations: Patient declined    Attends Hospital doctor: Not on file    Marital Status: Patient declined    Tobacco Counseling Ready to quit: Not Answered Counseling given: Not Answered   Clinical Intake:                        Activities of Daily Living    04/14/2023    9:59 AM  In your present state of health, do you have any difficulty performing the following activities:  Hearing? 0  Vision? 0  Difficulty concentrating or making decisions? 0  Walking or climbing stairs? 0  Dressing or bathing? 0  Doing errands, shopping? 0  Preparing Food and eating ? N  Using the Toilet? N  In the past six months, have you accidently leaked urine? N  Do you have problems with loss of bowel control? N  Managing your Medications? N  Managing your Finances? N  Housekeeping or managing your Housekeeping? N    Patient Care Team: Austine Lefort, MD as PCP - General (Family Medicine) Amanda Jungling Joyceann No, MD as PCP - Cardiology (Cardiology)  Indicate any recent Medical Services you may have received from other than [facility] providers in the past year (date may be approximate).     Assessment:   This is a routine wellness examination for [facility].  Hearing/Vision screen No results found.   Goals Addressed             This Visit's Progress    Activity and Exercise Increased       Evidence-based guidance:  Review current exercise levels.  Assess patient perspective on exercise or activity level, barriers to increasing activity, motivation and readiness for change.  Recommend or set healthy exercise goal based on individual tolerance.  Encourage small steps toward making change in amount of exercise or activity.  Urge reduction of sedentary activities or screen time.  Promote group activities within the community or with family or support person.  Consider referral to rehabiliation therapist for assessment and exercise/activity plan.   Notes:       Depression Screen    04/19/2023    9:44 AM 09/14/2022    8:55  AM 05/05/2022    2:07 PM 04/30/2021   11:56 AM 09/26/2020    9:05 AM 01/04/2018    8:54 AM 01/04/2018    8:26 AM  PHQ 2/9 Scores  PHQ - 2 Score 0 0 0 0 0 0 0    Fall Risk    04/19/2023    9:44 AM 04/14/2023    9:59 AM 09/14/2022    8:55 AM 05/05/2022    2:11 PM 04/30/2021   11:59 AM  Fall Risk   Falls in the past year? 0 0 0 0 0  Number falls in past yr: 0  0 0 0  Injury with Fall? 0  0 0 0  Risk for fall due to :   No Fall Risks No Fall Risks Impaired balance/gait;Orthopedic patient  Follow up   Falls prevention discussed Falls prevention discussed Falls prevention discussed    MEDICARE RISK AT HOME:    TIMED UP AND GO:  Was the test performed?  No    Cognitive Function:        05/05/2022    2:12 PM 04/30/2021   12:02 PM  6CIT Screen  What Year? 0 points 0 points  What month? 0 points 0 points  What time? 0 points 0 points  Count back from 20 0 points 0 points  Months in reverse 0 points 0 points  Repeat phrase 0 points 2 points  Total Score 0 points 2 points    Immunizations Immunization History  Administered Date(s) Administered   H1N1 03/29/2008   Influenza, High Dose Seasonal PF 03/07/2018   Influenza,inj,Quad PF,6+ Mos 02/20/2013, 05/09/2014, 03/04/2015, 03/26/2016, 03/03/2017, 03/08/2019   Influenza-Unspecified 04/16/2010, 02/02/2011, 02/22/2012, 03/07/2018, 03/11/2021, 02/17/2022, 02/01/2023   Moderna Covid-19 Fall Seasonal Vaccine 79yrs & older 02/01/2023   PFIZER(Purple Top)SARS-COV-2 Vaccination 10/11/2019, 11/02/2019, 05/19/2020   Pfizer(Comirnaty)Fall Seasonal Vaccine 12  years and older 02/25/2022   Pneumococcal Polysaccharide-23 08/30/2007, 09/05/2017   Respiratory Syncytial Virus Vaccine,Recomb Aduvanted(Arexvy) 02/17/2022   Td 05/03/2011   Tdap 05/03/2011, 09/26/2020   Zoster Recombinant(Shingrix) 02/12/2020, 01/12/2022    TDAP status: Up to date  Flu Vaccine status: Up to date  Pneumococcal vaccine status: Due, Education has been provided  regarding the importance of this vaccine. Advised may receive this vaccine at local pharmacy or Health Dept. Aware to provide a copy of the vaccination record if obtained from local pharmacy or Health Dept. Verbalized acceptance and understanding.  Covid-19 vaccine status: Information provided on how to obtain vaccines.   Qualifies for Shingles Vaccine? Yes   Zostavax completed Yes   Shingrix Completed?: Yes  Screening Tests Health Maintenance  Topic Date Due   Pneumococcal Vaccine 56-83 Years old (2 of 2 - PCV) 09/06/2018   FOOT EXAM  01/03/2024 (Originally 03/07/2020)   HEMOGLOBIN A1C  10/11/2023   INFLUENZA VACCINE  12/30/2023   Diabetic kidney evaluation - eGFR measurement  04/12/2024   Diabetic kidney evaluation - Urine ACR  04/12/2024   OPHTHALMOLOGY EXAM  04/25/2024   Medicare Annual Wellness (AWV)  09/12/2024   MAMMOGRAM  03/06/2025   Colonoscopy  12/08/2025   Cervical Cancer Screening (HPV/Pap Cotest)  05/28/2026   DTaP/Tdap/Td (4 - Td or Tdap) 09/27/2030   COVID-19 Vaccine  Completed   Hepatitis C Screening  Completed   HIV Screening  Completed   Zoster Vaccines- Shingrix  Completed   HPV VACCINES  Aged Out   Meningococcal B Vaccine  Aged Out    Health Maintenance  Health Maintenance Due  Topic Date Due   Pneumococcal Vaccine 63-40 Years old (2 of 2 - PCV) 09/06/2018    Colorectal cancer screening: Type of screening: Colonoscopy. Completed 12/09/2022. Repeat every 10 years  Mammogram status: Completed 03/07/2023. Repeat every year  Bone Density status: Ordered 09/13/2023. Pt provided with contact info and advised to call to schedule appt.  Lung Cancer Screening: (Low Dose CT Chest recommended if Age 4-80 years, 20 pack-year currently smoking OR have quit w/in 15years.) does qualify.   Lung Cancer Screening Referral: completed 2023  Additional Screening:  Hepatitis C Screening: does qualify; Completed   Vision Screening: Recommended annual ophthalmology  exams for early detection of glaucoma and other disorders of the eye. Is the patient up to date with their annual eye exam?  {YES/NO:21197} Who is the provider or what is the name of the office in which the patient attends annual eye exams? *** If pt is not established with a provider, would they like to be referred to a provider to establish care? {YES/NO:21197}.   Dental Screening: Recommended annual dental exams for proper oral hygiene  Diabetic Foot Exam: Diabetic Foot Exam: Overdue, Pt has been advised about the importance in completing this exam. Pt is scheduled for diabetic foot exam on next visit.  Community Resource Referral / Chronic Care Management: CRR required this visit?  No   CCM required this visit?  No     Plan:     I have personally reviewed and noted the following in the patient's chart:   Medical and social history Use of alcohol, tobacco or illicit drugs  Current medications and supplements including opioid prescriptions. Patient is not currently taking opioid prescriptions. Functional ability and status Nutritional status Physical activity Advanced directives List of other physicians Hospitalizations, surgeries, and ER visits in previous 12 months Vitals Screenings to include cognitive, depression, and falls Referrals and appointments  In addition, I have reviewed and discussed with patient certain preventive protocols, quality metrics, and best practice recommendations. A written personalized care plan for preventive services as well as general preventive health recommendations were provided to patient.     Alvina Axon   09/13/2023   After Visit Summary: (MyChart) Due to this being a telephonic visit, the after visit summary with patients personalized plan was offered to patient via MyChart   Nurse Notes:

## 2023-10-03 ENCOUNTER — Other Ambulatory Visit (HOSPITAL_COMMUNITY): Payer: Self-pay

## 2023-10-03 ENCOUNTER — Other Ambulatory Visit: Payer: Self-pay

## 2023-10-03 MED FILL — Lancets: 90 days supply | Qty: 300 | Fill #0 | Status: AC

## 2023-10-03 MED FILL — Glucose Blood Test Strip: 90 days supply | Qty: 300 | Fill #0 | Status: AC

## 2023-10-03 MED FILL — Alcohol Swabs: 90 days supply | Qty: 300 | Fill #0 | Status: AC

## 2023-10-04 ENCOUNTER — Other Ambulatory Visit: Payer: Self-pay | Admitting: Family Medicine

## 2023-10-05 ENCOUNTER — Other Ambulatory Visit (HOSPITAL_COMMUNITY): Payer: Self-pay

## 2023-10-05 MED ORDER — EZETIMIBE 10 MG PO TABS
10.0000 mg | ORAL_TABLET | Freq: Every day | ORAL | 1 refills | Status: DC
  Filled 2023-10-05 – 2023-11-04 (×2): qty 90, 90d supply, fill #0
  Filled 2024-01-11: qty 90, 90d supply, fill #1

## 2023-10-05 NOTE — Telephone Encounter (Signed)
 Requested Prescriptions  Pending Prescriptions Disp Refills   ezetimibe  (ZETIA ) 10 MG tablet 90 tablet 1    Sig: Take 1 tablet (10 mg total) by mouth daily.     Cardiovascular:  Antilipid - Sterol Transport Inhibitors Failed - 10/05/2023  3:49 PM      Failed - Lipid Panel in normal range within the last 12 months    Cholesterol  Date Value Ref Range Status  04/13/2023 98 <200 mg/dL Final   LDL Cholesterol (Calc)  Date Value Ref Range Status  04/13/2023 43 mg/dL (calc) Final    Comment:    Reference range: <100 . Desirable range <100 mg/dL for primary prevention;   <70 mg/dL for patients with CHD or diabetic patients  with > or = 2 CHD risk factors. Aaron Aas LDL-C is now calculated using the Martin-Hopkins  calculation, which is a validated novel method providing  better accuracy than the Friedewald equation in the  estimation of LDL-C.  Melinda Sprawls et al. Erroll Heard. 1610;960(45): 2061-2068  (http://education.[employer].com/faq/FAQ164)    HDL  Date Value Ref Range Status  04/13/2023 36 (L) > OR = 50 mg/dL Final   Triglycerides  Date Value Ref Range Status  04/13/2023 108 <150 mg/dL Final         Passed - AST in normal range and within 360 days    AST  Date Value Ref Range Status  04/13/2023 15 10 - 35 U/L Final         Passed - ALT in normal range and within 360 days    ALT  Date Value Ref Range Status  04/13/2023 11 6 - 29 U/L Final         Passed - Patient is not pregnant      Passed - Valid encounter within last 12 months    Recent Outpatient Visits           5 months ago Controlled type 2 diabetes mellitus with complication, without long-term current use of insulin (HCC)   [place] [facility] [facility] Austine Lefort, MD   1 year ago Controlled type 2 diabetes mellitus with complication, without long-term current use of insulin [facility])   [place] [facility] Pickard, Cisco Crest, MD   1 year ago Essential hypertension   [place]  [facility] Pickard, Cisco Crest, MD       Future Appointments             In 1 week Strader, Dimple Francis, PA-C [place] [facility], [place] [facility]   In 2 weeks Cheril Cork, Cisco Crest, MD [facility], [facility]

## 2023-10-06 ENCOUNTER — Other Ambulatory Visit (HOSPITAL_COMMUNITY): Payer: Self-pay

## 2023-10-07 ENCOUNTER — Other Ambulatory Visit (HOSPITAL_BASED_OUTPATIENT_CLINIC_OR_DEPARTMENT_OTHER): Payer: Self-pay

## 2023-10-07 ENCOUNTER — Other Ambulatory Visit (HOSPITAL_COMMUNITY): Payer: Self-pay

## 2023-10-07 ENCOUNTER — Other Ambulatory Visit: Payer: Self-pay | Admitting: Family Medicine

## 2023-10-07 MED ORDER — ACCU-CHEK AVIVA PLUS VI STRP
ORAL_STRIP | 3 refills | Status: DC
  Filled 2023-10-07: qty 300, 90d supply, fill #0

## 2023-10-10 ENCOUNTER — Other Ambulatory Visit: Payer: Self-pay | Admitting: Family Medicine

## 2023-10-10 MED ORDER — BLOOD GLUCOSE MONITORING SUPPL DEVI: 1.0000 | Freq: Three times a day (TID) | 0 refills | Status: DC

## 2023-10-10 MED ORDER — LANCETS MISC. MISC: 1.0000 | Freq: Three times a day (TID) | 0 refills | Status: DC

## 2023-10-10 MED ORDER — LANCET DEVICE MISC: 1.0000 | Freq: Three times a day (TID) | 0 refills | Status: AC

## 2023-10-10 MED ORDER — BLOOD GLUCOSE TEST VI STRP: 1.0000 | ORAL_STRIP | Freq: Three times a day (TID) | 0 refills | Status: AC

## 2023-10-10 NOTE — Telephone Encounter (Signed)
 Requested medication (s) are due for refill today - no  Requested medication (s) are on the active medication list -yes  Future visit scheduled -yes  Last refill: 10/10/23  Notes to clinic:   Pharmacy comment: unaware that the aviva plus machine was DC'd, pt will need to change to one touch    Requested Prescriptions  Pending Prescriptions Disp Refills   Blood Glucose Monitoring Suppl (ACCU-CHEK AVIVA PLUS) w/Device KIT 1 kit 0    Sig: Use as directed     Endocrinology: Diabetes - Testing Supplies Passed - 10/10/2023  1:43 PM      Passed - Valid encounter within last 12 months    Recent Outpatient Visits           5 months ago Controlled type 2 diabetes mellitus with complication, without long-term current use of insulin (HCC)   [place] [facility] Austine Lefort, MD   1 year ago Controlled type 2 diabetes mellitus with complication, without long-term current use of insulin [facility])   [place] [facility] Pickard, Cisco Crest, MD   1 year ago Essential hypertension   [place] [facility] Pickard, Cisco Crest, MD       Future Appointments             In 4 days Finis Hugger, Dimple Francis, PA-C [place] [facility], [place] [facility]   In 1 week Cheril Cork, Cisco Crest, MD [facility], [facility] [place]               Requested Prescriptions  Pending Prescriptions Disp Refills   Blood Glucose Monitoring Suppl (ACCU-CHEK AVIVA PLUS) w/Device KIT 1 kit 0    Sig: Use as directed     Endocrinology: Diabetes - Testing Supplies Passed - 10/10/2023  1:43 PM      Passed - Valid encounter within last 12 months    Recent Outpatient Visits           5 months ago Controlled type 2 diabetes mellitus with complication, without long-term current use of insulin (HCC)   [place] [facility] Austine Lefort, MD   1 year ago Controlled type 2 diabetes mellitus with complication, without  long-term current use of insulin [facility])   [place] [facility] Pickard, Cisco Crest, MD   1 year ago Essential hypertension   [facility] Pickard, Cisco Crest, MD       Future Appointments             In 4 days Strader, Dimple Francis, PA-C [place] [facility], [place] [facility]   In 1 week Pickard, Cisco Crest, MD [place] [facility], [facility]

## 2023-10-11 ENCOUNTER — Other Ambulatory Visit (HOSPITAL_COMMUNITY): Payer: Self-pay

## 2023-10-12 ENCOUNTER — Other Ambulatory Visit

## 2023-10-12 DIAGNOSIS — Z13220 Encounter for screening for lipoid disorders: Secondary | ICD-10-CM

## 2023-10-12 DIAGNOSIS — E039 Hypothyroidism, unspecified: Secondary | ICD-10-CM | POA: Diagnosis not present

## 2023-10-12 DIAGNOSIS — E66812 Obesity, class 2: Secondary | ICD-10-CM | POA: Diagnosis not present

## 2023-10-12 DIAGNOSIS — I10 Essential (primary) hypertension: Secondary | ICD-10-CM

## 2023-10-12 DIAGNOSIS — E118 Type 2 diabetes mellitus with unspecified complications: Secondary | ICD-10-CM

## 2023-10-12 DIAGNOSIS — Z6835 Body mass index (BMI) 35.0-35.9, adult: Secondary | ICD-10-CM

## 2023-10-12 DIAGNOSIS — E538 Deficiency of other specified B group vitamins: Secondary | ICD-10-CM

## 2023-10-13 ENCOUNTER — Other Ambulatory Visit (HOSPITAL_COMMUNITY): Payer: Self-pay

## 2023-10-13 ENCOUNTER — Ambulatory Visit: Payer: Self-pay | Admitting: Family Medicine

## 2023-10-13 ENCOUNTER — Telehealth: Payer: Self-pay | Admitting: Pharmacy Technician

## 2023-10-13 LAB — CBC WITH DIFFERENTIAL/PLATELET
Absolute Lymphocytes: 2426 {cells}/uL (ref 850–3900)
Absolute Monocytes: 574 {cells}/uL (ref 200–950)
Basophils Absolute: 69 {cells}/uL (ref 0–200)
Basophils Relative: 0.7 %
Eosinophils Absolute: 119 {cells}/uL (ref 15–500)
Eosinophils Relative: 1.2 %
HCT: 34.6 % — ABNORMAL LOW (ref 35.0–45.0)
Hemoglobin: 11 g/dL — ABNORMAL LOW (ref 11.7–15.5)
MCH: 28.1 pg (ref 27.0–33.0)
MCHC: 31.8 g/dL — ABNORMAL LOW (ref 32.0–36.0)
MCV: 88.5 fL (ref 80.0–100.0)
MPV: 10.1 fL (ref 7.5–12.5)
Monocytes Relative: 5.8 %
Neutro Abs: 6712 {cells}/uL (ref 1500–7800)
Neutrophils Relative %: 67.8 %
Platelets: 316 10*3/uL (ref 140–400)
RBC: 3.91 10*6/uL (ref 3.80–5.10)
RDW: 14.8 % (ref 11.0–15.0)
Total Lymphocyte: 24.5 %
WBC: 9.9 10*3/uL (ref 3.8–10.8)

## 2023-10-13 LAB — COMPLETE METABOLIC PANEL WITHOUT GFR
AG Ratio: 1.8 (calc) (ref 1.0–2.5)
ALT: 12 U/L (ref 6–29)
AST: 17 U/L (ref 10–35)
Albumin: 4.4 g/dL (ref 3.6–5.1)
Alkaline phosphatase (APISO): 80 U/L (ref 37–153)
BUN: 16 mg/dL (ref 7–25)
CO2: 28 mmol/L (ref 20–32)
Calcium: 9.6 mg/dL (ref 8.6–10.4)
Chloride: 100 mmol/L (ref 98–110)
Creat: 0.62 mg/dL (ref 0.50–1.05)
Globulin: 2.5 g/dL (ref 1.9–3.7)
Glucose, Bld: 93 mg/dL (ref 65–99)
Potassium: 4.6 mmol/L (ref 3.5–5.3)
Sodium: 136 mmol/L (ref 135–146)
Total Bilirubin: 0.4 mg/dL (ref 0.2–1.2)
Total Protein: 6.9 g/dL (ref 6.1–8.1)

## 2023-10-13 LAB — HEMOGLOBIN A1C
Hgb A1c MFr Bld: 6.1 % — ABNORMAL HIGH (ref <5.7)
Mean Plasma Glucose: 128 mg/dL
eAG (mmol/L): 7.1 mmol/L

## 2023-10-13 LAB — LIPID PANEL
Cholesterol: 100 mg/dL (ref <200)
HDL: 43 mg/dL — ABNORMAL LOW (ref >=50)
LDL Cholesterol (Calc): 41 mg/dL
Non-HDL Cholesterol (Calc): 57 mg/dL (ref <130)
Total CHOL/HDL Ratio: 2.3 (calc) (ref <5.0)
Triglycerides: 78 mg/dL (ref <150)

## 2023-10-13 LAB — TSH: TSH: 1.83 m[IU]/L (ref 0.40–4.50)

## 2023-10-13 LAB — VITAMIN B12: Vitamin B-12: 700 pg/mL (ref 200–1100)

## 2023-10-13 NOTE — Telephone Encounter (Signed)
 Pharmacy Patient Advocate Encounter   Received notification from Onbase that prior authorization for Accu-Chek Guide Test strips is required/requested.   Insurance verification completed.   The patient is insured through [facility] ADVANTAGE/RX ADVANCE .   Per test claim: Refill too soon. PA is not needed at this time. Medication was filled 10/03/23. Next eligible fill date is 12/07/23.  [facility] filled a 3 month supply on 10/03/23

## 2023-10-14 ENCOUNTER — Ambulatory Visit: Attending: Student | Admitting: Student

## 2023-10-14 ENCOUNTER — Encounter: Payer: Self-pay | Admitting: Student

## 2023-10-14 VITALS — BP 118/84 | HR 57 | Ht 66.0 in | Wt 175.8 lb

## 2023-10-14 DIAGNOSIS — I2510 Atherosclerotic heart disease of native coronary artery without angina pectoris: Secondary | ICD-10-CM | POA: Diagnosis not present

## 2023-10-14 DIAGNOSIS — E782 Mixed hyperlipidemia: Secondary | ICD-10-CM

## 2023-10-14 DIAGNOSIS — I5032 Chronic diastolic (congestive) heart failure: Secondary | ICD-10-CM | POA: Diagnosis not present

## 2023-10-14 DIAGNOSIS — I739 Peripheral vascular disease, unspecified: Secondary | ICD-10-CM

## 2023-10-14 DIAGNOSIS — I10 Essential (primary) hypertension: Secondary | ICD-10-CM

## 2023-10-14 NOTE — Progress Notes (Signed)
 Cardiology Office Note    Date:  10/15/2023  ID:  [name], DOB [birth year], MRN [id number] Cardiologist: Armida Lander, MD    History of Present Illness:    [name] is a 63 y.o. 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  She was last examined by Dr. Amanda [name] in 01/2023 and denied any recent chest pain, palpitations or claudication at that time. She did have known sleep apnea but previously had difficulty tolerating CPAP and was not interested in repeat evaluation at that time. She was continued on her current cardiac medications with Amlodipine  10 mg daily, ASA 81 mg daily, Coreg  25 mg twice daily, Zetia  10 mg daily, Lasix  20 mg daily, Losartan  100 mg daily and Crestor  20 mg daily.  In talking with the patient today, she reports overall doing well from a cardiac perspective since her last office visit. Her activity is limited due to knee and back pain but she still performs routine chores around her home and walks around the grocery store. She denies any chest pain or dyspnea on exertion with activity. No recent palpitations, orthopnea, PND or pitting edema.  Studies Reviewed:   EKG: EKG is ordered today and demonstrates:   EKG Interpretation Date/Time:  Friday Oct 14 2023 14:00:01 EDT Ventricular Rate:  57 PR Interval:  170 QRS Duration:  96 QT Interval:  416 QTC Calculation: 404 R Axis:   -8  Text Interpretation: Sinus bradycardia Minimal voltage criteria for LVH, may be normal variant ( R in aVL ) Inferior infarct pattern Confirmed by [name] ([id number]) on 10/14/2023 2:14:14 PM       Echocardiogram: 10/2013 Study Conclusions   - Left ventricle: The cavity size was normal. There was moderate    concentric hypertrophy.  Systolic function was normal. The    estimated ejection fraction was in the range of 55% to 60%. Wall    motion was normal; there were no regional wall motion    abnormalities. Left ventricular diastolic function parameters    were normal.  - Left atrium: The atrium was mildly dilated.  - Tricuspid valve: There was trivial regurgitation.  - Inferior vena cava: The vessel was normal in size. The    respirophasic diameter changes were in the normal range (= 50%),    consistent with normal central venous pressure.    Physical Exam:   VS:  BP 118/84   Pulse (!) 57   Ht 5\' 6"  (1.676 m)   Wt 175 lb 12.8 oz (79.7 kg)   SpO2 94%   BMI 28.37 kg/m    Wt Readings from Last 3 Encounters:  10/14/23 175 lb 12.8 oz (79.7 kg)  09/13/23 175 lb (79.4 kg)  04/19/23 175 lb 8 oz (79.6 kg)     GEN: Well nourished, well developed female appearing in no acute distress NECK: No JVD; No carotid bruits CARDIAC: RRR, no murmurs, rubs, gallops RESPIRATORY:  Clear to auscultation without rales, wheezing or rhonchi  ABDOMEN: Appears non-distended. No obvious abdominal masses. EXTREMITIES: No clubbing or cyanosis. No pitting edema.  Distal pedal pulses are 2+ bilaterally.   Assessment and Plan:   1. CAD - Prior cardiac catheterization in 2009 showed a CTO of the RCA. She denies any recent anginal symptoms. Continue current  medical therapy with ASA 81 mg daily, Coreg  25 mg twice daily, Zetia  10 mg daily and Crestor  20 mg daily.  2. Chronic HFimpEF - Her EF was previously 25-30% in 2009, normalized to 55-60% by echo in 10/2013. She denies any recent respiratory issues and appears euvolemic by examination today. Continue Coreg  25 mg twice daily, Losartan  100 mg daily and Lasix  20 mg daily. Creatinine was stable at 0.62 when checked earlier this week.  3. HTN - BP is well-controlled at 118/84 during today's visit. Continue current medical therapy with Amlodipine  10 mg daily, Coreg  25 mg twice daily and  Losartan  100 mg daily.  4. HLD - FLP earlier this month showed total cholesterol 100, HDL 43, triglycerides 78 and LDL 41. Continue current medical therapy with Crestor  20 mg daily and Zetia  10 mg daily.  5. PAD - Most recent ABI in 04/2019 showed normal right ABI and moderately decreased left ABI.  She does have arthritic pain but does not seem consistent with claudication. Remains on ASA 81 mg daily, Zetia  10 mg daily and Crestor  20 mg daily.   Signed, [name] Gash, PA-C

## 2023-10-14 NOTE — Patient Instructions (Signed)

## 2023-10-15 ENCOUNTER — Encounter: Payer: Self-pay | Admitting: Student

## 2023-10-20 ENCOUNTER — Encounter: Payer: Self-pay | Admitting: Family Medicine

## 2023-10-20 ENCOUNTER — Ambulatory Visit: Admitting: Family Medicine

## 2023-10-20 ENCOUNTER — Ambulatory Visit (INDEPENDENT_AMBULATORY_CARE_PROVIDER_SITE_OTHER): Admitting: Family Medicine

## 2023-10-20 VITALS — BP 120/62 | HR 52 | Temp 97.7°F | Ht 66.0 in | Wt 170.8 lb

## 2023-10-20 DIAGNOSIS — R710 Precipitous drop in hematocrit: Secondary | ICD-10-CM

## 2023-10-20 DIAGNOSIS — E118 Type 2 diabetes mellitus with unspecified complications: Secondary | ICD-10-CM

## 2023-10-20 DIAGNOSIS — Z122 Encounter for screening for malignant neoplasm of respiratory organs: Secondary | ICD-10-CM

## 2023-10-20 DIAGNOSIS — E039 Hypothyroidism, unspecified: Secondary | ICD-10-CM

## 2023-10-20 DIAGNOSIS — I739 Peripheral vascular disease, unspecified: Secondary | ICD-10-CM | POA: Diagnosis not present

## 2023-10-20 DIAGNOSIS — I2510 Atherosclerotic heart disease of native coronary artery without angina pectoris: Secondary | ICD-10-CM

## 2023-10-20 DIAGNOSIS — F17200 Nicotine dependence, unspecified, uncomplicated: Secondary | ICD-10-CM

## 2023-10-20 DIAGNOSIS — I10 Essential (primary) hypertension: Secondary | ICD-10-CM | POA: Diagnosis not present

## 2023-10-20 DIAGNOSIS — Z7984 Long term (current) use of oral hypoglycemic drugs: Secondary | ICD-10-CM

## 2023-10-20 NOTE — Progress Notes (Signed)
 Subjective:    Patient ID: [name], female    DOB: [birth year], 63 y.o.   MRN: [id number]  Patient is a very pleasant 63 year old Caucasian female who presents today for follow-up.  She has a history of congestive heart failure as well as peripheral vascular disease with abnormal ABI in the left leg in 2020.  Unfortunately, the patient continues to smoke.  We discussed this every visit.  I recommended smoking cessation.  She is overdue for a CT scan to screen for lung cancer.  She denies any chest pain or shortness of breath or dyspnea on exertion.  Her most recent lab work is listed below Lab on 10/12/2023  Component Date Value Ref Range Status   WBC 10/12/2023 9.9  3.8 - 10.8 Thousand/uL Final   RBC 10/12/2023 3.91  3.80 - 5.10 Million/uL Final   Hemoglobin 10/12/2023 11.0 (L)  11.7 - 15.5 g/dL Final   HCT 57/84/6962 34.6 (L)  35.0 - 45.0 % Final   MCV 10/12/2023 88.5  80.0 - 100.0 fL Final   MCH 10/12/2023 28.1  27.0 - 33.0 pg Final   MCHC 10/12/2023 31.8 (L)  32.0 - 36.0 g/dL Final   Comment: For adults, a slight decrease in the calculated MCHC value (in the range of 30 to 32 g/dL) is most likely not clinically significant; however, it should be interpreted with caution in correlation with other red cell parameters and the patient's clinical condition.    RDW 10/12/2023 14.8  11.0 - 15.0 % Final   Platelets 10/12/2023 316  140 - 400 Thousand/uL Final   MPV 10/12/2023 10.1  7.5 - 12.5 fL Final   Neutro Abs 10/12/2023 6,712  1,500 - 7,800 cells/uL Final   Absolute Lymphocytes 10/12/2023 2,426  850 - 3,900 cells/uL Final   Absolute Monocytes 10/12/2023 574  200 - 950 cells/uL Final   Eosinophils Absolute 10/12/2023 119  15 - 500 cells/uL Final   Basophils Absolute 10/12/2023 69  0 - 200 cells/uL Final   Neutrophils Relative % 10/12/2023 67.8  % Final   Total Lymphocyte 10/12/2023 24.5  % Final   Monocytes Relative 10/12/2023 5.8  % Final   Eosinophils Relative 10/12/2023  1.2  % Final   Basophils Relative 10/12/2023 0.7  % Final   Glucose, Bld 10/12/2023 93  65 - 99 mg/dL Final   Comment: .            Fasting reference interval .    BUN 10/12/2023 16  7 - 25 mg/dL Final   Creat 95/28/4132 0.62  0.50 - 1.05 mg/dL Final   BUN/Creatinine Ratio 10/12/2023 SEE NOTE:  6 - 22 (calc) Final   Comment:    Not Reported: BUN and Creatinine are within    reference range. .    Sodium 10/12/2023 136  135 - 146 mmol/L Final   Potassium 10/12/2023 4.6  3.5 - 5.3 mmol/L Final   Chloride 10/12/2023 100  98 - 110 mmol/L Final   CO2 10/12/2023 28  20 - 32 mmol/L Final   Calcium  10/12/2023 9.6  8.6 - 10.4 mg/dL Final   Total Protein 44/05/270 6.9  6.1 - 8.1 g/dL Final   Albumin 53/66/4403 4.4  3.6 - 5.1 g/dL Final   Globulin 47/42/5956 2.5  1.9 - 3.7 g/dL (calc) Final   AG Ratio 10/12/2023 1.8  1.0 - 2.5 (calc) Final   Total Bilirubin 10/12/2023 0.4  0.2 - 1.2 mg/dL Final   Alkaline phosphatase (APISO) 10/12/2023 80  37 - 153 U/L Final   AST 10/12/2023 17  10 - 35 U/L Final   ALT 10/12/2023 12  6 - 29 U/L Final   Hgb A1c MFr Bld 10/12/2023 6.1 (H)  <5.7 % Final   Comment: For someone without known diabetes, a hemoglobin  A1c value between 5.7% and 6.4% is consistent with prediabetes and should be confirmed with a  follow-up test. . For someone with known diabetes, a value <7% indicates that their diabetes is well controlled. A1c targets should be individualized based on duration of diabetes, age, comorbid conditions, and other considerations. . This assay result is consistent with an increased risk of diabetes. . Currently, no consensus exists regarding use of hemoglobin A1c for diagnosis of diabetes for children. .    Mean Plasma Glucose 10/12/2023 128  mg/dL Final   eAG (mmol/L) 82/95/6213 7.1  mmol/L Final   Cholesterol 10/12/2023 100  <200 mg/dL Final   HDL 08/65/7846 43 (L)  > OR = 50 mg/dL Final   Triglycerides 96/29/5284 78  <150 mg/dL Final   LDL  Cholesterol (Calc) 10/12/2023 41  mg/dL (calc) Final   Comment: Reference range: <100 . Desirable range <100 mg/dL for primary prevention;   <70 mg/dL for patients with CHD or diabetic patients  with > or = 2 CHD risk factors. Aaron Aas LDL-C is now calculated using the Martin-Hopkins  calculation, which is a validated novel method providing  better accuracy than the Friedewald equation in the  estimation of LDL-C.  Melinda Sprawls et al. Erroll Heard. 1324;401(02): 2061-2068  (http://education.[facility].com/faq/FAQ164)    Total CHOL/HDL Ratio 10/12/2023 2.3  <7.2 (calc) Final   Non-HDL Cholesterol (Calc) 10/12/2023 57  <130 mg/dL (calc) Final   Comment: For patients with diabetes plus 1 major ASCVD risk  factor, treating to a non-HDL-C goal of <100 mg/dL  (LDL-C of <53 mg/dL) is considered a therapeutic  option.    TSH 10/12/2023 1.83  0.40 - 4.50 mIU/L Final   Vitamin B-12 10/12/2023 700  200 - 1,100 pg/mL Final    Past Medical History:  Diagnosis Date   Anemia    Arthritis    knees   Blood transfusion 08/2007   CHF (congestive heart failure) (HCC)    Claudication (HCC)    Coronary artery disease    Diabetes mellitus    GERD (gastroesophageal reflux disease)    Hyperlipidemia    Hypertension    Hypothyroidism    PVD (peripheral vascular disease) (HCC)    PVD (peripheral vascular disease) (HCC)    Sleep apnea    does not wear CPAP   Tobacco abuse    Past Surgical History:  Procedure Laterality Date   COLONOSCOPY  08/2011   normal DP [facility]   COLONOSCOPY WITH PROPOFOL   12/09/2022   CD at [facility]   Current Outpatient Medications on File Prior to Visit  Medication Sig Dispense Refill   Accu-Chek Softclix Lancets lancets USE AS INSTRUCTED TO TEST BLOOD SUGAR 300 each 3   Alcohol  Swabs  (DROPSAFE ALCOHOL  PREP) 70 % PADS USE AS DIRECTED 300 each 3   amLODipine  (NORVASC ) 10 MG tablet TAKE 1 TABLET EVERY DAY 90 tablet 1   aspirin  EC 81 MG tablet Take 1 tablet (81 mg total) by mouth  daily. Swallow whole. 90 tablet 3   Blood Glucose Calibration (ACCU-CHEK AVIVA) SOLN USE AS DIRECTED 1 each 3   Blood Glucose Monitoring Suppl (ACCU-CHEK AVIVA PLUS) w/Device KIT Use as directed 1 kit 0   Blood Glucose  Monitoring Suppl DEVI 1 each by Does not apply route in the morning, at noon, and at bedtime. May substitute to any manufacturer covered by patient's insurance. 1 each 0   carvedilol  (COREG ) 25 MG tablet Take 1 tablet (25 mg total) by mouth 2 (two) times daily with a meal. 180 tablet 0   Cyanocobalamin  (VITAMIN B-12 CR PO) Take by mouth.     ezetimibe  (ZETIA ) 10 MG tablet Take 1 tablet (10 mg total) by mouth daily. 90 tablet 1   furosemide  (LASIX ) 20 MG tablet TAKE 1 TABLET EVERY DAY (NEED MD APPOINTMENT FOR REFILLS) 60 tablet 5   glucose blood (ACCU-CHEK AVIVA PLUS) test strip USE AS INSTRUCTED TO CHECK BLOOD SUGAR 300 strip 3   Glucose Blood (BLOOD GLUCOSE TEST STRIPS) STRP 1 each by In Vitro route in the morning, at noon, and at bedtime. May substitute to any manufacturer covered by patient's insurance. 100 strip 0   Lancet Device MISC 1 each by Does not apply route in the morning, at noon, and at bedtime. May substitute to any manufacturer covered by patient's insurance. 1 each 0   levothyroxine  (SYNTHROID ) 100 MCG tablet Take 1 tablet (100 mcg total) by mouth daily. 90 tablet 3   losartan  (COZAAR ) 100 MG tablet TAKE 1 TABLET EVERY DAY (NEED MD APPOINTMENT FOR REFILLS) 60 tablet 5   metFORMIN  (GLUCOPHAGE ) 1000 MG tablet TAKE 1 TABLET TWICE DAILY WITH MEALS 180 tablet 1   nitroGLYCERIN  (NITROSTAT ) 0.4 MG SL tablet DISSOLVE 1 TABLET UNDER THE TONGUE EVERY 5 MINUTES AS NEEDED AS DIRECTED 100 tablet 3   Omega-3 Fatty Acids (FISH OIL) 1000 MG CAPS Take by mouth.     omeprazole  (PRILOSEC) 40 MG capsule TAKE 1 CAPSULE EVERY DAY (NEED MD APPOINTMENT FOR REFILLS) 60 capsule 5   rosuvastatin  (CRESTOR ) 20 MG tablet Take 1 tablet (20 mg total) by mouth daily. 90 tablet 3   No current  facility-administered medications on file prior to visit.   No Known Allergies Social History   Socioeconomic History   Marital status: Legally Separated    Spouse name: Not on file   Number of children: 0   Years of education: Not on file   Highest education level: Not on file  Occupational History   Not on file  Tobacco Use   Smoking status: Every Day    Current packs/day: 0.50    Average packs/day: 0.5 packs/day for 32.0 years (16.0 ttl pk-yrs)    Types: Cigarettes   Smokeless tobacco: Never  Vaping Use   Vaping status: Never Used  Substance and Sexual Activity   Alcohol  use: No   Drug use: No   Sexual activity: Yes    Birth control/protection: Post-menopausal  Other Topics Concern   Not on file  Social History Narrative   Not on file   Social Drivers of Health   Financial Resource Strain: Low Risk  (09/09/2023)   Overall Financial Resource Strain (CARDIA)    Difficulty of Paying Living Expenses: Not very hard  Food Insecurity: Patient Declined (09/09/2023)   Hunger Vital Sign    Worried About Running Out of Food in the Last Year: Patient declined    Ran Out of Food in the Last Year: Patient declined  Transportation Needs: No Transportation Needs (09/09/2023)   PRAPARE - Administrator, Civil Service (Medical): No    Lack of Transportation (Non-Medical): No  Physical Activity: Insufficiently Active (09/09/2023)   Exercise Vital Sign    Days of  Exercise per Week: 3 days    Minutes of Exercise per Session: 20 min  Stress: No Stress Concern Present (09/09/2023)   [employer] of Occupational Health - Occupational Stress Questionnaire    Feeling of Stress : Only a little  Social Connections: Unknown (09/09/2023)   Social Connection and Isolation Panel [NHANES]    Frequency of Communication with Friends and Family: Three times a week    Frequency of Social Gatherings with Friends and Family: Patient declined    Attends Religious Services: Patient  declined    Active Member of Clubs or Organizations: Patient declined    Attends Banker Meetings: Not on file    Marital Status: Patient declined  Intimate Partner Violence: Not At Risk (05/05/2022)   Humiliation, Afraid, Rape, and Kick questionnaire    Fear of Current or Ex-Partner: No    Emotionally Abused: No    Physically Abused: No    Sexually Abused: No     Review of Systems  All other systems reviewed and are negative.      Objective:   Physical Exam Vitals reviewed.  Constitutional:      General: She is not in acute distress.    Appearance: She is well-developed. She is not diaphoretic.  HENT:     Mouth/Throat:     Pharynx: No oropharyngeal exudate.  Eyes:     General: No scleral icterus.    Conjunctiva/sclera: Conjunctivae normal.  Neck:     Thyroid : No thyromegaly.     Vascular: No JVD.  Cardiovascular:     Rate and Rhythm: Normal rate and regular rhythm.     Heart sounds: Murmur heard.     No friction rub. No gallop.  Pulmonary:     Effort: Pulmonary effort is normal. No respiratory distress.     Breath sounds: Rales present. No wheezing.  Abdominal:     General: Bowel sounds are normal. There is no distension.     Palpations: Abdomen is soft.     Tenderness: There is no abdominal tenderness. There is no guarding or rebound.  Musculoskeletal:     Cervical back: Neck supple.  Lymphadenopathy:     Cervical: No cervical adenopathy.     Assessment & Plan:  Controlled type 2 diabetes mellitus with complication, without long-term current use of insulin (HCC)  Hypothyroidism, unspecified type  Essential hypertension  TOBACCO ABUSE - Plan: CT CHEST LUNG CA SCREEN LOW DOSE W/O CM  Screening for lung cancer - Plan: CT CHEST LUNG CA SCREEN LOW DOSE W/O CM  PVD (peripheral vascular disease) (HCC)  Coronary artery disease involving native coronary artery of native heart without angina pectoris  Hemoglobin drop - Plan: CBC with  Differential/Platelet Labs look outstanding except for a drop in her hemoglobin.  Not sure what to make of this.  I recommended that we repeat it again in 1 month to monitor trend.  There continues to be a problem I will check GI for loss.  Her weight is outstanding.  LDL is well below his goal of 55.  A1c is below 6.5.  Therefore I am very happy.  Recommended smoking cessation.  Schedule the patient for a CT scan of her lungs to screen for lung cancer

## 2023-11-03 ENCOUNTER — Telehealth: Payer: Self-pay

## 2023-11-03 NOTE — Telephone Encounter (Signed)
 Copied from CRM [id number]. Topic: Clinical - Request for Lab/Test Order >> Nov 03, 2023 10:48 AM Opal Bill wrote: Reason for CRM: Pt wanting to get her Hemoglobin A1C blood work done. Looking to come in 6/18 for the morning. Please follow up.

## 2023-11-04 ENCOUNTER — Other Ambulatory Visit (HOSPITAL_COMMUNITY): Payer: Self-pay

## 2023-11-04 ENCOUNTER — Other Ambulatory Visit: Payer: Self-pay

## 2023-11-06 ENCOUNTER — Other Ambulatory Visit: Payer: Self-pay | Admitting: Family Medicine

## 2023-11-07 ENCOUNTER — Other Ambulatory Visit: Payer: Self-pay

## 2023-11-07 ENCOUNTER — Other Ambulatory Visit (HOSPITAL_COMMUNITY): Payer: Self-pay

## 2023-11-07 MED ORDER — CARVEDILOL 25 MG PO TABS
25.0000 mg | ORAL_TABLET | Freq: Two times a day (BID) | ORAL | 0 refills | Status: DC
  Filled 2023-11-07: qty 180, 90d supply, fill #0

## 2023-11-14 ENCOUNTER — Telehealth: Payer: Self-pay | Admitting: Acute Care

## 2023-11-14 DIAGNOSIS — F17210 Nicotine dependence, cigarettes, uncomplicated: Secondary | ICD-10-CM

## 2023-11-14 DIAGNOSIS — Z87891 Personal history of nicotine dependence: Secondary | ICD-10-CM

## 2023-11-14 DIAGNOSIS — Z122 Encounter for screening for malignant neoplasm of respiratory organs: Secondary | ICD-10-CM

## 2023-11-14 NOTE — Telephone Encounter (Signed)
 Lung Cancer Screening Narrative/Criteria Questionnaire (Cigarette Smokers Only- No Cigars/Pipes/vapes)   [name]   SDMV:11/22/2023 11:30a [name]        1960/10/07   LDCT: 12/06/2023 10:30a AP    62 y.o.   Phone: [phone]  Lung Screening Narrative (confirm age 19-77 yrs Medicare / 50-80 yrs Private pay insurance)   Insurance information:HTA   Referring Provider:Dr. Cheril Cork   This screening involves an initial phone call with a team member from our program. It is called a shared decision making visit. The initial meeting is required by  insurance and Medicare to make sure you understand the program. This appointment takes about 15-20 minutes to complete. You will complete the screening scan at your scheduled date/time.  This scan takes about 5-10 minutes to complete. You can eat and drink normally before and after the scan.  Criteria questions for Lung Cancer Screening:   Are you a current or former smoker? Current Age began smoking: 63yo   If you are a former smoker, what year did you quit smoking? N/A(within 15 yrs)   To calculate your smoking history, I need an accurate estimate of how many packs of cigarettes you smoked per day and for how many years. (Not just the number of PPD you are now smoking)   Years smoking 42 x Packs per day 3/4 = Pack years 31.5   (at least 20 pack yrs)   (Make sure they understand that we need to know how much they have smoked in the past, not just the number of PPD they are smoking now)  Do you have a personal history of cancer?  No    Do you have a family history of cancer? No  Are you coughing up blood?  No  Have you had unexplained weight loss of 15 lbs or more in the last 6 months? No  It looks like you meet all criteria.  When would be a good time for us  to schedule you for this screening?   Additional information: N/A

## 2023-11-16 ENCOUNTER — Other Ambulatory Visit

## 2023-11-16 DIAGNOSIS — E039 Hypothyroidism, unspecified: Secondary | ICD-10-CM | POA: Diagnosis not present

## 2023-11-16 DIAGNOSIS — I10 Essential (primary) hypertension: Secondary | ICD-10-CM

## 2023-11-16 DIAGNOSIS — E66812 Obesity, class 2: Secondary | ICD-10-CM | POA: Diagnosis not present

## 2023-11-16 DIAGNOSIS — Z6835 Body mass index (BMI) 35.0-35.9, adult: Secondary | ICD-10-CM | POA: Diagnosis not present

## 2023-11-16 LAB — CBC WITH DIFFERENTIAL/PLATELET
Absolute Lymphocytes: 1967 {cells}/uL (ref 850–3900)
Absolute Monocytes: 490 {cells}/uL (ref 200–950)
Basophils Absolute: 100 {cells}/uL (ref 0–200)
Basophils Relative: 1.2 %
Eosinophils Absolute: 66 {cells}/uL (ref 15–500)
Eosinophils Relative: 0.8 %
HCT: 35.2 % (ref 35.0–45.0)
Hemoglobin: 11 g/dL — ABNORMAL LOW (ref 11.7–15.5)
MCH: 27.4 pg (ref 27.0–33.0)
MCHC: 31.3 g/dL — ABNORMAL LOW (ref 32.0–36.0)
MCV: 87.8 fL (ref 80.0–100.0)
MPV: 9.9 fL (ref 7.5–12.5)
Monocytes Relative: 5.9 %
Neutro Abs: 5677 {cells}/uL (ref 1500–7800)
Neutrophils Relative %: 68.4 %
Platelets: 305 10*3/uL (ref 140–400)
RBC: 4.01 10*6/uL (ref 3.80–5.10)
RDW: 14.4 % (ref 11.0–15.0)
Total Lymphocyte: 23.7 %
WBC: 8.3 10*3/uL (ref 3.8–10.8)

## 2023-11-17 ENCOUNTER — Ambulatory Visit: Payer: Self-pay | Admitting: Family Medicine

## 2023-11-22 ENCOUNTER — Ambulatory Visit: Admitting: Physician Assistant

## 2023-11-22 ENCOUNTER — Encounter: Payer: Self-pay | Admitting: Physician Assistant

## 2023-11-22 DIAGNOSIS — F17210 Nicotine dependence, cigarettes, uncomplicated: Secondary | ICD-10-CM | POA: Diagnosis not present

## 2023-11-22 NOTE — Progress Notes (Signed)
 Virtual Visit via Telephone Note  I connected with [name] on 11/22/23 at  11:42 AM by telephone and verified that I am speaking with the correct person using two identifiers.  Location: Patient: home Provider: working virtually from home   I discussed the limitations, risks, security and privacy concerns of performing an evaluation and management service by telephone and the availability of in person appointments. I also discussed with the patient that there may be a patient responsible charge related to this service. The patient expressed understanding and agreed to proceed.     Shared Decision Making Visit Lung Cancer Screening Program [phone])   Eligibility: Age 63 Pack Years Smoking History Calculation 31.5 (# packs/per year x # years smoked) Recent History of coughing up blood  No Unexplained weight loss? No ( >Than 15 pounds within the last 6 months ) Prior History Lung / other cancer No (Diagnosis within the last 5 years already requiring surveillance chest CT Scans). Smoking Status Current Smoker  Visit Components: Discussion included one or more decision making aids. Yes Discussion included risk/benefits of screening. Yes Discussion included potential follow up diagnostic testing for abnormal scans. Yes Discussion included meaning and risk of over diagnosis. Yes Discussion included meaning and risk of False Positives. Yes Discussion included meaning of total radiation exposure. Yes  Counseling Included: Importance of adherence to annual lung cancer LDCT screening. Yes Impact of comorbidities on ability to participate in the program. Yes Ability and willingness to under diagnostic treatment: Yes  Smoking Cessation Counseling: Current Smokers:  Discussed importance of smoking cessation. Yes Information about tobacco cessation classes and interventions provided to patient. Yes Symptomatic Patient. No Diagnosis Code: Tobacco Use Z72.0 Asymptomatic Patient  Yes  Counseling (Intermediate counseling: > three minutes counseling) H9563 Information about tobacco cessation classes and interventions provided to patient. Yes Written Order for Lung Cancer Screening with LDCT placed in Epic. Yes (CT Chest Lung Cancer Screening Low Dose W/O CM) PFH4422 Z12.2-Screening of respiratory organs Z87.891-Personal history of nicotine dependence   I have spent 25 minutes of face to face/ virtual visit  time with the patient discussing the risks and benefits of lung cancer screening. We discussed the above noted topics. We paused at intervals to allow for questions to be asked and answered to ensure understanding.We discussed that the single most powerful action that anyone can take to decrease their risk of developing lung cancer is to quit smoking.  We discussed options for tools to aid in quitting smoking including nicotine replacement therapy, non-nicotine medications, support groups, Quit Smart classes, and behavior modification. We discussed that often times setting smaller, more achievable goals, such as eliminating 1 cigarette a day for a week and then 2 cigarettes a day for a week can be helpful in slowly decreasing the number of cigarettes smoked. I provided  them  with smoking cessation  information  with contact information for community resources, classes, free nicotine replacement therapy, and access to mobile apps, text messaging, and on-line smoking cessation help. I have also provided  them  the office contact information in the event they have any questions. We discussed the time and location of the scan, and that either Karna Curly RN, Karna Doom, RN  or I will call / send a letter with the results within 24-72 hours of receiving them. The patient verbalized understanding of all of  the above and had no further questions upon leaving the office. They have my contact information in the event they have any further  questions.  I spent 3 minutes counseling  on smoking cessation and the health risks of continued tobacco abuse.  I explained to the patient that there has been a high incidence of coronary artery disease noted on these exams. I explained that this is a non-gated exam therefore degree or severity cannot be determined. This patient is on statin therapy. I have asked the patient to follow-up with their PCP regarding any incidental finding of coronary artery disease and management with diet or medication as their PCP  feels is clinically indicated. The patient verbalized understanding of the above and had no further questions upon completion of the visit.    Leita SAUNDERS Ruta Capece, PA-C

## 2023-11-22 NOTE — Patient Instructions (Signed)

## 2023-12-06 ENCOUNTER — Ambulatory Visit (HOSPITAL_COMMUNITY): Admission: RE | Admit: 2023-12-06 | Source: Ambulatory Visit

## 2023-12-28 ENCOUNTER — Other Ambulatory Visit (HOSPITAL_COMMUNITY): Payer: Self-pay

## 2023-12-29 ENCOUNTER — Ambulatory Visit

## 2024-01-09 ENCOUNTER — Telehealth (HOSPITAL_COMMUNITY): Payer: Self-pay | Admitting: Pharmacist

## 2024-01-09 ENCOUNTER — Other Ambulatory Visit (HOSPITAL_COMMUNITY): Payer: Self-pay

## 2024-01-09 ENCOUNTER — Telehealth: Payer: Self-pay | Admitting: Pharmacist

## 2024-01-09 MED FILL — Rosuvastatin Calcium Tab 20 MG: ORAL | 90 days supply | Qty: 90 | Fill #1 | Status: AC

## 2024-01-09 NOTE — Telephone Encounter (Signed)
 error

## 2024-01-09 NOTE — Progress Notes (Signed)
 Pharmacy Quality Measure Review  This patient is appearing on a report for being at risk of failing the adherence measure for cholesterol (statin) medications this calendar year.   Medication: rosuvastatin  20 mg daily  Last fill date: 3/17 for 90 day supply  Left voicemail for patient to return my call at their convenience. Will collaborate with pharmacy team to prepare order.   Catie IVAR Centers, PharmD, [facility] Clinical Pharmacist [phone]

## 2024-01-16 ENCOUNTER — Other Ambulatory Visit (HOSPITAL_COMMUNITY): Payer: Self-pay

## 2024-01-16 ENCOUNTER — Other Ambulatory Visit: Payer: Self-pay | Admitting: Family Medicine

## 2024-01-16 NOTE — Telephone Encounter (Unsigned)
 Copied from CRM #[id number]. Topic: Clinical - Medication Refill >> Jan 16, 2024  1:41 PM Emylou G wrote: Medication: losartan  (COZAAR ) 100 MG tablet  Has the patient contacted their pharmacy? No (Agent: If no, request that the patient contact the pharmacy for the refill. If patient does not wish to contact the pharmacy document the reason why and proceed with request.) (Agent: If yes, when and what did the pharmacy advise?) said to call us   This is the patient's preferred pharmacy:   [facility] - [facility] [address] [place] [address] Phone: [phone] Fax: [phone]  Is this the correct pharmacy for this prescription? Yes If no, delete pharmacy and type the correct one.   Has the prescription been filled recently? No  Is the patient out of the medication? Yes  Has the patient been seen for an appointment in the last year OR does the patient have an upcoming appointment? Yes  Can we respond through MyChart? Yes  Agent: Please be advised that Rx refills may take up to 3 business days. We ask that you follow-up with your pharmacy.

## 2024-01-18 ENCOUNTER — Other Ambulatory Visit (HOSPITAL_COMMUNITY): Payer: Self-pay

## 2024-01-18 MED ORDER — LOSARTAN POTASSIUM 100 MG PO TABS
100.0000 mg | ORAL_TABLET | Freq: Every day | ORAL | 0 refills | Status: DC
  Filled 2024-01-18: qty 90, 90d supply, fill #0

## 2024-01-18 NOTE — Telephone Encounter (Signed)
 Requested Prescriptions  Pending Prescriptions Disp Refills   losartan  (COZAAR ) 100 MG tablet 90 tablet 0    Sig: Take 1 tablet (100 mg total) by mouth daily.     Cardiovascular:  Angiotensin Receptor Blockers Passed - 01/18/2024 12:26 PM      Passed - Cr in normal range and within 180 days    Creat  Date Value Ref Range Status  10/12/2023 0.62 0.50 - 1.05 mg/dL Final   Creatinine,U  Date Value Ref Range Status  09/12/2007   Final   28.8 (NOTE)  Cutoff Values for Urine Drug Screen:        Drug Class           Cutoff (ng/mL)        Amphetamines            1000        Barbiturates             200        Cocaine Metabolites      300        Benzodiazepines          200        Methadone                 300        Opiates                 2000        Phencyclidine             25        Propoxyphene             300        Marijuana Metabolites     50  For medical purposes only.   Creatinine, Urine  Date Value Ref Range Status  04/13/2023 5 (L) 20 - 275 mg/dL Final         Passed - K in normal range and within 180 days    Potassium  Date Value Ref Range Status  10/12/2023 4.6 3.5 - 5.3 mmol/L Final         Passed - Patient is not pregnant      Passed - Last BP in normal range    BP Readings from Last 1 Encounters:  10/20/23 120/62         Passed - Valid encounter within last 6 months    Recent Outpatient Visits           3 months ago Controlled type 2 diabetes mellitus with complication, without long-term current use of insulin (HCC)   [place] Duanne Butler DASEN, MD   9 months ago Controlled type 2 diabetes mellitus with complication, without long-term current use of insulin [facility])   [place] [facility] Duanne Butler DASEN, MD   1 year ago Controlled type 2 diabetes mellitus with complication, without long-term current use of insulin [facility])   [place] [facility] Pick[place], Butler DASEN, MD   2 years ago Essential  hypertension   [facility] Pickard, Butler DASEN, MD

## 2024-02-04 ENCOUNTER — Other Ambulatory Visit: Payer: Self-pay | Admitting: Family Medicine

## 2024-02-04 MED FILL — Nitroglycerin SL Tab 0.4 MG: SUBLINGUAL | 30 days supply | Qty: 100 | Fill #1 | Status: AC

## 2024-02-06 ENCOUNTER — Other Ambulatory Visit: Payer: Self-pay

## 2024-02-06 ENCOUNTER — Other Ambulatory Visit: Payer: Self-pay | Admitting: Family Medicine

## 2024-02-06 ENCOUNTER — Other Ambulatory Visit (HOSPITAL_COMMUNITY): Payer: Self-pay | Admitting: Family Medicine

## 2024-02-06 ENCOUNTER — Other Ambulatory Visit (HOSPITAL_COMMUNITY): Payer: Self-pay

## 2024-02-06 DIAGNOSIS — Z1231 Encounter for screening mammogram for malignant neoplasm of breast: Secondary | ICD-10-CM

## 2024-02-06 MED ORDER — CARVEDILOL 25 MG PO TABS
25.0000 mg | ORAL_TABLET | Freq: Two times a day (BID) | ORAL | 0 refills | Status: DC
Start: 2024-02-06 — End: 2024-04-19
  Filled 2024-02-06: qty 180, 90d supply, fill #0

## 2024-02-06 NOTE — Telephone Encounter (Unsigned)
 Copied from CRM [id number]. Topic: Clinical - Medication Refill >> Feb 06, 2024  9:41 AM Revonda D wrote: Medication: metFORMIN  (GLUCOPHAGE ) 1000 MG tablet  Has the patient contacted their pharmacy? Yes (Agent: If no, request that the patient contact the pharmacy for the refill. If patient does not wish to contact the pharmacy document the reason why and proceed with request.) (Agent: If yes, when and what did the pharmacy advise?)  This is the patient's preferred pharmacy:    - [facility] [address] [place] [address] Phone: [phone] Fax: [phone]  Is this the correct pharmacy for this prescription? Yes If no, delete pharmacy and type the correct one.   Has the prescription been filled recently? No  Is the patient out of the medication? No  Has the patient been seen for an appointment in the last year OR does the patient have an upcoming appointment? Yes  Can we respond through MyChart? Yes  Agent: Please be advised that Rx refills may take up to 3 business days. We ask that you follow-up with your pharmacy.

## 2024-02-06 NOTE — Telephone Encounter (Signed)
 Refused Losartan  refill - too soon

## 2024-02-06 NOTE — Telephone Encounter (Signed)
 Requested Prescriptions  Pending Prescriptions Disp Refills   carvedilol  (COREG ) 25 MG tablet 180 tablet 0    Sig: Take 1 tablet (25 mg total) by mouth 2 (two) times daily with a meal.     Cardiovascular: Beta Blockers 3 Passed - 02/06/2024 11:10 AM      Passed - Cr in normal range and within 360 days    Creat  Date Value Ref Range Status  10/12/2023 0.62 0.50 - 1.05 mg/dL Final   Creatinine,U  Date Value Ref Range Status  09/12/2007   Final   28.8 (NOTE)  Cutoff Values for Urine Drug Screen:        Drug Class           Cutoff (ng/mL)        Amphetamines            1000        Barbiturates             200        Cocaine Metabolites      300        Benzodiazepines          200        Methadone                 300        Opiates                 2000        Phencyclidine             25        Propoxyphene             300        Marijuana Metabolites     50  For medical purposes only.   Creatinine, Urine  Date Value Ref Range Status  04/13/2023 5 (L) 20 - 275 mg/dL Final         Passed - AST in normal range and within 360 days    AST  Date Value Ref Range Status  10/12/2023 17 10 - 35 U/L Final         Passed - ALT in normal range and within 360 days    ALT  Date Value Ref Range Status  10/12/2023 12 6 - 29 U/L Final         Passed - Last BP in normal range    BP Readings from Last 1 Encounters:  10/20/23 120/62         Passed - Last Heart Rate in normal range    Pulse Readings from Last 1 Encounters:  10/20/23 (!) 52         Passed - Valid encounter within last 6 months    Recent Outpatient Visits           3 months ago Controlled type 2 diabetes mellitus with complication, without long-term current use of insulin (HCC)   [place] [facility] Medicine Duanne Butler DASEN, MD   9 months ago Controlled type 2 diabetes mellitus with complication, without long-term current use of insulin [facility])   [place] [facility] Duanne Butler DASEN, MD   1  year ago Controlled type 2 diabetes mellitus with complication, without long-term current use of insulin [facility])   [place] [facility] [place], Butler DASEN, MD   2 years ago Essential hypertension   [place] [facility] [place],  Butler DASEN, MD               losartan  (COZAAR ) 100 MG tablet 90 tablet 0    Sig: Take 1 tablet (100 mg total) by mouth daily.     Cardiovascular:  Angiotensin Receptor Blockers Passed - 02/06/2024 11:10 AM      Passed - Cr in normal range and within 180 days    Creat  Date Value Ref Range Status  10/12/2023 0.62 0.50 - 1.05 mg/dL Final   Creatinine,U  Date Value Ref Range Status  09/12/2007   Final   28.8 (NOTE)  Cutoff Values for Urine Drug Screen:        Drug Class           Cutoff (ng/mL)        Amphetamines            1000        Barbiturates             200        Cocaine Metabolites      300        Benzodiazepines          200        Methadone                 300        Opiates                 2000        Phencyclidine             25        Propoxyphene             300        Marijuana Metabolites     50  For medical purposes only.   Creatinine, Urine  Date Value Ref Range Status  04/13/2023 5 (L) 20 - 275 mg/dL Final         Passed - K in normal range and within 180 days    Potassium  Date Value Ref Range Status  10/12/2023 4.6 3.5 - 5.3 mmol/L Final         Passed - Patient is not pregnant      Passed - Last BP in normal range    BP Readings from Last 1 Encounters:  10/20/23 120/62         Passed - Valid encounter within last 6 months    Recent Outpatient Visits           3 months ago Controlled type 2 diabetes mellitus with complication, without long-term current use of insulin (HCC)   [place] [facility] Medicine Duanne Butler DASEN, MD   9 months ago Controlled type 2 diabetes mellitus with complication, without long-term current use of insulin [facility])   [employer] [facility]  Medicine Duanne Butler DASEN, MD   1 year ago Controlled type 2 diabetes mellitus with complication, without long-term current use of insulin [facility])   [place] [facility] Pickard, Butler DASEN, MD   2 years ago Essential hypertension   [place] [facility] Pickard, Butler DASEN, MD

## 2024-02-07 ENCOUNTER — Other Ambulatory Visit: Payer: Self-pay

## 2024-02-07 ENCOUNTER — Other Ambulatory Visit (HOSPITAL_COMMUNITY): Payer: Self-pay

## 2024-02-07 MED ORDER — METFORMIN HCL 1000 MG PO TABS
1000.0000 mg | ORAL_TABLET | Freq: Two times a day (BID) | ORAL | 0 refills | Status: DC
  Filled 2024-02-07: qty 180, 90d supply, fill #0

## 2024-02-07 NOTE — Telephone Encounter (Signed)
 Requested Prescriptions  Pending Prescriptions Disp Refills   metFORMIN  (GLUCOPHAGE ) 1000 MG tablet 180 tablet 0    Sig: Take 1 tablet (1,000 mg total) by mouth 2 (two) times daily with a meal.     Endocrinology:  Diabetes - Biguanides Passed - 02/07/2024 12:48 PM      Passed - Cr in normal range and within 360 days    Creat  Date Value Ref Range Status  10/12/2023 0.62 0.50 - 1.05 mg/dL Final   Creatinine,U  Date Value Ref Range Status  09/12/2007   Final   28.8 (NOTE)  Cutoff Values for Urine Drug Screen:        Drug Class           Cutoff (ng/mL)        Amphetamines            1000        Barbiturates             200        Cocaine Metabolites      300        Benzodiazepines          200        Methadone                 300        Opiates                 2000        Phencyclidine             25        Propoxyphene             300        Marijuana Metabolites     50  For medical purposes only.   Creatinine, Urine  Date Value Ref Range Status  04/13/2023 5 (L) 20 - 275 mg/dL Final         Passed - HBA1C is between 0 and 7.9 and within 180 days    Hgb A1c MFr Bld  Date Value Ref Range Status  10/12/2023 6.1 (H) <5.7 % Final    Comment:    For someone without known diabetes, a hemoglobin  A1c value between 5.7% and 6.4% is consistent with prediabetes and should be confirmed with a  follow-up test. . For someone with known diabetes, a value <7% indicates that their diabetes is well controlled. A1c targets should be individualized based on duration of diabetes, age, comorbid conditions, and other considerations. . This assay result is consistent with an increased risk of diabetes. . Currently, no consensus exists regarding use of hemoglobin A1c for diagnosis of diabetes for children. .          Passed - eGFR in normal range and within 360 days    GFR, Est African American  Date Value Ref Range Status  09/17/2020 114 > OR = 60 mL/min/1.24m2 Final   GFR, Est Non African  American  Date Value Ref Range Status  09/17/2020 98 > OR = 60 mL/min/1.54m2 Final   eGFR  Date Value Ref Range Status  04/13/2023 101 > OR = 60 mL/min/1.66m2 Final         Passed - B12 Level in normal range and within 720 days    Vitamin B-12  Date Value Ref Range Status  10/12/2023 700 200 - 1,100 pg/mL Final         Passed - Valid encounter  within last 6 months    Recent Outpatient Visits           3 months ago Controlled type 2 diabetes mellitus with complication, without long-term current use of insulin (HCC)   [place] [facility] Pickard, Butler DASEN, MD   9 months ago Controlled type 2 diabetes mellitus with complication, without long-term current use of insulin (HCC)   [place] [facility] Pickard, Butler DASEN, MD   1 year ago Controlled type 2 diabetes mellitus with complication, without long-term current use of insulin (HCC)   [place] [facility] Medicine Duanne Butler DASEN, MD   2 years ago Essential hypertension   [place] [facility] Duanne Butler DASEN, MD       Future Appointments             In 2 months Strader, Laymon HERO, PA-C [facility] [place] [facility]            Passed - CBC within normal limits and completed in the last 12 months    WBC  Date Value Ref Range Status  11/16/2023 8.3 3.8 - 10.8 Thousand/uL Final   RBC  Date Value Ref Range Status  11/16/2023 4.01 3.80 - 5.10 Million/uL Final   Hemoglobin  Date Value Ref Range Status  11/16/2023 11.0 (L) 11.7 - 15.5 g/dL Final   HCT  Date Value Ref Range Status  11/16/2023 35.2 35.0 - 45.0 % Final   MCHC  Date Value Ref Range Status  11/16/2023 31.3 (L) 32.0 - 36.0 g/dL Final    Comment:    For adults, a slight decrease in the calculated MCHC value (in the range of 30 to 32 g/dL) is most likely not clinically significant; however, it should be interpreted with caution in correlation with other red  cell parameters and the patient's clinical condition.    [facility]  Date Value Ref Range Status  11/16/2023 27.4 27.0 - 33.0 pg Final   MCV  Date Value Ref Range Status  11/16/2023 87.8 80.0 - 100.0 fL Final   No results found for: PLTCOUNTKUC, LABPLAT, POCPLA RDW  Date Value Ref Range Status  11/16/2023 14.4 11.0 - 15.0 % Final

## 2024-02-09 ENCOUNTER — Telehealth: Payer: Self-pay

## 2024-02-09 ENCOUNTER — Other Ambulatory Visit (HOSPITAL_COMMUNITY): Payer: Self-pay

## 2024-02-09 NOTE — Progress Notes (Signed)
   02/09/2024  Patient ID: [name], female   DOB: [birth year], 63 y.o.   MRN: [id number]  This patient is appearing on a report for being at risk of failing the adherence measure for identified medications this calendar year.   Medication Adherence Summary (STAR/HEDIS Monitoring): Adherence Category: cholesterol (statin)    Drug Name: ROSUVASTATIN  CALCIUM  20 MG TAB Sold Date:01/10/24 Days' Supply: 90   Drug Name: Losartan  100 mg  Sold Date:01/18/2024 Days' Supply: 90 -- Not on the report      Notes: ? Adherence data not pulled from pharmacy claims portal Dr. Annemarie. I called [facility]   Dorcas Solian, PharmD Clinical Pharmacist Cell: [phone]

## 2024-02-10 ENCOUNTER — Other Ambulatory Visit: Payer: Self-pay | Admitting: Family Medicine

## 2024-02-10 ENCOUNTER — Other Ambulatory Visit (HOSPITAL_COMMUNITY): Payer: Self-pay

## 2024-02-10 MED ORDER — AMLODIPINE BESYLATE 10 MG PO TABS
10.0000 mg | ORAL_TABLET | Freq: Every day | ORAL | 0 refills | Status: DC
  Filled 2024-02-10: qty 90, 90d supply, fill #0

## 2024-02-10 MED ORDER — FUROSEMIDE 20 MG PO TABS
20.0000 mg | ORAL_TABLET | Freq: Every day | ORAL | 0 refills | Status: DC
  Filled 2024-02-10: qty 90, 90d supply, fill #0

## 2024-02-10 MED ORDER — OMEPRAZOLE 40 MG PO CPDR
40.0000 mg | DELAYED_RELEASE_CAPSULE | Freq: Every day | ORAL | 0 refills | Status: DC
  Filled 2024-02-10: qty 90, 90d supply, fill #0

## 2024-02-10 NOTE — Telephone Encounter (Signed)
 Requested Prescriptions  Pending Prescriptions Disp Refills   furosemide  (LASIX ) 20 MG tablet 90 tablet 0    Sig: Take 1 tablet (20 mg total) by mouth daily.     Cardiovascular:  Diuretics - Loop Failed - 02/10/2024  4:42 PM      Failed - Mg Level in normal range and within 180 days    Magnesium  Date Value Ref Range Status  09/19/2007 2.1  Final         Passed - K in normal range and within 180 days    Potassium  Date Value Ref Range Status  10/12/2023 4.6 3.5 - 5.3 mmol/L Final         Passed - Ca in normal range and within 180 days    Calcium   Date Value Ref Range Status  10/12/2023 9.6 8.6 - 10.4 mg/dL Final         Passed - Na in normal range and within 180 days    Sodium  Date Value Ref Range Status  10/12/2023 136 135 - 146 mmol/L Final         Passed - Cr in normal range and within 180 days    Creat  Date Value Ref Range Status  10/12/2023 0.62 0.50 - 1.05 mg/dL Final   Creatinine,U  Date Value Ref Range Status  09/12/2007   Final   28.8 (NOTE)  Cutoff Values for Urine Drug Screen:        Drug Class           Cutoff (ng/mL)        Amphetamines            1000        Barbiturates             200        Cocaine Metabolites      300        Benzodiazepines          200        Methadone                 300        Opiates                 2000        Phencyclidine             25        Propoxyphene             300        Marijuana Metabolites     50  For medical purposes only.   Creatinine, Urine  Date Value Ref Range Status  04/13/2023 5 (L) 20 - 275 mg/dL Final         Passed - Cl in normal range and within 180 days    Chloride  Date Value Ref Range Status  10/12/2023 100 98 - 110 mmol/L Final         Passed - Last BP in normal range    BP Readings from Last 1 Encounters:  10/20/23 120/62         Passed - Valid encounter within last 6 months    Recent Outpatient Visits           3 months ago Controlled type 2 diabetes mellitus with complication,  without long-term current use of insulin [facility])   [place] [facility] Pickard, Butler DASEN, MD   9 months ago  Controlled type 2 diabetes mellitus with complication, without long-term current use of insulin (HCC)   [place] [facility] Medicine Duanne Butler DASEN, MD   1 year ago Controlled type 2 diabetes mellitus with complication, without long-term current use of insulin [facility])   [place] [facility] Medicine Pickard, Butler DASEN, MD   2 years ago Essential hypertension   [place] [facility] Duanne Butler DASEN, MD       Future Appointments             In 2 months Strader, Laymon HERO, PA-C [place] [facility] [place] [facility]             omeprazole  (PRILOSEC) 40 MG capsule 90 capsule 0    Sig: Take 1 capsule (40 mg total) by mouth daily.     Gastroenterology: Proton Pump Inhibitors Passed - 02/10/2024  4:42 PM      Passed - Valid encounter within last 12 months    Recent Outpatient Visits           3 months ago Controlled type 2 diabetes mellitus with complication, without long-term current use of insulin (HCC)   [place] [facility] Duanne Butler DASEN, MD   9 months ago Controlled type 2 diabetes mellitus with complication, without long-term current use of insulin [facility])   [place] [facility] Duanne Butler DASEN, MD   1 year ago Controlled type 2 diabetes mellitus with complication, without long-term current use of insulin [facility])   [place] [facility] Medicine Duanne Butler DASEN, MD   2 years ago Essential hypertension   [place] [facility] Family Medicine Pickard, Butler DASEN, MD       Future Appointments             In 2 months Strader, Laymon HERO, PA-C [place] [facility] [place] [facility]

## 2024-02-10 NOTE — Telephone Encounter (Signed)
 Copied from CRM [id number]. Topic: Clinical - Medication Refill >> Feb 10, 2024  9:41 AM Avram MATSU wrote: Medication: furosemide  (LASIX ) 20 MG tablet [[id number]] omeprazole  (PRILOSEC) 40 MG capsule [[id number]]   Has the patient contacted their pharmacy? Yes (Agent: If no, request that the patient contact the pharmacy for the refill. If patient does not wish to contact the pharmacy document the reason why and proceed with request.) (Agent: If yes, when and what did the pharmacy advise?)  This is the patient's preferred pharmacy: Mail order  [facility] [address] [place] [address] Phone: [phone] Fax: [phone]  Is this the correct pharmacy for this prescription? Yes If no, delete pharmacy and type the correct one.   Has the prescription been filled recently? No  Is the patient out of the medication? No  Has the patient been seen for an appointment in the last year OR does the patient have an upcoming appointment? Yes  Can we respond through MyChart? Yes  Agent: Please be advised that Rx refills may take up to 3 business days. We ask that you follow-up with your pharmacy.

## 2024-02-10 NOTE — Telephone Encounter (Signed)
 Requested Prescriptions  Pending Prescriptions Disp Refills   amLODipine  (NORVASC ) 10 MG tablet 90 tablet 0    Sig: Take 1 tablet (10 mg total) by mouth daily.     Cardiovascular: Calcium  Channel Blockers 2 Passed - 02/10/2024  4:36 PM      Passed - Last BP in normal range    BP Readings from Last 1 Encounters:  10/20/23 120/62         Passed - Last Heart Rate in normal range    Pulse Readings from Last 1 Encounters:  10/20/23 (!) 52         Passed - Valid encounter within last 6 months    Recent Outpatient Visits           3 months ago Controlled type 2 diabetes mellitus with complication, without long-term current use of insulin (HCC)   [place] [facility] Duanne Butler DASEN, MD   9 months ago Controlled type 2 diabetes mellitus with complication, without long-term current use of insulin [facility])   [place] [facility] Duanne Butler DASEN, MD   1 year ago Controlled type 2 diabetes mellitus with complication, without long-term current use of insulin [facility])   [place] [facility] Pickard, Butler DASEN, MD   2 years ago Essential hypertension   [place] [facility] Medicine Pickard, Butler DASEN, MD       Future Appointments             In 2 months Strader, Laymon HERO, PA-C [place] [facility] [place] [facility]

## 2024-02-15 ENCOUNTER — Ambulatory Visit

## 2024-02-15 VITALS — Ht 66.0 in | Wt 170.0 lb

## 2024-02-15 DIAGNOSIS — Z0000 Encounter for general adult medical examination without abnormal findings: Secondary | ICD-10-CM | POA: Diagnosis not present

## 2024-02-15 NOTE — Patient Instructions (Signed)
 [name],  Thank you for taking the time for your Medicare Wellness Visit. I appreciate your continued commitment to your health goals. Please review the care plan we discussed, and feel free to reach out if I can assist you further.  Medicare recommends these wellness visits once per year to help you and your care team stay ahead of potential health issues. These visits are designed to focus on prevention, allowing your provider to concentrate on managing your acute and chronic conditions during your regular appointments.  Please note that Annual Wellness Visits do not include a physical exam. Some assessments may be limited, especially if the visit was conducted virtually. If needed, we may recommend a separate in-person follow-up with your provider.  Ongoing Care Seeing your primary care provider every 3 to 6 months helps us  monitor your health and provide consistent, personalized care.   Referrals If a referral was made during today's visit and you haven't received any updates within two weeks, please contact the referred provider directly to check on the status.  Recommended Screenings:  Health Maintenance  Topic Date Due   Pneumococcal Vaccine for age over 34 (2 of 2 - PCV) 09/06/2018   Complete foot exam   03/07/2020   Flu Shot  12/30/2023   Yearly kidney health urinalysis for diabetes  04/12/2024   Hemoglobin A1C  04/13/2024   Eye exam for diabetics  04/18/2024   Yearly kidney function blood test for diabetes  10/11/2024   Medicare Annual Wellness Visit  02/14/2025   Breast Cancer Screening  03/06/2025   Colon Cancer Screening  12/08/2025   Pap with HPV screening  05/28/2026   DTaP/Tdap/Td vaccine (4 - Td or Tdap) 09/27/2030   COVID-19 Vaccine  Completed   Hepatitis C Screening  Completed   HIV Screening  Completed   Zoster (Shingles) Vaccine  Completed   Hepatitis B Vaccine  Aged Out   HPV Vaccine  Aged Out   Meningitis B Vaccine  Aged Out       02/15/2024    2:20  PM  Advanced Directives  Does Patient Have a Medical Advance Directive? No  Would patient like information on creating a medical advance directive? Yes (MAU/Ambulatory/Procedural Areas - Information given)   Advance Care Planning is important because it: Ensures you receive medical care that aligns with your values, goals, and preferences. Provides guidance to your family and loved ones, reducing the emotional burden of decision-making during critical moments.  Information on Advanced Care Planning can be found at [place]  Secretary of [facility] Advance Health Care Directives Advance Health Care Directives ([email or web address]   Vision: Annual vision screenings are recommended for early detection of glaucoma, cataracts, and diabetic retinopathy. These exams can also reveal signs of chronic conditions such as diabetes and high blood pressure.  Dental: Annual dental screenings help detect early signs of oral cancer, gum disease, and other conditions linked to overall health, including heart disease and diabetes.  Please see the attached documents for additional preventive care recommendations.

## 2024-02-15 NOTE — Progress Notes (Signed)
 Subjective:   [name] is a 63 y.o. who presents for a Medicare Wellness preventive visit.  As a reminder, Annual Wellness Visits don't include a physical exam, and some assessments may be limited, especially if this visit is performed virtually. We may recommend an in-person follow-up visit with your provider if needed.  Visit Complete: Virtual I connected with  [name] on 02/15/24 by a audio enabled telemedicine application and verified that I am speaking with the correct person using two identifiers.  Patient Location: Home  Provider Location: Home Office  I discussed the limitations of evaluation and management by telemedicine. The patient expressed understanding and agreed to proceed.  Vital Signs: Because this visit was a virtual/telehealth visit, some criteria may be missing or patient reported. Any vitals not documented were not able to be obtained and vitals that have been documented are patient reported.  VideoDeclined- This patient declined Librarian, academic. Therefore the visit was completed with audio only.  Persons Participating in Visit: Patient.  AWV Questionnaire: No: Patient Medicare AWV questionnaire was not completed prior to this visit.  Cardiac Risk Factors include: diabetes mellitus;dyslipidemia;hypertension     Objective:    Today's Vitals   02/15/24 1416  Weight: 170 lb (77.1 kg)  Height: 5' 6 (1.676 m)   Body mass index is 27.44 kg/m.     02/15/2024    2:20 PM 05/05/2022    2:09 PM 04/30/2021   11:59 AM 09/26/2020    9:06 AM 01/04/2018    9:17 AM  Advanced Directives  Does Patient Have a Medical Advance Directive? No No No No No   Would patient like information on creating a medical advance directive? Yes (MAU/Ambulatory/Procedural Areas - Information given) No - Patient declined No - Patient declined No - Patient declined Yes (ED - Information included in AVS)      Data saved with a previous flowsheet  row definition    Current Medications (verified) Outpatient Encounter Medications as of 02/15/2024  Medication Sig   Accu-Chek Softclix Lancets lancets USE AS INSTRUCTED TO TEST BLOOD SUGAR   Alcohol  Swabs  (DROPSAFE ALCOHOL  PREP) 70 % PADS USE AS DIRECTED   amLODipine  (NORVASC ) 10 MG tablet Take 1 tablet (10 mg total) by mouth daily.   aspirin  EC 81 MG tablet Take 1 tablet (81 mg total) by mouth daily. Swallow whole.   carvedilol  (COREG ) 25 MG tablet Take 1 tablet (25 mg total) by mouth 2 (two) times daily with a meal.   Cyanocobalamin  (VITAMIN B-12 CR PO) Take by mouth.   ezetimibe  (ZETIA ) 10 MG tablet Take 1 tablet (10 mg total) by mouth daily.   furosemide  (LASIX ) 20 MG tablet Take 1 tablet (20 mg total) by mouth daily.   levothyroxine  (SYNTHROID ) 100 MCG tablet Take 1 tablet (100 mcg total) by mouth daily.   losartan  (COZAAR ) 100 MG tablet Take 1 tablet (100 mg total) by mouth daily.   metFORMIN  (GLUCOPHAGE ) 1000 MG tablet Take 1 tablet (1,000 mg total) by mouth 2 (two) times daily with a meal.   nitroGLYCERIN  (NITROSTAT ) 0.4 MG SL tablet DISSOLVE 1 TABLET UNDER THE TONGUE EVERY 5 MINUTES AS NEEDED AS DIRECTED   Omega-3 Fatty Acids (FISH OIL) 1000 MG CAPS Take by mouth.   omeprazole  (PRILOSEC) 40 MG capsule Take 1 capsule (40 mg total) by mouth daily.   rosuvastatin  (CRESTOR ) 20 MG tablet Take 1 tablet (20 mg total) by mouth daily.   [DISCONTINUED] glucose blood (ACCU-CHEK AVIVA PLUS)  test strip USE AS INSTRUCTED TO CHECK BLOOD SUGAR   No facility-administered encounter medications on file as of 02/15/2024.    Allergies (verified) Patient has no known allergies.   History: Past Medical History:  Diagnosis Date   Anemia    Arthritis    knees   Blood transfusion 08/2007   CHF (congestive heart failure) (HCC)    Claudication (HCC)    Coronary artery disease    Diabetes mellitus    GERD (gastroesophageal reflux disease)    Hyperlipidemia    Hypertension    Hypothyroidism     PVD (peripheral vascular disease) (HCC)    PVD (peripheral vascular disease) (HCC)    Sleep apnea    does not wear CPAP   Tobacco abuse    Past Surgical History:  Procedure Laterality Date   COLONOSCOPY  08/2011   normal DP at [facility]   COLONOSCOPY WITH PROPOFOL   12/09/2022   [facility]   Family History  Problem Relation Age of Onset   Heart disease Mother    Stroke Mother    Heart disease Father    Heart disease Maternal Grandmother    Stroke Maternal Grandmother    Heart disease Maternal Grandfather    Kidney disease Paternal Grandmother    Heart disease Paternal Grandfather    Colon cancer Neg Hx    Esophageal cancer Neg Hx    Rectal cancer Neg Hx    Stomach cancer Neg Hx    Social History   Socioeconomic History   Marital status: Legally Separated    Spouse name: Not on file   Number of children: 0   Years of education: Not on file   Highest education level: Not on file  Occupational History   Not on file  Tobacco Use   Smoking status: Every Day    Current packs/day: 0.50    Average packs/day: 0.5 packs/day for 32.0 years (16.0 ttl pk-yrs)    Types: Cigarettes   Smokeless tobacco: Never  Vaping Use   Vaping status: Never Used  Substance and Sexual Activity   Alcohol  use: No   Drug use: No   Sexual activity: Yes    Birth control/protection: Post-menopausal  Other Topics Concern   Not on file  Social History Narrative   Not on file   Social Drivers of Health   Financial Resource Strain: Low Risk  (02/15/2024)   Overall Financial Resource Strain (CARDIA)    Difficulty of Paying Living Expenses: Not hard at all  Food Insecurity: Patient Declined (02/15/2024)   Hunger Vital Sign    Worried About Running Out of Food in the Last Year: Patient declined    Ran Out of Food in the Last Year: Patient declined  Transportation Needs: No Transportation Needs (02/15/2024)   PRAPARE - Administrator, Civil Service (Medical): No    Lack of Transportation  (Non-Medical): No  Physical Activity: Unknown (02/15/2024)   Exercise Vital Sign    Days of Exercise per Week: 3 days    Minutes of Exercise per Session: Not on file  Stress: No Stress Concern Present (02/15/2024)   [employer] of Occupational Health - Occupational Stress Questionnaire    Feeling of Stress: Only a little  Social Connections: Patient Declined (02/15/2024)   Social Connection and Isolation Panel    Frequency of Communication with Friends and Family: Patient declined    Frequency of Social Gatherings with Friends and Family: Patient declined    Attends Religious Services: Patient  declined    Active Member of Clubs or Organizations: Patient declined    Attends Banker Meetings: Patient declined    Marital Status: Patient declined    Tobacco Counseling Ready to quit: Not Answered Counseling given: Not Answered    Clinical Intake:  Pre-visit preparation completed: Yes  Pain : No/denies pain  Diabetes: Yes CBG done?: No Did pt. bring in CBG monitor from home?: No  Lab Results  Component Value Date   HGBA1C 6.1 (H) 10/12/2023   HGBA1C 5.8 (H) 04/13/2023   HGBA1C 6.2 (H) 09/08/2022     How often do you need to have someone help you when you read instructions, pamphlets, or other written materials from your doctor or pharmacy?: 1 - Never  Interpreter Needed?: No  Information entered by :: Charmaine Bloodgood LPN   Activities of Daily Living     02/15/2024    2:20 PM 04/14/2023    9:59 AM  In your present state of health, do you have any difficulty performing the following activities:  Hearing? 0 0  Vision? 0 0  Difficulty concentrating or making decisions? 0 0  Walking or climbing stairs? 0 0  Dressing or bathing? 0 0  Doing errands, shopping? 0 0  Preparing Food and eating ? N N  Using the Toilet? N N  In the past six months, have you accidently leaked urine? N N  Do you have problems with loss of bowel control? N N  Managing your  Medications? N N  Managing your Finances? N N  Housekeeping or managing your Housekeeping? N N    Patient Care Team: Duanne Butler DASEN, MD as PCP - General (Family Medicine) Alvan Dorn FALCON, MD as PCP - Cardiology (Cardiology)  I have updated your Care Teams any recent Medical Services you may have received from other providers in the past year.     Assessment:   This is a routine wellness examination for [facility].  Hearing/Vision screen Hearing Screening - Comments:: Denies hearing difficulties   Vision Screening - Comments:: No vision problems; in office diabetic retina scan performed     Goals Addressed             This Visit's Progress    Maintain health and independence   On track      Depression Screen     02/15/2024    2:19 PM 10/20/2023    9:16 AM 04/19/2023    9:44 AM 09/14/2022    8:55 AM 05/05/2022    2:07 PM 04/30/2021   11:56 AM 09/26/2020    9:05 AM  PHQ 2/9 Scores  PHQ - 2 Score 0 0 0 0 0 0 0    Fall Risk     02/15/2024    2:20 PM 10/20/2023    9:16 AM 04/19/2023    9:44 AM 04/14/2023    9:59 AM 09/14/2022    8:55 AM  Fall Risk   Falls in the past year? 0 0 0 0 0  Number falls in past yr: 0 0 0  0  Injury with Fall? 0 0 0  0  Risk for fall due to : No Fall Risks No Fall Risks   No Fall Risks  Follow up Falls prevention discussed;Education provided;Falls evaluation completed Falls evaluation completed   Falls prevention discussed    MEDICARE RISK AT HOME:  Medicare Risk at Home Any stairs in or around the home?: No If so, are there any without handrails?: No Home free  of loose throw rugs in walkways, pet beds, electrical cords, etc?: Yes Adequate lighting in your home to reduce risk of falls?: Yes Life alert?: No Use of a cane, walker or w/c?: No Grab bars in the bathroom?: Yes Shower chair or bench in shower?: No Elevated toilet seat or a handicapped toilet?: No  TIMED UP AND GO:  Was the test performed?  No  Cognitive Function: 6CIT  completed        02/15/2024    2:20 PM 05/05/2022    2:12 PM 04/30/2021   12:02 PM  6CIT Screen  What Year? 0 points 0 points 0 points  What month? 0 points 0 points 0 points  What time? 0 points 0 points 0 points  Count back from 20 0 points 0 points 0 points  Months in reverse 0 points 0 points 0 points  Repeat phrase 0 points 0 points 2 points  Total Score 0 points 0 points 2 points    Immunizations Immunization History  Administered Date(s) Administered   H1N1 03/29/2008   INFLUENZA, HIGH DOSE SEASONAL PF 03/07/2018   Influenza,inj,Quad PF,6+ Mos 02/20/2013, 05/09/2014, 03/04/2015, 03/26/2016, 03/03/2017, 03/08/2019   Influenza-Unspecified 04/16/2010, 02/02/2011, 02/22/2012, 03/07/2018, 03/11/2021, 02/17/2022, 02/01/2023   Moderna Covid-19 Fall Seasonal Vaccine 28yrs & older 02/01/2023   PFIZER(Purple Top)SARS-COV-2 Vaccination 10/11/2019, 11/02/2019, 05/19/2020   Pfizer(Comirnaty)Fall Seasonal Vaccine 12 years and older 02/25/2022   Pneumococcal Polysaccharide-23 08/30/2007, 09/05/2017   Respiratory Syncytial Virus Vaccine,Recomb Aduvanted(Arexvy) 02/17/2022   Td 05/03/2011   Tdap 05/03/2011, 09/26/2020   Zoster Recombinant(Shingrix) 02/12/2020, 01/12/2022    Screening Tests Health Maintenance  Topic Date Due   Pneumococcal Vaccine: 50+ Years (2 of 2 - PCV) 09/06/2018   FOOT EXAM  03/07/2020   Influenza Vaccine  12/30/2023   Diabetic kidney evaluation - Urine ACR  04/12/2024   HEMOGLOBIN A1C  04/13/2024   OPHTHALMOLOGY EXAM  04/18/2024   Diabetic kidney evaluation - eGFR measurement  10/11/2024   Medicare Annual Wellness (AWV)  02/14/2025   Mammogram  03/06/2025   Colonoscopy  12/08/2025   Cervical Cancer Screening (HPV/Pap Cotest)  05/28/2026   DTaP/Tdap/Td (4 - Td or Tdap) 09/27/2030   COVID-19 Vaccine  Completed   Hepatitis C Screening  Completed   HIV Screening  Completed   Zoster Vaccines- Shingrix  Completed   Hepatitis B Vaccines 19-59 Average Risk  Aged  Out   HPV VACCINES  Aged Out   Meningococcal B Vaccine  Aged Out    Health Maintenance Items Addressed: Information provided on vaccine recommendations   Additional Screening:  Vision Screening: Recommended annual ophthalmology exams for early detection of glaucoma and other disorders of the eye. Is the patient up to date with their annual eye exam?  Yes  Who is the provider or what is the name of the office in which the patient attends annual eye exams? none  Dental Screening: Recommended annual dental exams for proper oral hygiene  Community Resource Referral / Chronic Care Management: CRR required this visit?  No   CCM required this visit?  No   Plan:    I have personally reviewed and noted the following in the patient's chart:   Medical and social history Use of alcohol , tobacco or illicit drugs  Current medications and supplements including opioid prescriptions. Patient is not currently taking opioid prescriptions. Functional ability and status Nutritional status Physical activity Advanced directives List of other physicians Hospitalizations, surgeries, and ER visits in previous 12 months Vitals Screenings to include cognitive, depression,  and falls Referrals and appointments  In addition, I have reviewed and discussed with patient certain preventive protocols, quality metrics, and best practice recommendations. A written personalized care plan for preventive services as well as general preventive health recommendations were provided to patient.   Lavelle Pfeiffer [place], [place]   [id number]   After Visit Summary: (MyChart) Due to this being a telephonic visit, the after visit summary with patients personalized plan was offered to patient via MyChart   Notes: Nothing significant to report at this time.

## 2024-03-02 ENCOUNTER — Ambulatory Visit (HOSPITAL_COMMUNITY)
Admission: RE | Admit: 2024-03-02 | Discharge: 2024-03-02 | Disposition: A | Discharge status: Home / Self Care | Source: Ambulatory Visit | Attending: Acute Care | Admitting: Acute Care

## 2024-03-02 DIAGNOSIS — F17210 Nicotine dependence, cigarettes, uncomplicated: Secondary | ICD-10-CM | POA: Insufficient documentation

## 2024-03-02 DIAGNOSIS — Z87891 Personal history of nicotine dependence: Secondary | ICD-10-CM | POA: Insufficient documentation

## 2024-03-02 DIAGNOSIS — Z122 Encounter for screening for malignant neoplasm of respiratory organs: Secondary | ICD-10-CM | POA: Diagnosis not present

## 2024-03-06 ENCOUNTER — Other Ambulatory Visit: Payer: Self-pay

## 2024-03-06 ENCOUNTER — Ambulatory Visit: Payer: Self-pay | Admitting: Family Medicine

## 2024-03-06 DIAGNOSIS — F17210 Nicotine dependence, cigarettes, uncomplicated: Secondary | ICD-10-CM

## 2024-03-06 DIAGNOSIS — Z87891 Personal history of nicotine dependence: Secondary | ICD-10-CM

## 2024-03-06 DIAGNOSIS — Z122 Encounter for screening for malignant neoplasm of respiratory organs: Secondary | ICD-10-CM

## 2024-03-12 ENCOUNTER — Ambulatory Visit (HOSPITAL_COMMUNITY)
Admission: RE | Admit: 2024-03-12 | Discharge: 2024-03-12 | Disposition: A | Discharge status: Home / Self Care | Source: Ambulatory Visit | Attending: Family Medicine | Admitting: Family Medicine

## 2024-03-12 DIAGNOSIS — Z1231 Encounter for screening mammogram for malignant neoplasm of breast: Secondary | ICD-10-CM | POA: Diagnosis not present

## 2024-03-20 NOTE — Progress Notes (Signed)
   03/20/2024  Patient ID: [name], female   DOB: [birth year], 63 y.o.   MRN: [id number]  Pharmacy Quality Measure Review  This patient is appearing on a report for being at risk of failing the adherence measure for cholesterol (statin) and hypertension (ACEi/ARB) medications this calendar year.   Medication: rosuvastatin  and losartan  Last fill date: 01/10/2024 and 01/18/2024 for 90 day supplies  Insurance report was not up to date. No action needed at this time.   Lang Sieve, PharmD, BCGP Clinical Pharmacist  [phone]

## 2024-03-26 ENCOUNTER — Other Ambulatory Visit (HOSPITAL_BASED_OUTPATIENT_CLINIC_OR_DEPARTMENT_OTHER): Payer: Self-pay

## 2024-03-27 ENCOUNTER — Other Ambulatory Visit: Payer: Self-pay | Admitting: Family Medicine

## 2024-03-29 ENCOUNTER — Other Ambulatory Visit: Payer: Self-pay

## 2024-03-29 ENCOUNTER — Other Ambulatory Visit (HOSPITAL_COMMUNITY): Payer: Self-pay

## 2024-03-29 MED ORDER — AMLODIPINE BESYLATE 10 MG PO TABS
10.0000 mg | ORAL_TABLET | Freq: Every day | ORAL | 0 refills | Status: DC
  Filled 2024-03-29: qty 90, 90d supply, fill #0

## 2024-03-29 MED ORDER — LEVOTHYROXINE SODIUM 100 MCG PO TABS
100.0000 ug | ORAL_TABLET | Freq: Every day | ORAL | 1 refills | Status: DC
  Filled 2024-03-29: qty 90, 90d supply, fill #0

## 2024-03-29 MED ORDER — EZETIMIBE 10 MG PO TABS
10.0000 mg | ORAL_TABLET | Freq: Every day | ORAL | 1 refills | Status: DC
  Filled 2024-03-29: qty 90, 90d supply, fill #0

## 2024-03-29 MED ORDER — ACCU-CHEK SOFTCLIX LANCETS MISC
1 refills | Status: DC
  Filled 2024-03-29: qty 300, 90d supply, fill #0

## 2024-03-29 MED ORDER — DROPSAFE ALCOHOL PREP 70 % PADS
MEDICATED_PAD | 1 refills | Status: AC
  Filled 2024-03-29: qty 300, 90d supply, fill #0
  Filled 2024-06-23 – 2024-06-27 (×2): qty 300, 90d supply, fill #1

## 2024-03-29 NOTE — Telephone Encounter (Signed)
 Requested Prescriptions  Pending Prescriptions Disp Refills   Accu-Chek Softclix Lancets lancets 300 each 1    Sig: USE AS INSTRUCTED TO TEST BLOOD SUGAR     Endocrinology: Diabetes - Testing Supplies Passed - 03/29/2024 11:40 AM      Passed - Valid encounter within last 12 months    Recent Outpatient Visits           5 months ago Controlled type 2 diabetes mellitus with complication, without long-term current use of insulin (HCC)   [place] [facility] Medicine Duanne Butler DASEN, MD   11 months ago Controlled type 2 diabetes mellitus with complication, without long-term current use of insulin [facility])   [place] [facility] Duanne Butler DASEN, MD   1 year ago Controlled type 2 diabetes mellitus with complication, without long-term current use of insulin [facility])   [place] [facility] Family Medicine Pickard, Butler DASEN, MD   2 years ago Essential hypertension   [place] [facility] Medicine Duanne Butler DASEN, MD       Future Appointments             In 1 week Johnson, Laymon HERO, PA-C [place] [facility] at [facility] [place] [facility]             Alcohol  Swabs  (DROPSAFE ALCOHOL  PREP) 70 % PADS 300 each 1    Sig: USE AS DIRECTED     Off-Protocol Failed - 03/29/2024 11:40 AM      Failed - Medication not assigned to a protocol, review manually.      Passed - Valid encounter within last 12 months    Recent Outpatient Visits           5 months ago Controlled type 2 diabetes mellitus with complication, without long-term current use of insulin (HCC)   [place] [facility] Pickard, Butler DASEN, MD   11 months ago Controlled type 2 diabetes mellitus with complication, without long-term current use of insulin [facility])   [facility] Duanne Butler DASEN, MD   1 year ago Controlled type 2 diabetes mellitus with complication, without long-term current use of insulin [facility])   [place] [facility] Family  Medicine Pickard, Butler DASEN, MD   2 years ago Essential hypertension   [place] [facility] Medicine Duanne Butler DASEN, MD       Future Appointments             In 1 week Johnson, Laymon HERO, PA-C [place] [facility] at [facility] [place] [facility]             levothyroxine  (SYNTHROID ) 100 MCG tablet 90 tablet 1    Sig: Take 1 tablet (100 mcg total) by mouth daily.     Endocrinology:  Hypothyroid Agents Passed - 03/29/2024 11:40 AM      Passed - TSH in normal range and within 360 days    TSH  Date Value Ref Range Status  10/12/2023 1.83 0.40 - 4.50 mIU/L Final         Passed - Valid encounter within last 12 months    Recent Outpatient Visits           5 months ago Controlled type 2 diabetes mellitus with complication, without long-term current use of insulin [facility])   [place] [facility] Duanne Butler DASEN, MD   11 months ago Controlled type 2 diabetes mellitus with complication, without  long-term current use of insulin (HCC)   [place] [facility] Pickard, Butler DASEN, MD   1 year ago Controlled type 2 diabetes mellitus with complication, without long-term current use of insulin [facility])   [place] [facility] Family Medicine Pickard, Butler DASEN, MD   2 years ago Essential hypertension   [place] [facility] Medicine Duanne Butler DASEN, MD       Future Appointments             In 1 week Johnson, Laymon HERO, PA-C [place] [facility] at [facility] [place] [facility]             ezetimibe  (ZETIA ) 10 MG tablet 90 tablet 1    Sig: Take 1 tablet (10 mg total) by mouth daily.     Cardiovascular:  Antilipid - Sterol Transport Inhibitors Failed - 03/29/2024 11:40 AM      Failed - Lipid Panel in normal range within the last 12 months    Cholesterol  Date Value Ref Range Status  10/12/2023 100 <200 mg/dL Final   LDL Cholesterol (Calc)  Date Value Ref Range Status  10/12/2023 41 mg/dL (calc) Final    Comment:     Reference range: <100 . Desirable range <100 mg/dL for primary prevention;   <70 mg/dL for patients with CHD or diabetic patients  with > or = 2 CHD risk factors. SABRA LDL-C is now calculated using the Martin-Hopkins  calculation, which is a validated novel method providing  better accuracy than the Friedewald equation in the  estimation of LDL-C.  Gladis APPLETHWAITE et al. SANDREA. 7986;689(80): 2061-2068  (http://education.[employer].com/faq/FAQ164)    HDL  Date Value Ref Range Status  10/12/2023 43 (L) > OR = 50 mg/dL Final   Triglycerides  Date Value Ref Range Status  10/12/2023 78 <150 mg/dL Final         Passed - AST in normal range and within 360 days    AST  Date Value Ref Range Status  10/12/2023 17 10 - 35 U/L Final         Passed - ALT in normal range and within 360 days    ALT  Date Value Ref Range Status  10/12/2023 12 6 - 29 U/L Final         Passed - Patient is not pregnant      Passed - Valid encounter within last 12 months    Recent Outpatient Visits           5 months ago Controlled type 2 diabetes mellitus with complication, without long-term current use of insulin (HCC)   [place] [facility] Medicine Pickard, Butler DASEN, MD   11 months ago Controlled type 2 diabetes mellitus with complication, without long-term current use of insulin (HCC)   [place] [facility] Duanne Butler DASEN, MD   1 year ago Controlled type 2 diabetes mellitus with complication, without long-term current use of insulin [facility])   [place] [facility] Medicine Pickard, Butler DASEN, MD   2 years ago Essential hypertension   [place] [facility] Family Medicine Pickard, Butler DASEN, MD       Future Appointments             In 1 week Johnson, Laymon HERO, PA-C [place] [facility] [place] [facility]             amLODipine  (NORVASC ) 10 MG tablet 90 tablet 0  Sig: Take 1 tablet (10 mg total) by mouth daily.     Cardiovascular:  Calcium  Channel Blockers 2 Passed - 03/29/2024 11:40 AM      Passed - Last BP in normal range    BP Readings from Last 1 Encounters:  10/20/23 120/62         Passed - Last Heart Rate in normal range    Pulse Readings from Last 1 Encounters:  10/20/23 (!) 52         Passed - Valid encounter within last 6 months    Recent Outpatient Visits           5 months ago Controlled type 2 diabetes mellitus with complication, without long-term current use of insulin (HCC)   [place] [facility] Duanne Butler DASEN, MD   11 months ago Controlled type 2 diabetes mellitus with complication, without long-term current use of insulin [facility])   [place] [facility] Medicine Duanne Butler DASEN, MD   1 year ago Controlled type 2 diabetes mellitus with complication, without long-term current use of insulin [facility])   [place] [facility] Medicine Pickard, Butler DASEN, MD   2 years ago Essential hypertension   [place] [facility] Medicine Pickard, Butler DASEN, MD       Future Appointments             In 1 week Strader, Laymon HERO, PA-C  [facility] at [facility] [place] [facility]

## 2024-04-02 ENCOUNTER — Other Ambulatory Visit (HOSPITAL_COMMUNITY): Payer: Self-pay

## 2024-04-02 ENCOUNTER — Telehealth: Payer: Self-pay | Admitting: Family Medicine

## 2024-04-02 NOTE — Telephone Encounter (Signed)
 Copied from CRM #[id number]. Topic: Clinical - Medication Question >> Apr 02, 2024  4:25 PM Avram MATSU wrote: Reason for CRM: patient is calling wondering if she can have her test strips sent to [facility]   [place] - [facility] [address] [place] [address] Phone: [phone] Fax: [phone]

## 2024-04-03 ENCOUNTER — Other Ambulatory Visit: Payer: Self-pay

## 2024-04-03 DIAGNOSIS — E118 Type 2 diabetes mellitus with unspecified complications: Secondary | ICD-10-CM

## 2024-04-03 MED ORDER — GLUCOSE BLOOD VI STRP
ORAL_STRIP | 12 refills | Status: AC
  Filled 2024-04-03: qty 100, 25d supply, fill #0
  Filled 2024-04-24: qty 100, 25d supply, fill #1
  Filled 2024-05-19: qty 100, 25d supply, fill #2
  Filled 2024-06-14 – 2024-06-15 (×2): qty 100, 25d supply, fill #3

## 2024-04-04 ENCOUNTER — Other Ambulatory Visit (HOSPITAL_COMMUNITY): Payer: Self-pay

## 2024-04-04 MED FILL — Rosuvastatin Calcium Tab 20 MG: ORAL | 90 days supply | Qty: 90 | Fill #2 | Status: AC

## 2024-04-09 ENCOUNTER — Other Ambulatory Visit (HOSPITAL_BASED_OUTPATIENT_CLINIC_OR_DEPARTMENT_OTHER): Payer: Self-pay

## 2024-04-10 ENCOUNTER — Other Ambulatory Visit: Payer: Self-pay | Admitting: Family Medicine

## 2024-04-10 NOTE — Progress Notes (Signed)
 Pharmacy Quality Measure Review  This patient is appearing on a report for being at risk of failing the adherence measure for hypertension (ACEi/ARB) medications this calendar year.   Medication: losartan  100 mg Last fill date: 01/18/24 for 90 day supply  Insurance report was not up to date. No action needed at this time. , patient will be due on 04/17/24, and Will collaborate with embedded pharmacist to follow up in a few weeks to see if patient refilled.  Jenkins Graces, PharmD PGY1 Pharmacy Resident [phone]

## 2024-04-11 ENCOUNTER — Encounter: Payer: Self-pay | Admitting: Student

## 2024-04-11 ENCOUNTER — Ambulatory Visit: Attending: Student | Admitting: Student

## 2024-04-11 VITALS — BP 114/66 | HR 53 | Ht 66.0 in | Wt 165.0 lb

## 2024-04-11 DIAGNOSIS — I10 Essential (primary) hypertension: Secondary | ICD-10-CM | POA: Diagnosis not present

## 2024-04-11 DIAGNOSIS — I2510 Atherosclerotic heart disease of native coronary artery without angina pectoris: Secondary | ICD-10-CM | POA: Diagnosis not present

## 2024-04-11 DIAGNOSIS — E782 Mixed hyperlipidemia: Secondary | ICD-10-CM | POA: Diagnosis not present

## 2024-04-11 DIAGNOSIS — I5020 Unspecified systolic (congestive) heart failure: Secondary | ICD-10-CM | POA: Diagnosis not present

## 2024-04-11 NOTE — Patient Instructions (Signed)

## 2024-04-11 NOTE — Progress Notes (Signed)
 Cardiology Office Note    Date:  04/11/2024  ID:  [name], DOB Mar 10, 1961, MRN [id number] Cardiologist: Alvan Carrier, MD Cardiology APP:  Johnson Laymon CHRISTELLA, PA-C { :  History of Present Illness:    [name] is a 63 y.o. 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   She was last examined by myself in 09/2023 and denied any recent anginal symptoms. No changes were made to her medications and she was continued on Amlodipine  10 mg daily, ASA 81 mg daily, Coreg  25 mg twice daily, Zetia  10 mg daily, Lasix  20 mg daily, Losartan  100 mg daily and Crestor  20 mg daily.  In talking with the patient today, she reports overall doing well since her last office visit. She does not exercise routinely but says she stays active around the house by frequently cleaning and is able to go to the grocery store or [employer] and walk around the store without any chest pain or dyspnea on exertion. No recent palpitations, orthopnea, PND or pitting edema. Her average heart rate is in the 50's to 60's when checked at home and she denies any associated lightheadedness, dizziness or presyncope. She does most of her own cooking and does not routinely add extra salt to food. Rarely eats fast food or takeout from restaurants.  Studies Reviewed:   EKG: EKG is not ordered today.  Echocardiogram: 10/2013 Study Conclusions   - Left ventricle: The cavity size was normal. There was moderate    concentric hypertrophy. Systolic function was normal. The    estimated ejection fraction was in the range of 55% to 60%. Wall    motion was normal; there were no regional wall motion    abnormalities. Left ventricular diastolic function parameters    were normal.   - Left atrium: The atrium was mildly dilated.  - Tricuspid valve: There was trivial regurgitation.  - Inferior vena cava: The vessel was normal in size. The    respirophasic diameter changes were in the normal range (= 50%),    consistent with normal central venous pressure.    Physical Exam:   VS:  BP 114/66   Pulse (!) 53   Ht 5' 6 (1.676 m)   Wt 165 lb (74.8 kg)   SpO2 95%   BMI 26.63 kg/m    Wt Readings from Last 3 Encounters:  04/11/24 165 lb (74.8 kg)  02/15/24 170 lb (77.1 kg)  10/20/23 170 lb 12.8 oz (77.5 kg)     GEN: Pleasant female appearing in no acute distress NECK: No JVD; No carotid bruits CARDIAC: Regular rhythm, bradycardiac rate. No murmurs, rubs, gallops RESPIRATORY:  Clear to auscultation without rales, wheezing or rhonchi  ABDOMEN: Appears non-distended. No obvious abdominal masses. EXTREMITIES: No clubbing or cyanosis. No pitting edema.  Distal pedal pulses are 2+ bilaterally.   Assessment and Plan:   1. Coronary artery disease involving native coronary artery of native heart without angina pectoris - Cardiac catheterization in 2009 showed a CTO of the RCA and medical management was recommended. While she does not exercise routinely, she denies any anginal symptoms when doing routine chores around her home. We reviewed warning signs to monitor for. - Continue current medical therapy with ASA 81 mg daily, Coreg   25 mg twice daily, Losartan  100 mg daily, Zetia  10 mg daily and Crestor  20 mg daily.  2. Heart failure with improved ejection fraction (HFimpEF) (HCC) - Her EF was previously 25% in 2009 and had normalized by repeat imaging, at 55-60% by echo in 2015. She appears euvolemic by examination today and denies any recent respiratory issues. Encouraged her to make us  aware of any change in symptoms as we could arrange for a follow-up echocardiogram. - Continue current GDMT with Coreg  25mg  BID, Losartan  100mg  daily and Lasix  20mg  daily. Will hold off on  adding an SGLT2 inhibitor at this time given no recent respiratory or volume issues.   3. Essential hypertension - BP was initially recorded at 140/72 during today's visit, rechecked and improved to 114/66. Continue current medical therapy with Amlodipine  10 mg daily, Coreg  25 mg twice daily and Losartan  100 mg daily. We reviewed that if her heart rate were to drop into the 40's or she developed associated dizziness or presyncope, to make us  aware as Coreg  dosing would need to be reduced.  4. Mixed hyperlipidemia - FLP in 09/2023 showed total cholesterol 100, HDL 43, triglycerides 78 and LDL 41. Continue current medical therapy with Zetia  10 mg daily and Crestor  20 mg daily. She is due for follow-up labs with her PCP later this week.  5. Peripheral arterial disease - Was noted to have moderately decreased left ABI by prior studies in 04/2019. She denies any specific claudication symptoms.  Continue ASA, Zetia  and Crestor .   Signed, Laymon [name], PA-C

## 2024-04-12 ENCOUNTER — Other Ambulatory Visit (HOSPITAL_COMMUNITY): Payer: Self-pay

## 2024-04-12 MED ORDER — LOSARTAN POTASSIUM 100 MG PO TABS
100.0000 mg | ORAL_TABLET | Freq: Every day | ORAL | 0 refills | Status: DC
  Filled 2024-04-12: qty 90, 90d supply, fill #0

## 2024-04-12 NOTE — Telephone Encounter (Signed)
 Requested Prescriptions  Pending Prescriptions Disp Refills   losartan  (COZAAR ) 100 MG tablet 90 tablet 0    Sig: Take 1 tablet (100 mg total) by mouth daily.     Cardiovascular:  Angiotensin Receptor Blockers Failed - 04/12/2024 12:30 PM      Failed - Cr in normal range and within 180 days    Creat  Date Value Ref Range Status  10/12/2023 0.62 0.50 - 1.05 mg/dL Final   Creatinine,U  Date Value Ref Range Status  09/12/2007   Final   28.8 (NOTE)  Cutoff Values for Urine Drug Screen:        Drug Class           Cutoff (ng/mL)        Amphetamines            1000        Barbiturates             200        Cocaine Metabolites      300        Benzodiazepines          200        Methadone                 300        Opiates                 2000        Phencyclidine             25        Propoxyphene             300        Marijuana Metabolites     50  For medical purposes only.   Creatinine, Urine  Date Value Ref Range Status  04/13/2023 5 (L) 20 - 275 mg/dL Final         Failed - K in normal range and within 180 days    Potassium  Date Value Ref Range Status  10/12/2023 4.6 3.5 - 5.3 mmol/L Final         Passed - Patient is not pregnant      Passed - Last BP in normal range    BP Readings from Last 1 Encounters:  04/11/24 114/66         Passed - Valid encounter within last 6 months    Recent Outpatient Visits           5 months ago Controlled type 2 diabetes mellitus with complication, without long-term current use of insulin (HCC)   [place] [facility] Medicine Duanne Butler DASEN, MD   11 months ago Controlled type 2 diabetes mellitus with complication, without long-term current use of insulin [facility])   [place] [facility] Duanne Butler DASEN, MD   1 year ago Controlled type 2 diabetes mellitus with complication, without long-term current use of insulin [facility])   [place] [facility] Pickard, Butler DASEN, MD   2 years ago Essential  hypertension    [facility] Pickard, Butler DASEN, MD

## 2024-04-13 ENCOUNTER — Other Ambulatory Visit

## 2024-04-13 DIAGNOSIS — I2510 Atherosclerotic heart disease of native coronary artery without angina pectoris: Secondary | ICD-10-CM | POA: Diagnosis not present

## 2024-04-13 DIAGNOSIS — E538 Deficiency of other specified B group vitamins: Secondary | ICD-10-CM | POA: Diagnosis not present

## 2024-04-13 DIAGNOSIS — F17200 Nicotine dependence, unspecified, uncomplicated: Secondary | ICD-10-CM | POA: Diagnosis not present

## 2024-04-13 DIAGNOSIS — E118 Type 2 diabetes mellitus with unspecified complications: Secondary | ICD-10-CM | POA: Diagnosis not present

## 2024-04-13 DIAGNOSIS — E039 Hypothyroidism, unspecified: Secondary | ICD-10-CM

## 2024-04-13 DIAGNOSIS — I10 Essential (primary) hypertension: Secondary | ICD-10-CM | POA: Diagnosis not present

## 2024-04-14 LAB — COMPLETE METABOLIC PANEL WITHOUT GFR
AG Ratio: 1.6 (calc) (ref 1.0–2.5)
ALT: 10 U/L (ref 6–29)
AST: 16 U/L (ref 10–35)
Albumin: 4.3 g/dL (ref 3.6–5.1)
Alkaline phosphatase (APISO): 75 U/L (ref 37–153)
BUN: 17 mg/dL (ref 7–25)
CO2: 28 mmol/L (ref 20–32)
Calcium: 9.7 mg/dL (ref 8.6–10.4)
Chloride: 100 mmol/L (ref 98–110)
Creat: 0.71 mg/dL (ref 0.50–1.05)
Globulin: 2.7 g/dL (ref 1.9–3.7)
Glucose, Bld: 86 mg/dL (ref 65–99)
Potassium: 4.2 mmol/L (ref 3.5–5.3)
Sodium: 137 mmol/L (ref 135–146)
Total Bilirubin: 0.4 mg/dL (ref 0.2–1.2)
Total Protein: 7 g/dL (ref 6.1–8.1)

## 2024-04-14 LAB — LIPID PANEL
Cholesterol: 83 mg/dL (ref <200)
HDL: 36 mg/dL — ABNORMAL LOW (ref >=50)
LDL Cholesterol (Calc): 31 mg/dL
Non-HDL Cholesterol (Calc): 47 mg/dL (ref <130)
Total CHOL/HDL Ratio: 2.3 (calc) (ref <5.0)
Triglycerides: 82 mg/dL (ref <150)

## 2024-04-14 LAB — CBC WITH DIFFERENTIAL/PLATELET
Absolute Lymphocytes: 2075 {cells}/uL (ref 850–3900)
Absolute Monocytes: 457 {cells}/uL (ref 200–950)
Basophils Absolute: 83 {cells}/uL (ref 0–200)
Basophils Relative: 1 %
Eosinophils Absolute: 66 {cells}/uL (ref 15–500)
Eosinophils Relative: 0.8 %
HCT: 35.6 % (ref 35.0–45.0)
Hemoglobin: 11.4 g/dL — ABNORMAL LOW (ref 11.7–15.5)
MCH: 28.4 pg (ref 27.0–33.0)
MCHC: 32 g/dL (ref 32.0–36.0)
MCV: 88.6 fL (ref 80.0–100.0)
MPV: 10.4 fL (ref 7.5–12.5)
Monocytes Relative: 5.5 %
Neutro Abs: 5619 {cells}/uL (ref 1500–7800)
Neutrophils Relative %: 67.7 %
Platelets: 299 Thousand/uL (ref 140–400)
RBC: 4.02 Million/uL (ref 3.80–5.10)
RDW: 14.6 % (ref 11.0–15.0)
Total Lymphocyte: 25 %
WBC: 8.3 Thousand/uL (ref 3.8–10.8)

## 2024-04-14 LAB — TSH: TSH: 0.79 m[IU]/L (ref 0.40–4.50)

## 2024-04-14 LAB — HEMOGLOBIN A1C
Hgb A1c MFr Bld: 5.8 % — ABNORMAL HIGH (ref <5.7)
Mean Plasma Glucose: 120 mg/dL
eAG (mmol/L): 6.6 mmol/L

## 2024-04-14 LAB — MICROALBUMIN / CREATININE URINE RATIO
Creatinine, Urine: 21 mg/dL (ref 20–275)
Microalb Creat Ratio: 10 mg/g{creat} (ref <30)
Microalb, Ur: 0.2 mg/dL

## 2024-04-14 LAB — VITAMIN B12: Vitamin B-12: 871 pg/mL (ref 200–1100)

## 2024-04-16 ENCOUNTER — Ambulatory Visit: Payer: Self-pay | Admitting: Family Medicine

## 2024-04-19 ENCOUNTER — Ambulatory Visit: Admitting: Family Medicine

## 2024-04-19 ENCOUNTER — Encounter: Payer: Self-pay | Admitting: Family Medicine

## 2024-04-19 ENCOUNTER — Other Ambulatory Visit (HOSPITAL_COMMUNITY): Payer: Self-pay

## 2024-04-19 ENCOUNTER — Other Ambulatory Visit: Payer: Self-pay

## 2024-04-19 VITALS — BP 102/58 | HR 55 | Ht 66.0 in | Wt 155.6 lb

## 2024-04-19 DIAGNOSIS — Z23 Encounter for immunization: Secondary | ICD-10-CM

## 2024-04-19 DIAGNOSIS — E118 Type 2 diabetes mellitus with unspecified complications: Secondary | ICD-10-CM

## 2024-04-19 DIAGNOSIS — I2510 Atherosclerotic heart disease of native coronary artery without angina pectoris: Secondary | ICD-10-CM | POA: Diagnosis not present

## 2024-04-19 DIAGNOSIS — E039 Hypothyroidism, unspecified: Secondary | ICD-10-CM

## 2024-04-19 DIAGNOSIS — I152 Hypertension secondary to endocrine disorders: Secondary | ICD-10-CM

## 2024-04-19 DIAGNOSIS — I10 Essential (primary) hypertension: Secondary | ICD-10-CM

## 2024-04-19 DIAGNOSIS — E1159 Type 2 diabetes mellitus with other circulatory complications: Secondary | ICD-10-CM

## 2024-04-19 MED ORDER — FUROSEMIDE 20 MG PO TABS
20.0000 mg | ORAL_TABLET | Freq: Every day | ORAL | 0 refills | Status: AC
Start: 2024-04-19 — End: ?
  Filled 2024-04-19: qty 90, 90d supply, fill #0

## 2024-04-19 MED ORDER — LEVOTHYROXINE SODIUM 100 MCG PO TABS
100.0000 ug | ORAL_TABLET | Freq: Every day | ORAL | 1 refills | Status: AC
  Filled 2024-04-19: qty 90, 90d supply, fill #0
  Filled 2024-06-07: qty 30, 30d supply, fill #0

## 2024-04-19 MED ORDER — LOSARTAN POTASSIUM 100 MG PO TABS
100.0000 mg | ORAL_TABLET | Freq: Every day | ORAL | 0 refills | Status: AC
Start: 2024-04-19 — End: ?
  Filled 2024-04-19: qty 90, 90d supply, fill #0

## 2024-04-19 MED ORDER — ROSUVASTATIN CALCIUM 20 MG PO TABS
20.0000 mg | ORAL_TABLET | Freq: Every day | ORAL | 3 refills | Status: AC
  Filled 2024-04-19: qty 90, 90d supply, fill #0

## 2024-04-19 MED ORDER — CARVEDILOL 25 MG PO TABS
25.0000 mg | ORAL_TABLET | Freq: Two times a day (BID) | ORAL | 0 refills | Status: AC
  Filled 2024-04-19: qty 180, 90d supply, fill #0

## 2024-04-19 MED ORDER — AMLODIPINE BESYLATE 10 MG PO TABS
10.0000 mg | ORAL_TABLET | Freq: Every day | ORAL | 0 refills | Status: AC
  Filled 2024-04-19: qty 90, 90d supply, fill #0

## 2024-04-19 MED ORDER — METFORMIN HCL 1000 MG PO TABS
1000.0000 mg | ORAL_TABLET | Freq: Two times a day (BID) | ORAL | 0 refills | Status: AC
  Filled 2024-04-19: qty 180, 90d supply, fill #0

## 2024-04-19 MED ORDER — ASPIRIN 81 MG PO TBEC
81.0000 mg | DELAYED_RELEASE_TABLET | Freq: Every day | ORAL | 3 refills | Status: AC
Start: 2024-04-19 — End: ?
  Filled 2024-04-19: qty 90, 90d supply, fill #0

## 2024-04-19 MED ORDER — OMEPRAZOLE 40 MG PO CPDR
40.0000 mg | DELAYED_RELEASE_CAPSULE | Freq: Every day | ORAL | 0 refills | Status: AC
  Filled 2024-04-19: qty 90, 90d supply, fill #0

## 2024-04-19 MED ORDER — EZETIMIBE 10 MG PO TABS
10.0000 mg | ORAL_TABLET | Freq: Every day | ORAL | 1 refills | Status: AC
  Filled 2024-04-19 – 2024-06-07 (×2): qty 90, 90d supply, fill #0

## 2024-04-19 NOTE — Addendum Note (Signed)
 Addended by: ANGELENA RONAL BRADLEY K on: 04/19/2024 11:06 AM   Modules accepted: Orders

## 2024-04-19 NOTE — Progress Notes (Signed)
 Pharmacy Quality Measure Review  This patient is appearing on a report for being at risk of failing the adherence measure for hypertension (ACEi/ARB) medications this calendar year.   Medication: losartan  100 mg Last fill date: 04/12/24 for 90 day supply  Insurance report was not up to date. No action needed at this time.   Jenkins Graces, PharmD PGY1 Pharmacy Resident [phone]

## 2024-04-19 NOTE — Progress Notes (Addendum)
 Wt Readings from Last 3 Encounters:  04/19/24 155 lb 9.6 oz (70.6 kg)  04/11/24 165 lb (74.8 kg)  02/15/24 170 lb (77.1 kg)     Subjective:    Patient ID: [name], female    DOB: Aug 15, 1960, 63 y.o.   MRN: [id number]  Patient is a very sweet 63 year old Caucasian female who is here today for a checkup.  Past medical history is significant for type 2 diabetes, hypertension, hyperlipidemia, and coronary artery disease.  She denies any chest pain.  She does have some shortness of breath with activity but this is chronic.  Her blood pressure today is actually low at 102/58.  She denies any dizziness or syncope she has also lost approximately 15 pounds.  She states that her appetite has been less and she is not eating as much.  Her mammogram was just performed and was clear.  The patient had a colonoscopy last year that was negative for any malignancy.  She had a CT scan of the chest in October that showed no evidence of lung cancer.  She states that she feels fine.  She is due for a flu shot today.  Her pneumonia vaccine is up-to-date until after age 96.  Her most recent lab work is listed below Lab on 04/13/2024  Component Date Value Ref Range Status   WBC 04/13/2024 8.3  3.8 - 10.8 Thousand/uL Final   RBC 04/13/2024 4.02  3.80 - 5.10 Million/uL Final   Hemoglobin 04/13/2024 11.4 (L)  11.7 - 15.5 g/dL Final   HCT 88/85/7974 35.6  35.0 - 45.0 % Final   MCV 04/13/2024 88.6  80.0 - 100.0 fL Final   MCH 04/13/2024 28.4  27.0 - 33.0 pg Final   MCHC 04/13/2024 32.0  32.0 - 36.0 g/dL Final   Comment: For adults, a slight decrease in the calculated MCHC value (in the range of 30 to 32 g/dL) is most likely not clinically significant; however, it should be interpreted with caution in correlation with other red cell parameters and the patient's clinical condition.    RDW 04/13/2024 14.6  11.0 - 15.0 % Final   Platelets 04/13/2024 299  140 - 400 Thousand/uL Final   MPV 04/13/2024 10.4  7.5 -  12.5 fL Final   Neutro Abs 04/13/2024 5,619  1,500 - 7,800 cells/uL Final   Absolute Lymphocytes 04/13/2024 2,075  850 - 3,900 cells/uL Final   Absolute Monocytes 04/13/2024 457  200 - 950 cells/uL Final   Eosinophils Absolute 04/13/2024 66  15 - 500 cells/uL Final   Basophils Absolute 04/13/2024 83  0 - 200 cells/uL Final   Neutrophils Relative % 04/13/2024 67.7  % Final   Total Lymphocyte 04/13/2024 25.0  % Final   Monocytes Relative 04/13/2024 5.5  % Final   Eosinophils Relative 04/13/2024 0.8  % Final   Basophils Relative 04/13/2024 1.0  % Final   Glucose, Bld 04/13/2024 86  65 - 99 mg/dL Final   Comment: .            Fasting reference interval .    BUN 04/13/2024 17  7 - 25 mg/dL Final   Creat 88/85/7974 0.71  0.50 - 1.05 mg/dL Final   BUN/Creatinine Ratio 04/13/2024 SEE NOTE:  6 - 22 (calc) Final   Comment:    Not Reported: BUN and Creatinine are within    reference range. .    Sodium 04/13/2024 137  135 - 146 mmol/L Final   Potassium 04/13/2024 4.2  3.5 -  5.3 mmol/L Final   Chloride 04/13/2024 100  98 - 110 mmol/L Final   CO2 04/13/2024 28  20 - 32 mmol/L Final   Calcium  04/13/2024 9.7  8.6 - 10.4 mg/dL Final   Total Protein 88/85/7974 7.0  6.1 - 8.1 g/dL Final   Albumin 88/85/7974 4.3  3.6 - 5.1 g/dL Final   Globulin 88/85/7974 2.7  1.9 - 3.7 g/dL (calc) Final   AG Ratio 04/13/2024 1.6  1.0 - 2.5 (calc) Final   Total Bilirubin 04/13/2024 0.4  0.2 - 1.2 mg/dL Final   Alkaline phosphatase (APISO) 04/13/2024 75  37 - 153 U/L Final   AST 04/13/2024 16  10 - 35 U/L Final   ALT 04/13/2024 10  6 - 29 U/L Final   Hgb A1c MFr Bld 04/13/2024 5.8 (H)  <5.7 % Final   Comment: For someone without known diabetes, a hemoglobin  A1c value between 5.7% and 6.4% is consistent with prediabetes and should be confirmed with a  follow-up test. . For someone with known diabetes, a value <7% indicates that their diabetes is well controlled. A1c targets should be individualized based on  duration of diabetes, age, comorbid conditions, and other considerations. . This assay result is consistent with an increased risk of diabetes. . Currently, no consensus exists regarding use of hemoglobin A1c for diagnosis of diabetes for children. .    Mean Plasma Glucose 04/13/2024 120  mg/dL Final   eAG (mmol/L) 88/85/7974 6.6  mmol/L Final   Creatinine, Urine 04/13/2024 21  20 - 275 mg/dL Final   Microalb, Ur 88/85/7974 0.2  mg/dL Final   Comment: Reference Range Not established    Microalb Creat Ratio 04/13/2024 10  <30 mg/g creat Final   Comment: . The ADA defines abnormalities in albumin excretion as follows: SABRA Albuminuria Category        Result (mg/g creatinine) . Normal to Mildly increased   <30 Moderately increased         30-299  Severely increased           > OR = 300 . The ADA recommends that at least two of three specimens collected within a 3-6 month period be abnormal before considering a patient to be within a diagnostic category.    Vitamin B-12 04/13/2024 871  200 - 1,100 pg/mL Final   Cholesterol 04/13/2024 83  <200 mg/dL Final   HDL 88/85/7974 36 (L)  > OR = 50 mg/dL Final   Triglycerides 88/85/7974 82  <150 mg/dL Final   LDL Cholesterol (Calc) 04/13/2024 31  mg/dL (calc) Final   Comment: Reference range: <100 . Desirable range <100 mg/dL for primary prevention;   <70 mg/dL for patients with CHD or diabetic patients  with > or = 2 CHD risk factors. SABRA LDL-C is now calculated using the Martin-Hopkins  calculation, which is a validated novel method providing  better accuracy than the Friedewald equation in the  estimation of LDL-C.  Gladis APPLETHWAITE et al. SANDREA. 7986;689(80): 2061-2068  (http://education.[facility].com/faq/FAQ164)    Total CHOL/HDL Ratio 04/13/2024 2.3  <4.9 (calc) Final   Non-HDL Cholesterol (Calc) 04/13/2024 47  <130 mg/dL (calc) Final   Comment: For patients with diabetes plus 1 major ASCVD risk  factor, treating to a  non-HDL-C goal of <100 mg/dL  (LDL-C of <29 mg/dL) is considered a therapeutic  option.    TSH 04/13/2024 0.79  0.40 - 4.50 mIU/L Final    Past Medical History:  Diagnosis Date   Anemia  Arthritis    knees   Blood transfusion 08/2007   CHF (congestive heart failure) (HCC)    Claudication    Coronary artery disease    a. prior cardiac catheterization 2009 showing CTO of RCA   Diabetes mellitus    GERD (gastroesophageal reflux disease)    Hyperlipidemia    Hypertension    Hypothyroidism    PVD (peripheral vascular disease)    PVD (peripheral vascular disease)    Sleep apnea    does not wear CPAP   Tobacco abuse    Past Surgical History:  Procedure Laterality Date   COLONOSCOPY  08/2011   normal DP at [facility]   COLONOSCOPY WITH PROPOFOL   12/09/2022   CD at [facility]   Current Outpatient Medications on File Prior to Visit  Medication Sig Dispense Refill   Accu-Chek Softclix Lancets lancets USE AS INSTRUCTED TO TEST BLOOD SUGAR 300 each 1   Alcohol  Swabs  (DROPSAFE ALCOHOL  PREP) 70 % PADS USE AS DIRECTED 300 each 1   Cyanocobalamin  (VITAMIN B-12 CR PO) Take by mouth.     glucose blood test strip Use as instructed to check blood sugars up to 4 times per day. 100 each 12   nitroGLYCERIN  (NITROSTAT ) 0.4 MG SL tablet DISSOLVE 1 TABLET UNDER THE TONGUE EVERY 5 MINUTES AS NEEDED AS DIRECTED 100 tablet 3   Omega-3 Fatty Acids (FISH OIL) 1000 MG CAPS Take by mouth.     No current facility-administered medications on file prior to visit.   No Known Allergies Social History   Socioeconomic History   Marital status: Legally Separated    Spouse name: Not on file   Number of children: 0   Years of education: Not on file   Highest education level: Not on file  Occupational History   Not on file  Tobacco Use   Smoking status: Every Day    Current packs/day: 0.50    Average packs/day: 0.5 packs/day for 32.0 years (16.0 ttl pk-yrs)    Types: Cigarettes   Smokeless tobacco: Never   Vaping Use   Vaping status: Never Used  Substance and Sexual Activity   Alcohol  use: No   Drug use: No   Sexual activity: Yes    Birth control/protection: Post-menopausal  Other Topics Concern   Not on file  Social History Narrative   Not on file   Social Drivers of Health   Financial Resource Strain: Low Risk  (02/15/2024)   Overall Financial Resource Strain (CARDIA)    Difficulty of Paying Living Expenses: Not hard at all  Food Insecurity: Patient Declined (02/15/2024)   Hunger Vital Sign    Worried About Running Out of Food in the Last Year: Patient declined    Ran Out of Food in the Last Year: Patient declined  Transportation Needs: No Transportation Needs (02/15/2024)   PRAPARE - Administrator, Civil Service (Medical): No    Lack of Transportation (Non-Medical): No  Physical Activity: Unknown (02/15/2024)   Exercise Vital Sign    Days of Exercise per Week: 3 days    Minutes of Exercise per Session: Not on file  Stress: No Stress Concern Present (02/15/2024)   [employer] of Occupational Health - Occupational Stress Questionnaire    Feeling of Stress: Only a little  Social Connections: Patient Declined (02/15/2024)   Social Connection and Isolation Panel    Frequency of Communication with Friends and Family: Patient declined    Frequency of Social Gatherings with Friends and Family: Patient declined  Attends Religious Services: Patient declined    Active Member of Clubs or Organizations: Patient declined    Attends Banker Meetings: Patient declined    Marital Status: Patient declined  Intimate Partner Violence: Not At Risk (02/15/2024)   Humiliation, Afraid, Rape, and Kick questionnaire    Fear of Current or Ex-Partner: No    Emotionally Abused: No    Physically Abused: No    Sexually Abused: No     Review of Systems  All other systems reviewed and are negative.      Objective:   Physical Exam Vitals reviewed.  Constitutional:       General: She is not in acute distress.    Appearance: She is well-developed. She is not diaphoretic.  HENT:     Mouth/Throat:     Pharynx: No oropharyngeal exudate.  Eyes:     General: No scleral icterus.    Conjunctiva/sclera: Conjunctivae normal.  Neck:     Thyroid : No thyromegaly.     Vascular: No JVD.  Cardiovascular:     Rate and Rhythm: Normal rate and regular rhythm.     Heart sounds: No murmur heard.    No friction rub. No gallop.  Pulmonary:     Effort: Pulmonary effort is normal. No respiratory distress.     Breath sounds: No wheezing or rales.  Abdominal:     General: Bowel sounds are normal. There is no distension.     Palpations: Abdomen is soft.     Tenderness: There is no abdominal tenderness. There is no guarding or rebound.  Musculoskeletal:     Cervical back: Neck supple.  Lymphadenopathy:     Cervical: No cervical adenopathy.     Assessment & Plan:  Hypertension associated with type 2 diabetes mellitus (HCC)  Hypothyroidism, unspecified type  Essential hypertension  Coronary artery disease involving native coronary artery of native heart without angina pectoris - Plan: rosuvastatin  (CRESTOR ) 20 MG tablet  Flu vaccine need - Plan: Flu vaccine HIGH DOSE PF(Fluzone Trivalent)   Colon cancer screening was completed in 2024.  Breast cancer screening was completed in October.  Lung cancer screening was completed in October.  Therefore I believe her cancer screening is up-to-date.  her blood pressure today is slightly low.  I recommended that she check her blood pressure daily and report the values to me.  If persistently less than 140/90, we could decrease amlodipine .  Her hemoglobin A1c is outstanding at 5.8.  Her LDL cholesterol is outstanding at 31.  I would like to see her LDL cholesterol less than 55 given her history of coronary artery disease.  Her urine microalbumin to creatinine ratio was 10 which is outstanding.  Her lab work looked great.  Her TSH  was within normal limits for her hypothyroidism.  My only concern is her smoking and her weight loss.  I encouraged her to try to eat more protein and healthy fruits and vegetables.  Also recommended smoking cessation

## 2024-04-20 ENCOUNTER — Other Ambulatory Visit: Payer: Self-pay

## 2024-04-23 ENCOUNTER — Other Ambulatory Visit (HOSPITAL_COMMUNITY): Payer: Self-pay

## 2024-04-24 ENCOUNTER — Other Ambulatory Visit (HOSPITAL_COMMUNITY): Payer: Self-pay

## 2024-05-02 ENCOUNTER — Other Ambulatory Visit (HOSPITAL_COMMUNITY): Payer: Self-pay

## 2024-05-11 NOTE — Progress Notes (Signed)
° °  05/11/2024  Patient ID: [name], female   DOB: 04-27-1961, 62 y.o.   MRN: [id number]  Pharmacy Quality Measure Review  This patient is appearing on a report for being at risk of failing the adherence measure for hypertension (ACEi/ARB) medications this calendar year.   Medication: losartan  Last fill date: 04/12/24 for 90 day supply  Insurance report was not up to date. No action needed at this time.   Lang Sieve, PharmD, BCGP Clinical Pharmacist  [phone]

## 2024-05-21 ENCOUNTER — Other Ambulatory Visit (HOSPITAL_COMMUNITY): Payer: Self-pay

## 2024-05-21 NOTE — Progress Notes (Signed)
 Pharmacy Quality Measure Review  This patient is appearing on a report for being at risk of failing the adherence measure for diabetes medications this calendar year.   Medication: Metformin  1,000 mg Last fill date: 04/20/24 for 90 day supply  Insurance report was not up to date. No action needed at this time.   Jenkins Graces, PharmD PGY1 Pharmacy Resident

## 2024-06-07 ENCOUNTER — Other Ambulatory Visit: Payer: Self-pay

## 2024-06-07 ENCOUNTER — Other Ambulatory Visit (HOSPITAL_COMMUNITY): Payer: Self-pay

## 2024-06-14 ENCOUNTER — Telehealth (HOSPITAL_COMMUNITY): Payer: Self-pay

## 2024-06-14 ENCOUNTER — Other Ambulatory Visit (HOSPITAL_COMMUNITY): Payer: Self-pay

## 2024-06-14 ENCOUNTER — Other Ambulatory Visit: Payer: Self-pay

## 2024-06-14 NOTE — Telephone Encounter (Signed)
 Pharmacy Patient Advocate Encounter   Received notification from Pt Calls Messages that prior authorization for OneTouch Verio strips  is required/requested.   Insurance verification completed.   The patient is insured through [facility] ADVANTAGE/RX ADVANCE.   Per test claim: Per test claim, medication is not covered due to plan/benefit exclusion, PA not submitted at this time  *Accu Chek Guide or Contour are preferred. One Touch is no longer on the formulary. Please send in new RX's for meter, strips and lancets for one of the preferred items.

## 2024-06-15 ENCOUNTER — Other Ambulatory Visit (HOSPITAL_COMMUNITY): Payer: Self-pay

## 2024-06-15 ENCOUNTER — Other Ambulatory Visit: Payer: Self-pay

## 2024-06-15 DIAGNOSIS — E118 Type 2 diabetes mellitus with unspecified complications: Secondary | ICD-10-CM

## 2024-06-23 ENCOUNTER — Other Ambulatory Visit (HOSPITAL_COMMUNITY): Payer: Self-pay

## 2024-06-23 ENCOUNTER — Other Ambulatory Visit: Payer: Self-pay | Admitting: Family Medicine

## 2024-06-25 NOTE — Telephone Encounter (Signed)
 Requested medication (s) are due for refill today: Yes  Requested medication (s) are on the active medication list: No  Last refill:  03/29/24  Future visit scheduled: Yes  Notes to clinic:  Pt. Asking for One Touch instead of Accu check.    Requested Prescriptions  Pending Prescriptions Disp Refills   Accu-Chek Softclix Lancets lancets 300 each 3    Sig: USE AS INSTRUCTED TO TEST BLOOD SUGAR     Endocrinology: Diabetes - Testing Supplies Passed - 06/25/2024 12:09 PM      Passed - Valid encounter within last 12 months    Recent Outpatient Visits           2 months ago Hypertension associated with type 2 diabetes mellitus (HCC)   [place] [facility] Duanne Butler DASEN, MD   8 months ago Controlled type 2 diabetes mellitus with complication, without long-term current use of insulin [facility])   [place] [facility] Duanne Butler DASEN, MD   1 year ago Controlled type 2 diabetes mellitus with complication, without long-term current use of insulin [facility])   [place] [facility] Duanne Butler DASEN, MD   1 year ago Controlled type 2 diabetes mellitus with complication, without long-term current use of insulin [facility])   [place] [facility] Pickard, Butler DASEN, MD   2 years ago Essential hypertension   [place] [facility] Pickard, Butler DASEN, MD

## 2024-06-26 ENCOUNTER — Other Ambulatory Visit: Payer: Self-pay

## 2024-06-26 ENCOUNTER — Other Ambulatory Visit (HOSPITAL_COMMUNITY): Payer: Self-pay

## 2024-06-26 MED ORDER — ONETOUCH ULTRASOFT LANCETS MISC
12 refills | Status: AC
  Filled 2024-06-26: qty 100, 90d supply, fill #0

## 2024-06-27 ENCOUNTER — Other Ambulatory Visit: Payer: Self-pay

## 2024-06-27 ENCOUNTER — Other Ambulatory Visit (HOSPITAL_COMMUNITY): Payer: Self-pay

## 2024-06-28 ENCOUNTER — Other Ambulatory Visit: Payer: Self-pay | Admitting: Family Medicine

## 2024-06-28 ENCOUNTER — Other Ambulatory Visit: Payer: Self-pay

## 2024-06-28 ENCOUNTER — Other Ambulatory Visit (HOSPITAL_COMMUNITY): Payer: Self-pay

## 2024-06-28 MED ORDER — LANCETS MISC
1.0000 | 0 refills | Status: AC
  Filled 2024-06-28: qty 100, 25d supply, fill #0

## 2024-06-28 MED ORDER — BLOOD GLUCOSE TEST VI STRP
1.0000 | ORAL_STRIP | 0 refills | Status: AC
  Filled 2024-06-28: qty 100, 25d supply, fill #0

## 2024-06-28 MED ORDER — BLOOD GLUCOSE MONITOR SYSTEM W/DEVICE KIT
1.0000 | PACK | 0 refills | Status: AC
  Filled 2024-06-28: qty 1, 1d supply, fill #0

## 2024-06-28 MED ORDER — LANCET DEVICE MISC
1.0000 | 0 refills | Status: AC
  Filled 2024-06-28: qty 1, fill #0

## 2024-06-29 ENCOUNTER — Other Ambulatory Visit: Payer: Self-pay | Admitting: Family Medicine

## 2024-06-29 NOTE — Telephone Encounter (Unsigned)
 Copied from CRM #[id number]. Topic: Clinical - Prescription Issue >> Jun 29, 2024 10:58 AM Wess RAMAN wrote: Reason for CRM: Patient would like to speak to Dr. Clara nurse in regards to getting lancets for [employer] Flex  Callback #: [id number]  Pharmacy: [facility] - [facility] [address] [place] [address] Phone: [phone] Fax: [phone] Hours: Mon-Fri 7:30a-7p; Sat 8a-4:30p; [place] 10a-2p

## 2024-07-06 ENCOUNTER — Other Ambulatory Visit (HOSPITAL_COMMUNITY): Payer: Self-pay

## 2025-02-20 ENCOUNTER — Ambulatory Visit
